# Patient Record
Sex: Male | Born: 1955
Health system: Southern US, Community
[De-identification: ages and names within clinical notes are randomized; demographics above are authoritative.]

## PROBLEM LIST (undated history)

## (undated) DIAGNOSIS — R42 Dizziness and giddiness: Secondary | ICD-10-CM

## (undated) DIAGNOSIS — C801 Malignant (primary) neoplasm, unspecified: Secondary | ICD-10-CM

## (undated) DIAGNOSIS — R32 Unspecified urinary incontinence: Secondary | ICD-10-CM

## (undated) DIAGNOSIS — I10 Essential (primary) hypertension: Secondary | ICD-10-CM

## (undated) DIAGNOSIS — M4800 Spinal stenosis, site unspecified: Secondary | ICD-10-CM

## (undated) DIAGNOSIS — M4802 Spinal stenosis, cervical region: Secondary | ICD-10-CM

## (undated) DIAGNOSIS — M5010 Cervical disc disorder with radiculopathy, unspecified cervical region: Secondary | ICD-10-CM

## (undated) DIAGNOSIS — E785 Hyperlipidemia, unspecified: Secondary | ICD-10-CM

## (undated) DIAGNOSIS — E119 Type 2 diabetes mellitus without complications: Secondary | ICD-10-CM

## (undated) DIAGNOSIS — G4730 Sleep apnea, unspecified: Secondary | ICD-10-CM

## (undated) HISTORY — PX: UMBILICAL HERNIA REPAIR: SHX196

## (undated) HISTORY — DX: Type 2 diabetes mellitus without complications: E11.9

## (undated) HISTORY — PX: PROSTATE SURGERY: SHX751

## (undated) HISTORY — PX: VASECTOMY: SHX75

## (undated) HISTORY — PX: TONSILLECTOMY: SUR1361

## (undated) HISTORY — PX: OTHER SURGICAL HISTORY: SHX169

---

## 2001-06-16 HISTORY — PX: UMBILICAL HERNIA REPAIR: SHX196

## 2002-03-04 ENCOUNTER — Encounter: Payer: Self-pay | Admitting: General Surgery

## 2002-03-04 ENCOUNTER — Encounter: Admission: RE | Admit: 2002-03-04 | Discharge: 2002-03-04 | Payer: Self-pay | Admitting: General Surgery

## 2002-03-08 ENCOUNTER — Ambulatory Visit (HOSPITAL_BASED_OUTPATIENT_CLINIC_OR_DEPARTMENT_OTHER): Admission: RE | Admit: 2002-03-08 | Discharge: 2002-03-08 | Payer: Self-pay | Admitting: General Surgery

## 2011-08-19 ENCOUNTER — Other Ambulatory Visit: Payer: Self-pay | Admitting: Urology

## 2011-10-14 ENCOUNTER — Encounter (HOSPITAL_COMMUNITY): Payer: Self-pay

## 2011-10-14 ENCOUNTER — Encounter (HOSPITAL_COMMUNITY)
Admission: RE | Admit: 2011-10-14 | Discharge: 2011-10-14 | Disposition: A | Discharge status: Home / Self Care | Payer: 59 | Source: Ambulatory Visit | Attending: Urology | Admitting: Urology

## 2011-10-14 ENCOUNTER — Encounter (HOSPITAL_COMMUNITY): Payer: Self-pay | Admitting: Pharmacy Technician

## 2011-10-14 ENCOUNTER — Ambulatory Visit (HOSPITAL_COMMUNITY)
Admission: RE | Admit: 2011-10-14 | Discharge: 2011-10-14 | Disposition: A | Discharge status: Home / Self Care | Payer: 59 | Source: Ambulatory Visit | Attending: Urology | Admitting: Urology

## 2011-10-14 DIAGNOSIS — C61 Malignant neoplasm of prostate: Secondary | ICD-10-CM | POA: Insufficient documentation

## 2011-10-14 DIAGNOSIS — G4730 Sleep apnea, unspecified: Secondary | ICD-10-CM | POA: Insufficient documentation

## 2011-10-14 DIAGNOSIS — I10 Essential (primary) hypertension: Secondary | ICD-10-CM | POA: Insufficient documentation

## 2011-10-14 DIAGNOSIS — Z01818 Encounter for other preprocedural examination: Secondary | ICD-10-CM | POA: Insufficient documentation

## 2011-10-14 DIAGNOSIS — Z01812 Encounter for preprocedural laboratory examination: Secondary | ICD-10-CM | POA: Insufficient documentation

## 2011-10-14 DIAGNOSIS — Z01810 Encounter for preprocedural cardiovascular examination: Secondary | ICD-10-CM | POA: Insufficient documentation

## 2011-10-14 HISTORY — DX: Hyperlipidemia, unspecified: E78.5

## 2011-10-14 HISTORY — DX: Sleep apnea, unspecified: G47.30

## 2011-10-14 HISTORY — DX: Malignant (primary) neoplasm, unspecified: C80.1

## 2011-10-14 HISTORY — DX: Essential (primary) hypertension: I10

## 2011-10-14 LAB — SURGICAL PCR SCREEN
MRSA, PCR: NEGATIVE
Staphylococcus aureus: POSITIVE — AB

## 2011-10-14 LAB — BASIC METABOLIC PANEL
Chloride: 96 mEq/L (ref 96–112)
Creatinine, Ser: 0.86 mg/dL (ref 0.50–1.35)
GFR calc Af Amer: >90 mL/min (ref >90)
GFR calc non Af Amer: >90 mL/min (ref >90)

## 2011-10-14 LAB — CBC
MCHC: 32.8 g/dL (ref 30.0–36.0)
MCV: 82.8 fL (ref 78.0–100.0)
Platelets: 273 10*3/uL (ref 150–400)
RDW: 14.2 % (ref 11.5–15.5)
WBC: 8.1 10*3/uL (ref 4.0–10.5)

## 2011-10-14 NOTE — Patient Instructions (Signed)
YOUR SURGERY IS SCHEDULED ON:  WED  5/8  AT 8:30 AM  REPORT TO [facility] AT:  6:30 AM      PHONE # FOR SHORT STAY IS [phone]  FOLLOW BOWEL PREP INSTRUCTIONS FROM DR. [id number]'N OFFICE FOR DAY BEFORE SURGERY.  DO NOT EAT OR DRINK ANYTHING AFTER MIDNIGHT THE NIGHT BEFORE YOUR SURGERY.  YOU MAY BRUSH YOUR TEETH, RINSE OUT YOUR MOUTH--BUT NO WATER, NO FOOD, NO CHEWING GUM, NO MINTS, NO CANDIES, NO CHEWING TOBACCO.  PLEASE TAKE THE FOLLOWING MEDICATIONS THE AM OF YOUR SURGERY WITH A FEW SIPS OF WATER:  METOPROLOL    IF YOU USE INHALERS--USE YOUR INHALERS THE AM OF YOUR SURGERY AND BRING INHALERS TO THE HOSPITAL -TAKE TO SURGERY.    IF YOU ARE DIABETIC:  DO NOT TAKE ANY DIABETIC MEDICATIONS THE AM OF YOUR SURGERY.  IF YOU TAKE INSULIN IN THE EVENINGS--PLEASE ONLY TAKE 1/2 NORMAL EVENING DOSE THE NIGHT BEFORE YOUR SURGERY.  NO INSULIN THE AM OF YOUR SURGERY.  IF YOU HAVE SLEEP APNEA AND USE CPAP OR BIPAP--PLEASE BRING THE MASK --NOT THE MACHINE-NOT THE TUBING   -JUST THE MASK. DO NOT BRING VALUABLES, MONEY, CREDIT CARDS.  CONTACT LENS, DENTURES / PARTIALS, GLASSES SHOULD NOT BE WORN TO SURGERY AND IN MOST CASES-HEARING AIDS WILL NEED TO BE REMOVED.  BRING YOUR GLASSES CASE, ANY EQUIPMENT NEEDED FOR YOUR CONTACT LENS. FOR PATIENTS ADMITTED TO THE HOSPITAL--CHECK OUT TIME THE DAY OF DISCHARGE IS 11:00 AM.  ALL INPATIENT ROOMS ARE PRIVATE - WITH BATHROOM, TELEPHONE, TELEVISION AND WIFI INTERNET. IF YOU ARE BEING DISCHARGED THE SAME DAY OF YOUR SURGERY--YOU CAN NOT DRIVE YOURSELF HOME--AND SHOULD NOT GO HOME ALONE BY TAXI OR BUS.  NO DRIVING OR OPERATING MACHINERY FOR 24 HOURS FOLLOWING ANESTHESIA / PAIN MEDICATIONS.                            SPECIAL INSTRUCTIONS:  CHLORHEXIDINE SOAP SHOWER (other brand names are Betasept and Hibiclens ) PLEASE SHOWER WITH CHLORHEXIDINE THE NIGHT BEFORE YOUR SURGERY AND THE AM OF YOUR SURGERY. DO NOT USE CHLORHEXIDINE ON YOUR FACE OR PRIVATE  AREAS--YOU MAY USE YOUR NORMAL SOAP THOSE AREAS AND YOUR NORMAL SHAMPOO.  WOMEN SHOULD AVOID SHAVING UNDER ARMS AND SHAVING LEGS 48 HOURS BEFORE USING CHLORHEXIDINE TO AVOID SKIN IRRITATION.  DO NOT USE IF ALLERGIC TO CHLORHEXIDINE.  PLEASE READ OVER ANY  FACT SHEETS THAT YOU WERE GIVEN: MRSA INFORMATION, BLOOD TRANSFUSION INFORMATION, INCENTIVE SPIROMETER INFORMATION.

## 2011-10-21 NOTE — Anesthesia Preprocedure Evaluation (Addendum)
Anesthesia Evaluation  Patient identified by MRN, date of birth, ID band Patient awake    Reviewed: Allergy & Precautions, H&P , NPO status , Patient's Chart, lab work & pertinent test results  History of Anesthesia Complications Negative for: history of anesthetic complications  Airway Mallampati: II TM Distance: >3 FB Neck ROM: Full    Dental  (+) Teeth Intact and Dental Advisory Given   Pulmonary sleep apnea ,  breath sounds clear to auscultation  Pulmonary exam normal       Cardiovascular hypertension, Pt. on medications and Pt. on home beta blockers Rhythm:Regular     Neuro/Psych negative neurological ROS  negative psych ROS   GI/Hepatic negative GI ROS, Neg liver ROS,   Endo/Other  Morbid obesity  Renal/GU negative Renal ROS  negative genitourinary   Musculoskeletal negative musculoskeletal ROS (+)   Abdominal   Peds negative pediatric ROS (+)  Hematology negative hematology ROS (+)   Anesthesia Other Findings   Reproductive/Obstetrics negative OB ROS                          Anesthesia Physical Anesthesia Plan  ASA: II  Anesthesia Plan: General   Post-op Pain Management:    Induction: Intravenous  Airway Management Planned: Oral ETT  Additional Equipment:   Intra-op Plan:   Post-operative Plan: Extubation in OR  Informed Consent: I have reviewed the patients History and Physical, chart, labs and discussed the procedure including the risks, benefits and alternatives for the proposed anesthesia with the patient or authorized representative who has indicated his/her understanding and acceptance.   Dental advisory given  Plan Discussed with: CRNA  Anesthesia Plan Comments:         Anesthesia Quick Evaluation

## 2011-10-22 ENCOUNTER — Encounter (HOSPITAL_COMMUNITY): Payer: Self-pay | Admitting: *Deleted

## 2011-10-22 ENCOUNTER — Encounter (HOSPITAL_COMMUNITY): Payer: Self-pay | Admitting: Anesthesiology

## 2011-10-22 ENCOUNTER — Inpatient Hospital Stay (HOSPITAL_COMMUNITY)
Admission: RE | Admit: 2011-10-22 | Discharge: 2011-10-26 | DRG: 708 | Disposition: A | Discharge status: Home / Self Care | Payer: 59 | Source: Ambulatory Visit | Attending: Urology | Admitting: Urology

## 2011-10-22 ENCOUNTER — Encounter (HOSPITAL_COMMUNITY)
Admission: RE | Disposition: A | Discharge status: Home / Self Care | Payer: Self-pay | Source: Ambulatory Visit | Attending: Urology

## 2011-10-22 ENCOUNTER — Ambulatory Visit (HOSPITAL_COMMUNITY): Payer: 59 | Admitting: Anesthesiology

## 2011-10-22 DIAGNOSIS — R110 Nausea: Secondary | ICD-10-CM | POA: Diagnosis not present

## 2011-10-22 DIAGNOSIS — R209 Unspecified disturbances of skin sensation: Secondary | ICD-10-CM | POA: Diagnosis not present

## 2011-10-22 DIAGNOSIS — E785 Hyperlipidemia, unspecified: Secondary | ICD-10-CM | POA: Diagnosis present

## 2011-10-22 DIAGNOSIS — I10 Essential (primary) hypertension: Secondary | ICD-10-CM | POA: Diagnosis present

## 2011-10-22 DIAGNOSIS — C61 Malignant neoplasm of prostate: Principal | ICD-10-CM | POA: Diagnosis present

## 2011-10-22 DIAGNOSIS — G4730 Sleep apnea, unspecified: Secondary | ICD-10-CM | POA: Diagnosis present

## 2011-10-22 DIAGNOSIS — R609 Edema, unspecified: Secondary | ICD-10-CM | POA: Diagnosis not present

## 2011-10-22 HISTORY — PX: ROBOT ASSISTED LAPAROSCOPIC RADICAL PROSTATECTOMY: SHX5141

## 2011-10-22 LAB — CBC
HCT: 40.9 % (ref 39.0–52.0)
MCH: 27.7 pg (ref 26.0–34.0)
MCV: 82.1 fL (ref 78.0–100.0)
Platelets: 266 10*3/uL (ref 150–400)
RBC: 4.98 MIL/uL (ref 4.22–5.81)
WBC: 17.2 10*3/uL — ABNORMAL HIGH (ref 4.0–10.5)

## 2011-10-22 LAB — BASIC METABOLIC PANEL
CO2: 27 mEq/L (ref 19–32)
Calcium: 8 mg/dL — ABNORMAL LOW (ref 8.4–10.5)
Chloride: 98 mEq/L (ref 96–112)
Glucose, Bld: 190 mg/dL — ABNORMAL HIGH (ref 70–99)
Sodium: 136 mEq/L (ref 135–145)

## 2011-10-22 SURGERY — ROBOTIC ASSISTED LAPAROSCOPIC RADICAL PROSTATECTOMY
Anesthesia: General | Site: Pelvis | Wound class: Clean Contaminated

## 2011-10-22 MED ORDER — LACTATED RINGERS IV SOLN
INTRAVENOUS | Status: DC | PRN
  Administered 2011-10-22 (×4): via INTRAVENOUS

## 2011-10-22 MED ORDER — SUFENTANIL CITRATE 50 MCG/ML IV SOLN
INTRAVENOUS | Status: DC | PRN
  Administered 2011-10-22: 10 ug via INTRAVENOUS
  Administered 2011-10-22 (×3): 5 ug via INTRAVENOUS
  Administered 2011-10-22: 10 ug via INTRAVENOUS
  Administered 2011-10-22 (×3): 5 ug via INTRAVENOUS

## 2011-10-22 MED ORDER — INDIGOTINDISULFONATE SODIUM 8 MG/ML IJ SOLN
INTRAMUSCULAR | Status: DC | PRN
  Administered 2011-10-22 (×2): 5 mL via INTRAVENOUS

## 2011-10-22 MED ORDER — ACETAMINOPHEN 10 MG/ML IV SOLN
INTRAVENOUS | Status: DC | PRN
  Administered 2011-10-22: 1000 mg via INTRAVENOUS

## 2011-10-22 MED ORDER — LACTATED RINGERS IV SOLN
INTRAVENOUS | Status: DC
  Administered 2011-10-22: 19:00:00 via INTRAVENOUS

## 2011-10-22 MED ORDER — HEPARIN SODIUM (PORCINE) 5000 UNIT/ML IJ SOLN
INTRAMUSCULAR | Status: AC
  Administered 2011-10-22: 5000 [IU] via SUBCUTANEOUS
  Filled 2011-10-22: qty 1

## 2011-10-22 MED ORDER — OXYBUTYNIN CHLORIDE 5 MG PO TABS
5.0000 mg | ORAL_TABLET | Freq: Three times a day (TID) | ORAL | Status: DC | PRN
  Administered 2011-10-23: 5 mg via ORAL
  Filled 2011-10-22: qty 1

## 2011-10-22 MED ORDER — ACETAMINOPHEN 10 MG/ML IV SOLN
1000.0000 mg | Freq: Four times a day (QID) | INTRAVENOUS | Status: AC
  Administered 2011-10-23 (×4): 1000 mg via INTRAVENOUS
  Filled 2011-10-22 (×4): qty 100

## 2011-10-22 MED ORDER — PROMETHAZINE HCL 25 MG/ML IJ SOLN: 6.2500 mg | INTRAMUSCULAR | Status: DC | PRN

## 2011-10-22 MED ORDER — HYOSCYAMINE SULFATE 0.125 MG SL SUBL
0.1250 mg | SUBLINGUAL_TABLET | SUBLINGUAL | Status: DC | PRN
  Administered 2011-10-22: 0.125 mg via SUBLINGUAL
  Filled 2011-10-22: qty 1

## 2011-10-22 MED ORDER — CEFAZOLIN SODIUM 1-5 GM-% IV SOLN
1.0000 g | Freq: Three times a day (TID) | INTRAVENOUS | Status: AC
  Administered 2011-10-23 (×2): 1 g via INTRAVENOUS
  Filled 2011-10-22 (×2): qty 50

## 2011-10-22 MED ORDER — MIDAZOLAM HCL 5 MG/5ML IJ SOLN
INTRAMUSCULAR | Status: DC | PRN
  Administered 2011-10-22: 2 mg via INTRAVENOUS

## 2011-10-22 MED ORDER — HEPARIN SODIUM (PORCINE) 5000 UNIT/ML IJ SOLN
5000.0000 [IU] | Freq: Three times a day (TID) | INTRAMUSCULAR | Status: DC
  Administered 2011-10-23 – 2011-10-26 (×10): 5000 [IU] via SUBCUTANEOUS
  Filled 2011-10-22 (×13): qty 1

## 2011-10-22 MED ORDER — CEFAZOLIN SODIUM-DEXTROSE 2-3 GM-% IV SOLR
INTRAVENOUS | Status: AC
  Filled 2011-10-22: qty 50

## 2011-10-22 MED ORDER — ONDANSETRON HCL 4 MG/2ML IJ SOLN
INTRAMUSCULAR | Status: DC | PRN
  Administered 2011-10-22: 4 mg via INTRAVENOUS

## 2011-10-22 MED ORDER — CISATRACURIUM BESYLATE (PF) 10 MG/5ML IV SOLN
INTRAVENOUS | Status: DC | PRN
  Administered 2011-10-22 (×3): 2 mg via INTRAVENOUS
  Administered 2011-10-22: 16 mg via INTRAVENOUS
  Administered 2011-10-22 (×2): 4 mg via INTRAVENOUS

## 2011-10-22 MED ORDER — HYDROMORPHONE HCL PF 1 MG/ML IJ SOLN
INTRAMUSCULAR | Status: DC | PRN
  Administered 2011-10-22: 0.5 mg via INTRAVENOUS
  Administered 2011-10-22: .5 mg via INTRAVENOUS
  Administered 2011-10-22 (×2): 0.5 mg via INTRAVENOUS

## 2011-10-22 MED ORDER — PROPOFOL 10 MG/ML IV EMUL
INTRAVENOUS | Status: DC | PRN
  Administered 2011-10-22: 150 mg via INTRAVENOUS

## 2011-10-22 MED ORDER — LISINOPRIL 20 MG PO TABS
20.0000 mg | ORAL_TABLET | Freq: Every day | ORAL | Status: DC
  Administered 2011-10-23 – 2011-10-25 (×3): 20 mg via ORAL
  Filled 2011-10-22 (×4): qty 1

## 2011-10-22 MED ORDER — GLYCOPYRROLATE 0.2 MG/ML IJ SOLN
INTRAMUSCULAR | Status: DC | PRN
  Administered 2011-10-22: 0.6 mg via INTRAVENOUS

## 2011-10-22 MED ORDER — HYDROCHLOROTHIAZIDE 25 MG PO TABS
25.0000 mg | ORAL_TABLET | Freq: Every day | ORAL | Status: DC
  Administered 2011-10-23 – 2011-10-25 (×3): 25 mg via ORAL
  Filled 2011-10-22 (×4): qty 1

## 2011-10-22 MED ORDER — SODIUM CHLORIDE 0.9 % IR SOLN
Status: DC | PRN
  Administered 2011-10-22: 1000 mL

## 2011-10-22 MED ORDER — MORPHINE SULFATE 2 MG/ML IJ SOLN
2.0000 mg | INTRAMUSCULAR | Status: DC | PRN
  Administered 2011-10-23 (×2): 2 mg via INTRAVENOUS
  Administered 2011-10-23 (×5): 4 mg via INTRAVENOUS
  Filled 2011-10-22: qty 1
  Filled 2011-10-22 (×7): qty 2
  Filled 2011-10-22: qty 1

## 2011-10-22 MED ORDER — SIMVASTATIN 20 MG PO TABS
20.0000 mg | ORAL_TABLET | Freq: Every day | ORAL | Status: DC
  Administered 2011-10-23 – 2011-10-25 (×3): 20 mg via ORAL
  Filled 2011-10-22 (×4): qty 1

## 2011-10-22 MED ORDER — FENTANYL CITRATE 0.05 MG/ML IJ SOLN
INTRAMUSCULAR | Status: AC
  Filled 2011-10-22: qty 2

## 2011-10-22 MED ORDER — STERILE WATER FOR IRRIGATION IR SOLN
Status: DC | PRN
  Administered 2011-10-22: 3000 mL

## 2011-10-22 MED ORDER — INDIGOTINDISULFONATE SODIUM 8 MG/ML IJ SOLN
INTRAMUSCULAR | Status: AC
  Filled 2011-10-22: qty 10

## 2011-10-22 MED ORDER — LISINOPRIL-HYDROCHLOROTHIAZIDE 20-25 MG PO TABS: 1.0000 | ORAL_TABLET | Freq: Every day | ORAL | Status: DC

## 2011-10-22 MED ORDER — METOPROLOL TARTRATE 50 MG PO TABS
50.0000 mg | ORAL_TABLET | ORAL | Status: AC
  Administered 2011-10-22: 50 mg via ORAL
  Filled 2011-10-22: qty 1

## 2011-10-22 MED ORDER — BUPIVACAINE LIPOSOME 1.3 % IJ SUSP
20.0000 mL | INTRAMUSCULAR | Status: AC
  Administered 2011-10-22: 20 mL
  Filled 2011-10-22: qty 20

## 2011-10-22 MED ORDER — OXYCODONE HCL 5 MG PO TABS
5.0000 mg | ORAL_TABLET | ORAL | Status: DC | PRN
  Administered 2011-10-23: 5 mg via ORAL
  Filled 2011-10-22: qty 1

## 2011-10-22 MED ORDER — ACETAMINOPHEN 10 MG/ML IV SOLN
INTRAVENOUS | Status: AC
  Filled 2011-10-22: qty 100

## 2011-10-22 MED ORDER — CEFAZOLIN SODIUM-DEXTROSE 2-3 GM-% IV SOLR
2.0000 g | Freq: Once | INTRAVENOUS | Status: AC
  Administered 2011-10-22: 2 g via INTRAVENOUS

## 2011-10-22 MED ORDER — FENTANYL CITRATE 0.05 MG/ML IJ SOLN
INTRAMUSCULAR | Status: DC | PRN
  Administered 2011-10-22 (×2): 50 ug via INTRAVENOUS

## 2011-10-22 MED ORDER — NEOSTIGMINE METHYLSULFATE 1 MG/ML IJ SOLN
INTRAMUSCULAR | Status: DC | PRN
  Administered 2011-10-22: 4 mg via INTRAVENOUS

## 2011-10-22 MED ORDER — SODIUM CHLORIDE 0.9 % IV BOLUS (SEPSIS): 1000.0000 mL | Freq: Once | INTRAVENOUS | Status: DC

## 2011-10-22 MED ORDER — SODIUM CHLORIDE 0.9 % IV SOLN
INTRAVENOUS | Status: DC
  Administered 2011-10-22 – 2011-10-23 (×3): via INTRAVENOUS

## 2011-10-22 MED ORDER — METOPROLOL TARTRATE 50 MG PO TABS
50.0000 mg | ORAL_TABLET | Freq: Two times a day (BID) | ORAL | Status: DC
  Administered 2011-10-23 – 2011-10-25 (×7): 50 mg via ORAL
  Filled 2011-10-22 (×9): qty 1

## 2011-10-22 MED ORDER — LACTATED RINGERS IR SOLN
Status: DC | PRN
  Administered 2011-10-22: 1000 mL

## 2011-10-22 MED ORDER — CISATRACURIUM BESYLATE (PF) 10 MG/5ML IV SOLN: INTRAVENOUS | Status: DC | PRN

## 2011-10-22 MED ORDER — BUPIVACAINE-EPINEPHRINE 0.25% -1:200000 IJ SOLN
INTRAMUSCULAR | Status: AC
  Filled 2011-10-22: qty 1

## 2011-10-22 MED ORDER — HEPARIN SODIUM (PORCINE) 5000 UNIT/ML IJ SOLN
5000.0000 [IU] | Freq: Once | INTRAMUSCULAR | Status: AC
  Administered 2011-10-22: 5000 [IU] via SUBCUTANEOUS

## 2011-10-22 MED ORDER — FENTANYL CITRATE 0.05 MG/ML IJ SOLN
25.0000 ug | INTRAMUSCULAR | Status: DC | PRN
  Administered 2011-10-22 (×2): 50 ug via INTRAVENOUS

## 2011-10-22 MED ORDER — LIDOCAINE HCL (CARDIAC) 20 MG/ML IV SOLN
INTRAVENOUS | Status: DC | PRN
  Administered 2011-10-22: 100 mg via INTRAVENOUS

## 2011-10-22 SURGICAL SUPPLY — 52 items
APPLIER CLIP 5 13 M/L LIGAMAX5 (MISCELLANEOUS) ×2
APR CLP MED LRG 5 ANG JAW (MISCELLANEOUS) ×1
BAG SPEC RTRVL LRG 6X4 10 (ENDOMECHANICALS) ×1
CANISTER SUCTION 2500CC (MISCELLANEOUS) ×2 IMPLANT
CATH ROBINSON RED A/P 8FR (CATHETERS) ×2 IMPLANT
CHLORAPREP W/TINT 26ML (MISCELLANEOUS) ×2 IMPLANT
CLIP APPLIE 5 13 M/L LIGAMAX5 (MISCELLANEOUS) ×1 IMPLANT
CLIP LIGATING HEM O LOK PURPLE (MISCELLANEOUS) ×4 IMPLANT
CLOTH BEACON ORANGE TIMEOUT ST (SAFETY) ×2 IMPLANT
CORD HIGH FREQUENCY UNIPOLAR (ELECTROSURGICAL) ×2 IMPLANT
COVER SURGICAL LIGHT HANDLE (MISCELLANEOUS) ×2 IMPLANT
COVER TIP SHEARS 8 DVNC (MISCELLANEOUS) ×1 IMPLANT
COVER TIP SHEARS 8MM DA VINCI (MISCELLANEOUS) ×1
CUTTER ECHEON FLEX ENDO 45 340 (ENDOMECHANICALS) ×2 IMPLANT
DECANTER SPIKE VIAL GLASS SM (MISCELLANEOUS) ×1 IMPLANT
DRAPE SURG IRRIG POUCH 19X23 (DRAPES) ×2 IMPLANT
DRSG TEGADERM 6X8 (GAUZE/BANDAGES/DRESSINGS) ×4 IMPLANT
ELECT REM PT RETURN 9FT ADLT (ELECTROSURGICAL) ×2
ELECTRODE REM PT RTRN 9FT ADLT (ELECTROSURGICAL) ×1 IMPLANT
EVACUATOR DRAINAGE 10X20 100CC (DRAIN) IMPLANT
EVACUATOR SILICONE 100CC (DRAIN) ×2
GLOVE BIO SURGEON STRL SZ 6.5 (GLOVE) ×2 IMPLANT
GLOVE BIOGEL M STRL SZ7.5 (GLOVE) ×1 IMPLANT
GLOVE BIOGEL PI IND STRL 7.0 (GLOVE) IMPLANT
GLOVE BIOGEL PI IND STRL 7.5 (GLOVE) ×2 IMPLANT
GLOVE BIOGEL PI INDICATOR 7.0 (GLOVE) ×1
GLOVE BIOGEL PI INDICATOR 7.5 (GLOVE) ×2
GLOVE ECLIPSE 7.0 STRL STRAW (GLOVE) ×4 IMPLANT
GLOVE SURG SS PI 6.5 STRL IVOR (GLOVE) ×4 IMPLANT
GOWN PREVENTION PLUS XLARGE (GOWN DISPOSABLE) ×5 IMPLANT
GOWN STRL NON-REIN LRG LVL3 (GOWN DISPOSABLE) ×7 IMPLANT
HEMOSTAT SURGICEL 4X8 (HEMOSTASIS) ×1 IMPLANT
HOLDER FOLEY CATH W/STRAP (MISCELLANEOUS) ×2 IMPLANT
NDL SAFETY ECLIPSE 18X1.5 (NEEDLE) ×1 IMPLANT
NEEDLE HYPO 18GX1.5 SHARP (NEEDLE) ×2
PACK ROBOT UROLOGY CUSTOM (CUSTOM PROCEDURE TRAY) ×2 IMPLANT
POSITIONER SURGICAL ARM (MISCELLANEOUS) ×2 IMPLANT
POUCH SPECIMEN RETRIEVAL 10MM (ENDOMECHANICALS) ×1 IMPLANT
RELOAD GREEN ECHELON 45 (STAPLE) ×2 IMPLANT
SCISSORS LAP 5X35 DISP (ENDOMECHANICALS) ×1 IMPLANT
SET TUBE IRRIG SUCTION NO TIP (IRRIGATION / IRRIGATOR) ×2 IMPLANT
SOLUTION ELECTROLUBE (MISCELLANEOUS) ×2 IMPLANT
SPONGE GAUZE 4X4 12PLY (GAUZE/BANDAGES/DRESSINGS) ×1 IMPLANT
SUT MNCRL 3 0 VIOLET RB1 (SUTURE) IMPLANT
SUT MONOCRYL 3 0 RB1 (SUTURE) ×1
SUT VIC AB 2-0 SH 27 (SUTURE) ×6
SUT VIC AB 2-0 SH 27X BRD (SUTURE) IMPLANT
SUT VICRYL 0 UR6 27IN ABS (SUTURE) ×6 IMPLANT
SYR 27GX1/2 1ML LL SAFETY (SYRINGE) ×2 IMPLANT
TOWEL OR 17X26 10 PK STRL BLUE (TOWEL DISPOSABLE) ×2 IMPLANT
TOWEL OR NON WOVEN STRL DISP B (DISPOSABLE) ×2 IMPLANT
WATER STERILE IRR 1500ML POUR (IV SOLUTION) ×4 IMPLANT

## 2011-10-22 NOTE — Op Note (Signed)
DATE OF PROCEDURE: 10/22/11   OPERATIVE REPORT  SURGEON: Natalia Leatherwood, MD  ASSISTANT: Crecencio Mc, MD; Pecola Leisure, [place]  PREOPERATIVE DIAGNOSIS: Prostate cancer.  POSTOPERATIVE DIAGNOSIS: Prostate cancer.  PROCEDURE PERFORMED: Robotic-assisted laparoscopic radical  Prostatectomy,bilateral NON-NERVE sparing approach.  ESTIMATED BLOOD LOSS: 500 cc.  DRAINS: Jackson-Pratt drain, left lower quadrant and Foley catheter.  SPECIMEN: Prostate sent with seminal vesicles, right lateral and left lateral portions of the prostate.  FINDINGS: Prostate adherent to rectum.  Marland Kitchen  HISTORY OF PRESENT ILLNESS: 56 year old male found to have prostate cancer following biopsy. He had 3+3=6 adenocarcinoma. After evaluation and discussion of management options, he elected for surgical extirpation of his prostate.   PROCEDURE IN DETAIL: Informed consent was obtained. He received subcutaneous heparin for DVT prophylaxis before being taken to the OR. The patient was taken to the operating room, where he was placed in the supine position. IV antibiotics were infused and general anesthesia was induced. A time- out was performed, in which the correct patient, surgical site, and procedure were identified and agreed upon by the team. Hair was removed  from his abdomen and genitals and then he was placed in a dorsal  lithotomy position. All pertinent neurovascular pressure points were padded appropriately. His arms were tucked to the side using gel padding. After this, his abdomen and genitals were prepped and draped in the usual sterile fashion. A Foley catheter was placed on the field, 10 cc of sterile water was placed into the balloon.   Access was gained for laparoscopic procedure by using the Hasson technique by cutting down to the fascia left lateral and inferior to the umbilicus due to a history of umbilical hernia repair with mesh. Once the peritoneum was accessed, a 10 mm port was placed and abdomen was insufflated  with carbon dioxide. Opening pressures were initially low so insufflation was continued. Next, markings were made for placement of the 1st & 3rd robotic arm ports in the usual areas with the ports being placed approximately 10 cm from the midline on either  side.  A 10 mm assistant port was placed on the far right lateral side of the abdomen. A 5 mm assistant port was placed between the right robotic (1st arm) and the camera port (2nd arm). The 4th robotic arm port was placed at the far left lateral side of the abdomen. All these were placed under direct visualization.   He was then placed in a Trendelenburg position, and the robot was docked to the robotic ports.  After this was done, there was lysis of adhesions of the sigmoid colon from the left sidewall of the pelvis. The urachal remnant was identified and divided and the bladder was  Dissected from the anterior wall of the abdomen. This was taken down to  the endopelvic fascia bilaterally and the perineum was split down to the  vas deferens bilaterally. The vas deferens was divided bipolar and monopolar electrocautery. After this was done, the prograsp was used to  retract the bladder cephalad. After this was done, the endopelvic fascia was incised  bilaterally and carried anterior and posteriorly bilaterally. The  puboprostatic ligaments were then divided and then the stapler device  was used to divide and ligate the dorsal venous complex.   After this was complete, attention was turned back to the junction between the prostate and bladder neck.  Dissection was carried down between the prostate and the bladder until  the Foley catheter was encountered. It was deflated and then  placed  through the dissection site and then anterior traction was placed by  grasping the catheter with the 4th robot arm. The remainder of the bladder neck  was then dissected out, and dissection was carried down posteriorly making sure to avoid reentry into the  bladder until the seminal vesicles were encountered.   The seminal vesicles and vas deferens were then dissected out completely bilaterally. After  these were done, they were placed on traction anteriorly and blunt  dissection was carried out between the rectum and the prostate. There was not a good clear surgical plain, so dissection was carried anteriorly in what was believed to be Denonvier's fascia.   Attention was turned to the right side. It was evaluated and found to have a good plane of dissection on the posterior lateral surface of the prostate, therefore a  nerve sparing technique was undertaken by incising medial to the neurovascular bundle and releasing it laterally. After this was carried  out, the right prostatic pedicle was controlled with sharp dissection  and Hem-o-lok clips. Dissection was attempted to be carried around posterior to  the prostate to the previous dissected area between the rectum and prostate, however it became evident that there was adherent tissue between the prostate and neurovascular bundle.  Attention was turned to the left side. It was evaluated and found to have a good plane of dissection on the posterior lateral surface of the prostate, therefore a  nerve sparing technique was undertaken by incising medial to the neurovascular bundle and releasing it laterally. After this was carried  out, the leftt prostatic pedicle was controlled with sharp dissection  and Hem-o-lok clips. Dissection was attempted to be carried around posterior to  the prostate to the previous dissected area between the rectum and prostate, however it became evident that there was adherent tissue between the prostate and neurovascular bundle.  Dissection around the posterior portion of the apex of the prostate due to the lack of surgical plane between the rectum and the prostate. A large majority of the prostate was already free and therefore this was removed by dividing the urethra and  the specimen was placed into a specimen bag.  Next I called in Dr. Laverle Patter who had previously assisted me with accessing the patient's abdomen in order to get his opinion regarding the lack of surgical plan. After gentle dissection on the posterior portion of the prostate we entered the perirectal fat and began sharp dissection of the remaining prostatic tissue towards the apex. After this was dissected to the apex attention was turned to the right lateral prosthetic pedicles and neurovascular bundles with remaining prostate tissue. The rectum was dissected away from this area and the prosthetic pedicles were ligated with Hem-o-lok clips and sharply divided. Sharp dissection was carried over this entire dissection as we knew we were very close to the rectum. The same was done on the left side. After this was done the pelvis was here dated with water and a rectal tube was pumped without any evidence of air in the surgical bed. There was good hemostasis in the surgical bed as well. This remaining prostatic tissue was sent separately as the right lateral prostate tissue and left lateral prostatic tissue.  Due to the large size of his prostate there was a large defect in the bladder neck. This appeared to be asymmetric with the right lateral bladder neck being larger. Interrupted Vicryl sutures were placed in a tennis racket fashion on the right lateral bladder neck making  sure that the mucosa was touching on both sides. This required approximately 4 interrupted sutures. After this, anastomosis of the bladder neck to the urethra was carried out. Double-armed Monocryl suture was used in a running fashion to perform the anastomosis. After this was done, a Foley  catheter was placed through the anastomosis and into the bladder. It was irrigated and found to have a small leak coming from the right side of the anastomosis. The running suture was tightened and found to have some redundancy. Dr. Laverle Patter assisted me in  placing a single interrupted Monocryl suture in the lateral anastomosis and this was tied to the redundant suture to create a water tight anastomosis. The suture needles were removed from the  body.   A Jackson-Pratt drain was placed through the port of the 4th robot arm. The specimen was placed in the EndoCatch bag. The robot was  undocked and the operating table was leveled. The EndoCatch bag was removed from the body by enlarging  the camera port. All ports were checked and found to be  free of bleeding. The 10 mm assistant port was closed with a suture  passer under direct visualization. The fascia of the umbilical port was close with interrupted 0-vicryl suture in a figure-of-eight fashion until there was no defect palpable. Care was taken to avoid incorporation of intra-abdominal contents into the closure. After this was done, the drain was sutured into place, and the local injection with Exparel was performed. All wounds were irrigated with  sterile normal saline and then closed with staples.  Tegaderm and drain gauze was placed over the Jackson-Pratt drain and  Foley catheter was placed to closed drainage. The patient was placed  back in supine position. Anesthesia was reversed. He was taken to PACU  in stable condition. He will be kept overnight for evaluation.

## 2011-10-22 NOTE — Anesthesia Postprocedure Evaluation (Signed)
Anesthesia Post Note  Patient: Adam Sutton  Procedure(s) Performed: Procedure(s) (LRB): ROBOTIC ASSISTED LAPAROSCOPIC RADICAL PROSTATECTOMY (N/A)  Anesthesia type: General  Patient location: PACU  Post pain: Pain level controlled  Post assessment: Post-op Vital signs reviewed  Last Vitals:  Filed Vitals:   10/22/11 1915  BP: 127/89  Pulse: 90  Temp:   Resp: 13    Post vital signs: Reviewed  Level of consciousness: sedated  Complications: No apparent anesthesia complications

## 2011-10-22 NOTE — Transfer of Care (Signed)
Immediate Anesthesia Transfer of Care Note  Patient: Adam Sutton  Procedure(s) Performed: Procedure(s) (LRB): ROBOTIC ASSISTED LAPAROSCOPIC RADICAL PROSTATECTOMY (N/A)  Patient Location: PACU  Anesthesia Type: General  Level of Consciousness: awake, alert  and oriented  Airway & Oxygen Therapy: Patient Spontanous Breathing and Patient connected to face mask oxygen  Post-op Assessment: Report given to PACU RN and Post -op Vital signs reviewed and stable  Post vital signs: Reviewed and stable  Complications: No apparent anesthesia complications

## 2011-10-22 NOTE — H&P (Signed)
Urology History and Physical Exam  CC: Prostate cancer  HPI: 56 year old male with PSA 5.32 which triggered a prostate biopsy. This revealed prostate adenocarcinoma, 3+3=6 in one core in August 2012. Confirmatory biopsy revealed 3+3=6 in 4 cores. Due to the increase in volume, he has elected to have treatment. After discussing options he presents today for robot assisted laparoscopic radical prostatectomy; nerve salvage will be determined at the time of surgery.  We have discussed the risks, benefits, alternatives, and likelihood of achieving his goals.  His TRUS volume was 81cc; SHIM score was 6.  He has a history of umbilical hernia repair with mesh by Dr. Johna Sheriff.   PMH: Past Medical History  Diagnosis Date  . Hypertension   . Lipidemia   . Cancer     prostate cancer  . Sleep apnea 10/14/11    STOP BANG SCORE 4    PSH: Past Surgical History  Procedure Date  . Umbilical hernia repair   . 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 Marital Status: Married    Spouse Name: N/A    Number of Children: N/A  . Years of Education: N/A   Occupational History  . Not on file.   Social History Main Topics  . Smoking status: Never Smoker   . Smokeless tobacco: Never Used  . Alcohol Use: Yes     OCCAS BEER  . Drug Use: No  . Sexually Active:    Other Topics Concern  . Not on file   Social History Narrative  . No narrative on file    Family History: No family history on file.  Review of Systems: Positive: Urgency. Negative: Chest pain, SOB, fever.  A further 10 point review of systems was negative except what is listed in the HPI.  Physical Exam:  General: No acute distress.  Awake. Head:  Normocephalic.  Atraumatic. ENT:  EOMI.  Mucous membranes moist Neck:  Supple.  No  lymphadenopathy. CV:  S1 present. S2 present. Regular rate. Pulmonary: Equal effort bilaterally.  Clear to auscultation bilaterally. Abdomen: Soft.  Non- tender to palpation. Skin:  Normal turgor.  No visible rash. Extremity: No gross deformity of bilateral upper extremities.  No gross deformity of    bilateral lower extremities. Neurologic: Alert. Appropriate mood.    Studies:  No results found for this basename: HGB:2,WBC:2,PLT:2 in the last 72 hours  No results found for this basename: NA:2,K:2,CL:2,CO2:2,BUN:2,CREATININE:2,CALCIUM:2,MAGNESIUM:2,GFRNONAA:2,GFRAA:2 in the last 72 hours   No results found for this basename: PT:2,INR:2,APTT:2 in the last 72 hours   No components found with this basename: ABG:2    Assessment:  Prostate cancer  Plan: -Robot assisted laparoscopic radical prostatectomy -Possible conversion to open if umbilical mesh presents a problem.

## 2011-10-23 ENCOUNTER — Encounter (HOSPITAL_COMMUNITY): Payer: Self-pay | Admitting: Urology

## 2011-10-23 LAB — CBC
HCT: 40.1 % (ref 39.0–52.0)
MCH: 27.3 pg (ref 26.0–34.0)
MCHC: 32.9 g/dL (ref 30.0–36.0)
MCV: 82.9 fL (ref 78.0–100.0)
RDW: 14.5 % (ref 11.5–15.5)

## 2011-10-23 LAB — BASIC METABOLIC PANEL
BUN: 14 mg/dL (ref 6–23)
CO2: 30 mEq/L (ref 19–32)
Chloride: 98 mEq/L (ref 96–112)
Creatinine, Ser: 0.93 mg/dL (ref 0.50–1.35)
Glucose, Bld: 135 mg/dL — ABNORMAL HIGH (ref 70–99)

## 2011-10-23 MED ORDER — BISACODYL 10 MG RE SUPP
10.0000 mg | Freq: Two times a day (BID) | RECTAL | Status: DC
  Administered 2011-10-23 – 2011-10-25 (×5): 10 mg via RECTAL
  Filled 2011-10-23 (×5): qty 1

## 2011-10-23 NOTE — Progress Notes (Signed)
   CARE MANAGEMENT NOTE 10/23/2011  Patient:  Adam Sutton,Adam Sutton   Account Number:  [id number]  Date Initiated:  10/23/2011  Documentation initiated by:  Jiles Crocker  Subjective/Objective Assessment:   ADMITTED FOR ROBOTIC ASSISTED LAP/ PROSTATECTOMY     Action/Plan:   LIVES AT HOME WITH SPOUSE/ INDEPENDNET PRIOR TO ADMISSION   Anticipated DC Date:  10/24/2011   Anticipated DC Plan:  HOME/SELF CARE           Status of service:  In process, will continue to follow Medicare Important Message given?   (If response is "NO", the following Medicare IM given date fields will be blank) :    Discharge Disposition:  HOME  Per UR Regulation:  Reviewed for med. necessity/level of care/duration of stay  Comments:  10/23/2011- B Demarion Pondexter RN, BSN, MHA

## 2011-10-23 NOTE — Progress Notes (Signed)
Urology Progress Note  Subjective:     No acute urologic events overnight. Complains of abdominal pain controlled with pain medication. No flatus or BM. Positive ambulation. Complains of bilateral upper extremity paresthesia from the the distal aspect of the deltoid distally as well as subjective weakness of both arms.  I explained the details of surgery and the findings with the patient.  ROS: Negative: Chest pain  Objective:  Patient Vitals for the past 24 hrs:  BP Temp Temp src Pulse Resp SpO2 Height Weight  10/23/11 0526 131/80 mmHg 99.2 F (37.3 C) Oral 84  18  98 % - -  10/23/11 0037 137/89 mmHg - - 92  - - - -  10/22/11 2034 147/87 mmHg 98 F (36.7 C) - 96  16  98 % - -  10/22/11 2031 - - - - - - 5\' 8"  (1.727 m) 113.2 kg (249 lb 9 oz)  10/22/11 2000 149/91 mmHg 99.3 F (37.4 C) - 93  15  97 % - -  10/22/11 1945 124/86 mmHg - - 92  14  97 % - -  10/22/11 1930 134/74 mmHg - - 90  11  96 % - -  10/22/11 1915 127/89 mmHg - - 90  13  98 % - -  10/22/11 1900 135/88 mmHg 99.3 F (37.4 C) - 91  14  98 % - -  10/22/11 1845 144/89 mmHg - - 89  13  97 % - -  10/22/11 1830 137/88 mmHg - - 91  15  100 % - -  10/22/11 1822 155/80 mmHg 100.2 F (37.9 C) - 94  13  100 % - -    Physical Exam: General:  No acute distress, awake Cardiovascular:    [x]   S1/S2 present, RRR  []   Irregularly irregular Chest:  CTA-B Abdomen:               []  Soft, appropriately TTP  []  Soft, NTTP  [x]  Soft, appropriately TTP, incision(s) dressed, JP serosanguinous Extremity:  BUE equal strength and 4-5/5 both proximally & distally with grip 4-5/5. Mild BUE edema. Genitourinary: Foley in place and draining appropriately. Foley:  Clear yellow urine.    I/O last 3 completed shifts: In: 3400 [I.V.:3400] Out: 500 [Blood:500]  Output (11 hours): Foley: 570 mL JP: 10 mL  Recent Labs  Basename 10/23/11 0500 10/22/11 1918   HGB 13.2 13.8   WBC 13.8* 17.2*   PLT 241 266    Recent Labs  Basename  10/23/11 0500 10/22/11 1918   NA 135 136   K 3.7 3.8   CL 98 98   CO2 30 27   BUN 14 17   CREATININE 0.93 1.03   CALCIUM 8.2* 8.0*   GFRNONAA >90 79*   GFRAA >90 >90     No results found for this basename: PT:2,INR:2,APTT:2 in the last 72 hours   No components found with this basename: ABG:2    Length of stay: 1 days.  Assessment: Prostate cancer. POD#1 Robot- assisted laparoscopic prostatectomy.  Bilateral UE paresthesias.   Plan: -Continue IV fluid. -Continue ambulation. -Paresthesias should resolve with time and decreased edema. -Bowel regiment; pulmonary toilet with IS. -Likely continue to observe today.   Natalia Leatherwood, MD [phone]

## 2011-10-24 MED ORDER — CIPROFLOXACIN HCL 500 MG PO TABS: 500.0000 mg | ORAL_TABLET | Freq: Two times a day (BID) | ORAL | Status: AC

## 2011-10-24 MED ORDER — OXYCODONE HCL 5 MG PO TABS: 5.0000 mg | ORAL_TABLET | ORAL | Status: AC | PRN

## 2011-10-24 MED ORDER — BISACODYL 10 MG RE SUPP: 10.0000 mg | RECTAL | Status: AC | PRN

## 2011-10-24 MED ORDER — SENNOSIDES-DOCUSATE SODIUM 8.6-50 MG PO TABS: 1.0000 | ORAL_TABLET | Freq: Two times a day (BID) | ORAL | Status: AC

## 2011-10-24 MED ORDER — BACITRACIN-NEOMYCIN-POLYMYXIN 400-5-5000 EX OINT: TOPICAL_OINTMENT | Freq: Three times a day (TID) | CUTANEOUS | Status: AC

## 2011-10-24 MED ORDER — OXYBUTYNIN CHLORIDE 5 MG PO TABS: 5.0000 mg | ORAL_TABLET | Freq: Three times a day (TID) | ORAL | Status: DC | PRN

## 2011-10-24 MED ORDER — SENNOSIDES-DOCUSATE SODIUM 8.6-50 MG PO TABS
1.0000 | ORAL_TABLET | Freq: Two times a day (BID) | ORAL | Status: DC
  Administered 2011-10-24 – 2011-10-25 (×3): 1 via ORAL
  Filled 2011-10-24 (×6): qty 1

## 2011-10-24 MED ORDER — HYOSCYAMINE SULFATE 0.125 MG SL SUBL: 0.1250 mg | SUBLINGUAL_TABLET | SUBLINGUAL | Status: DC | PRN

## 2011-10-24 NOTE — Progress Notes (Signed)
Urology Progress Note  Subjective:     No acute urologic events overnight. Complains of bowel gas and cramping relieved with passage of flatus. Positive nausea overnight without emesis. Positive flatus and small amount of mucous per rectum.  Positive ambulation.Bilateral upper extremity paresthesia improved compared to yesterday. Subjective weakness of both arms also improved; objectively he has good strength. Still on clear liquids.     ROS: Negative: Chest pain  Objective:  Patient Vitals for the past 24 hrs:  BP Temp Temp src Pulse Resp SpO2  10/24/11 0458 138/89 mmHg 98.5 F (36.9 C) Oral 73  19  98 %  10/23/11 2309 - - - 98  - -  10/23/11 2147 154/89 mmHg 98.4 F (36.9 C) Oral 77  20  96 %  10/23/11 1900 142/91 mmHg 98.4 F (36.9 C) Tympanic 69  19  95 %  10/23/11 1516 127/85 mmHg 98.6 F (37 C) Oral 70  20  98 %  10/23/11 1038 131/76 mmHg 98.6 F (37 C) Oral 77  19  96 %    Physical Exam: General:  No acute distress, awake Cardiovascular:    [x]   S1/S2 present, RRR  []   Irregularly irregular Chest:  CTA-B Abdomen:               []  Soft, appropriately TTP  []  Soft, NTTP  [x]  Soft, appropriately TTP, moderate distention, incision(s) clean/dry/intact, JP serosanguinous Extremity:  BUE equal strength and 4-5/5 both proximally & distally with grip 4-5/5. Mild BUE edema. Paresthesia improved and now from the elbow distally, bilaterally. Genitourinary: Foley in place and draining appropriately. Foley:  Clear yellow urine.    I/O last 3 completed shifts: In: 2028 [P.O.:440; I.V.:1125; Other:13; IV Piggyback:450] Out: 4445 [Urine:4395; Drains:50]  Urine Output: Foley: 3125 mL JP: 22 mL (28 mL)  Recent Labs  Basename 10/23/11 0500 10/22/11 1918   HGB 13.2 13.8   WBC 13.8* 17.2*   PLT 241 266    Recent Labs  Basename 10/23/11 0500 10/22/11 1918   NA 135 136   K 3.7 3.8   CL 98 98   CO2 30 27   BUN 14 17   CREATININE 0.93 1.03   CALCIUM 8.2* 8.0*   GFRNONAA  >90 79*   GFRAA >90 >90     No results found for this basename: PT:2,INR:2,APTT:2 in the last 72 hours   No components found with this basename: ABG:2    Length of stay: 2 days.  Assessment: Prostate cancer. POD#2 Robot- assisted laparoscopic prostatectomy.  Bilateral UE paresthesias.   Plan: -Saline lock IV. -Continue ambulation. -Paresthesias: improving. Should continue to resolve with time and decreased edema. -Bowel regiment; pulmonary toilet with IS. -Continue clear liquid diet until bowel activity becomes more regular. -Disposition: awaiting bowel function and ability to tolerate regular diet.   Natalia Leatherwood, MD [phone]

## 2011-10-25 MED ORDER — METOCLOPRAMIDE HCL 5 MG/ML IJ SOLN
5.0000 mg | Freq: Three times a day (TID) | INTRAMUSCULAR | Status: DC
  Administered 2011-10-25 – 2011-10-26 (×3): 5 mg via INTRAVENOUS
  Filled 2011-10-25: qty 1
  Filled 2011-10-25: qty 2
  Filled 2011-10-25 (×2): qty 1
  Filled 2011-10-25 (×2): qty 2

## 2011-10-25 MED ORDER — ONDANSETRON HCL 4 MG/2ML IJ SOLN
4.0000 mg | Freq: Three times a day (TID) | INTRAMUSCULAR | Status: DC | PRN
  Administered 2011-10-25: 4 mg via INTRAVENOUS
  Filled 2011-10-25: qty 2

## 2011-10-25 NOTE — Progress Notes (Signed)
3 Days Post-Op Subjective: Patient reports some flatus, but he still has nausea and distention.  Objective: Vital signs in last 24 hours: Temp:  [98.2 F (36.8 C)-98.6 F (37 C)] 98.6 F (37 C) (05/11 0545) Pulse Rate:  [71-84] 76  (05/11 0545) Resp:  [19-20] 20  (05/11 0545) BP: (129-152)/(81-91) 138/91 mmHg (05/11 0545) SpO2:  [96 %] 96 % (05/11 0545)  Intake/Output from previous day: 05/10 0701 - 05/11 0700 In: 1520 [P.O.:1520] Out: 3341 [Urine:3325; Drains:16] Intake/Output this shift:    Physical Exam:  Constitutional: Vital signs reviewed. WD WN in NAD   Abdomen is still distended. Slightly tympanitic. No significant tenderness. Incisions appear to be healing well. Bowel sounds are active.  Lab Results:  Basename 10/23/11 0500 10/22/11 1918  HGB 13.2 13.8  HCT 40.1 40.9   BMET  Basename 10/23/11 0500 10/22/11 1918  NA 135 136  K 3.7 3.8  CL 98 98  CO2 30 27  GLUCOSE 135* 190*  BUN 14 17  CREATININE 0.93 1.03  CALCIUM 8.2* 8.0*   No results found for this basename: LABPT:3,INR:3 in the last 72 hours No results found for this basename: LABURIN:1 in the last 72 hours Results for orders placed during the hospital encounter of 10/14/11  SURGICAL PCR SCREEN     Status: Abnormal   Collection Time   10/14/11  1:32 PM      Component Value Range Status Comment   MRSA, PCR NEGATIVE  NEGATIVE  Final    Staphylococcus aureus POSITIVE (*) NEGATIVE  Final     Studies/Results: No results found.  Assessment/Plan:   Postop day #3 robotic assisted laparoscopic radical prostatectomy. He is having slow return of bowel function. He seems to be progressing, however.    We will advance his diet as tolerated. He was given ondansetron for  recent nausea. I've also started him on Reglan. We will discontinue his JP drain.   LOS: 3 days   Marcine Matar M 10/25/2011, 8:52 AM

## 2011-10-26 NOTE — Discharge Instructions (Signed)
DISCHARGE INSTRUCTIONS FOR RADICAL PROSTATECTOMY  MEDICATIONS: patient is discharged with:  1. Hydrocodone*** Oxycodone/acetaminophen 5/325mg  - Take 1-2 by mouth every 4-6 hours as needed for pain - DO NOT  TAKE WITH ALCOHOL, DO NOT EXCEED >4GM OF TYLENOL (ACETAMINOPHEN) IN 24 HR PERIOD.  2. Senokot S - Take 1 by mouth two times per day, hold for loose stools  3. Oxybutynin 5mg  - take one every 8 hours by mouth- for bladder spasms.  4. Levsin 0.125 mg- take one under your tongue every 4 hours as needed for  bladder spasms.  5. ***- this is an antibiotic.  Take this beginning the day before your catheter removal.  6. Bacitracin ointment - place on the tip of your penis three times daily while  catheter is in place.  7. Miralax 17 grams - take daily with 8 oz of fluid for your bowels.  8. DO NOT RESUME ASPIRIN, or any other medicines like ibuprofen, motrin,  excedrin, advil, aleve, vitamin E, fish oil as these can all cause bleeding  for one week.  9. Resume all your other meds from home - except do not take any other pain meds  that you may have at home.  ACTIVITY 1. No heavy lifting >10 pounds for 6 weeks 2. No sexual activity for 6 weeks 3. No strenuous activity for 6 weeks 4. No driving while on narcotic pain medications 5. Drink plenty of water 6. Continue to walk at home - you can still get blood clots when you are at  home, so keep active, but don't over do it. 7. You may resume normal activity in 2 wks (working but no heavy lifting) 8. DO NOT STRAIN TO HAVE A BOWEL MOVEMENT.  DIET 1. Go slow with your diet. You had a big surgery and your appetite will not be  as it was before surgery for quite some time. You may also notice a metallic  taste in your mouth, this should go away in a few weeks. Make sure you stay well  hydrated and drink lots of water. You can buy Boost or Ensure supplements to  drink between meals and any other time as well to make sure you are getting   adequate nutrition. 2. Do not eat a lot of heavy meals (ex hamburgers, steak) right away - you may  get sick to your stomach. 3. Do not strain to have a bowel movement.  If you have not been able to have a  BM after several day, go to your local pharmacy and obtain Mild of Magnesia and  take it as instructed on the bottle.   BATHING/WOUND CARE 1. You can shower and we recommend daily showers.  If you have steri strips over your  incision will fall off on their own in a week or so.  If you have staples, they will be removed when your catheter is removed.  2. The JP site will close up on its own in a few days - replace the 4x4 gauze if  it becomes saturated. 3. You do not need to dress your surgical wounds.  Let the steri-strips fall off  on their own. 4. DO NOT SUBMERGE YOUR CATHETER OR WOUND UNDER WATER.  CATHETER 1. Keep your catheter secured to your leg at all times with tape. 2. You may experience leakage of urine around your catheter- as long as the  catheter continues to drain, this is normal.  If your catheter stops draining  return to the ER,  but do not let anyone other than a urologist replace your  catheter. 3. You may also have blood in your urine, even after it has been clear for  several days; you may even pass some small blood clots or other material.  This  is normal as well.  If this happens, sit down and drink plenty of water to help  make urine to flush out your bladder.  If the blood in your urine becomes worse  after doing this, contact our office or return to the ER. 4. You may use the leg bag (small bag) during the day, but use the large bag at  night.    SIGNS/SYMPTOMS TO CALL: 1. Please call us if you have a fever greater than 101.5, uncontrolled  nausea/vomiting, uncontrolled pain, dizziness, unable to urinate,   chest pain, shortness of breath, leg swelling, leg pain, or any other concerns  or questions.  You can reach [place] at [phone].

## 2011-10-26 NOTE — Discharge Summary (Signed)
Patient ID: Adam Sutton MRN: [id number] DOB/AGE: December 19, 1955 56 y.o.  Admit date: 10/22/2011 Discharge date: 10/26/2011  Primary Care Physician:  Louie Boston, MD, MD  Discharge Diagnoses:   Adenocarcinoma of the prostate  Consults: None  Discharge Medications: Medication List  As of 10/26/2011  8:02 AM   TAKE these medications         bisacodyl 10 MG suppository   Commonly known as: DULCOLAX   Place 1 suppository (10 mg total) rectally as needed for constipation.      ciprofloxacin 500 MG tablet   Commonly known as: CIPRO   Take 1 tablet (500 mg total) by mouth 2 (two) times daily. Begin this medication the day before your catheter is scheduled to be removed.      hyoscyamine 0.125 MG SL tablet   Commonly known as: LEVSIN SL   Place 1 tablet (0.125 mg total) under the tongue every 4 (four) hours as needed.      lisinopril-hydrochlorothiazide 20-25 MG per tablet   Commonly known as: PRINZIDE,ZESTORETIC   Take 1 tablet by mouth daily before breakfast.      metoprolol 50 MG tablet   Commonly known as: LOPRESSOR   Take 50 mg by mouth 2 (two) times daily.      neomycin-bacitracin-polymyxin ointment   Commonly known as: NEOSPORIN   Apply topically 3 (three) times daily. apply to tip of penis.      oxybutynin 5 MG tablet   Commonly known as: DITROPAN   Take 1 tablet (5 mg total) by mouth every 8 (eight) hours as needed (bladder spasms).      oxyCODONE 5 MG immediate release tablet   Commonly known as: Oxy IR/ROXICODONE   Take 1-2 tablets (5-10 mg total) by mouth every 4 (four) hours as needed for pain.      pravastatin 40 MG tablet   Commonly known as: PRAVACHOL   Take 40 mg by mouth at bedtime.      senna-docusate 8.6-50 MG per tablet   Commonly known as: Senokot-S   Take 1 tablet by mouth 2 (two) times daily.             Significant Diagnostic Studies:  Dg Chest 2 View  10/14/2011  *RADIOLOGY REPORT*  Clinical Data: Preop for robotic prostatectomy.   Hypertension. Nonsmoker.  CHEST - 2 VIEW  Comparison: None.  Findings: Midline trachea.  Normal heart size and mediastinal contours. No pleural effusion or pneumothorax.  Low lung volumes. Mild scarring or atelectasis radiates from the right hilum on the frontal view.  Left lung is clear.  IMPRESSION: Low lung volumes, without acute disease.  Original Report Authenticated By: Consuello Bossier, M.D.    Brief H and P: For complete details please refer to admission H and P, but in brief the patient was admitted for robotic assisted laparoscopic radical prostatectomy as primary therapy for recently diagnosed adenocarcinoma prostate.  Hospital Course:  Active Problems:  * No active hospital problems. *    Day of Discharge BP 128/84  Pulse 85  Temp(Src) 99.1 F (37.3 C) (Oral)  Resp 19  Ht 5\' 8"  (1.727 m)  Wt 104.6 kg (230 lb 9.6 oz)  BMI 35.06 kg/m2  SpO2 94%  No results found for this or any previous visit (from the past 24 hour(s)).  Physical Exam: General: Alert and awake oriented x3 not in any acute distress. HEENT: anicteric sclera, pupils reactive to light and accommodation CVS: S1-S2 clear no murmur rubs or gallops Chest: clear  to auscultation bilaterally, no wheezing rales or rhonchi Abdomen: Incisions were healing well. There was minimal distention and no significant tenderness.  Disposition: Home  Diet: No restrictions  Activity: Discussed with patient, gradually increase   Disposition and Follow-up:   Followup with Dr. Margarita Grizzle as scheduled  TESTS THAT NEED FOLLOW-UP None  DISCHARGE FOLLOW-UP Follow-up Information    Follow up with Milford Cage, MD on 11/04/2011. (Cystogram at 9:30, appointment at 10:00 am)    Contact information:   [id number] [facility] [place] [address] [phone]          Time spent on Discharge: 15 minutes  Signed: Chelsea Aus 10/26/2011, 8:02 AM

## 2011-10-26 NOTE — Progress Notes (Signed)
10-26-11 NSG:  Pt reports he has had at least 9 small to medium liquid brown stools in the past 24 hours.  Did not take po Sennakot tonight at HS.  Is walking halls frequently, denies pain and nausea, urine remains clear yellow to amber no clots noted.  Has had occassional leaking around his foley when ambulating, u/o is QS

## 2016-09-08 DIAGNOSIS — Z0000 Encounter for general adult medical examination without abnormal findings: Secondary | ICD-10-CM | POA: Diagnosis not present

## 2016-09-08 DIAGNOSIS — E119 Type 2 diabetes mellitus without complications: Secondary | ICD-10-CM | POA: Diagnosis not present

## 2016-09-08 DIAGNOSIS — I10 Essential (primary) hypertension: Secondary | ICD-10-CM | POA: Diagnosis not present

## 2017-01-05 DIAGNOSIS — L858 Other specified epidermal thickening: Secondary | ICD-10-CM | POA: Diagnosis not present

## 2017-01-05 DIAGNOSIS — L03012 Cellulitis of left finger: Secondary | ICD-10-CM | POA: Diagnosis not present

## 2017-01-20 DIAGNOSIS — I781 Nevus, non-neoplastic: Secondary | ICD-10-CM | POA: Diagnosis not present

## 2017-01-20 DIAGNOSIS — B078 Other viral warts: Secondary | ICD-10-CM | POA: Diagnosis not present

## 2017-01-20 DIAGNOSIS — D225 Melanocytic nevi of trunk: Secondary | ICD-10-CM | POA: Diagnosis not present

## 2017-06-29 ENCOUNTER — Ambulatory Visit: Payer: Self-pay | Admitting: Family Medicine

## 2017-08-06 DIAGNOSIS — H8309 Labyrinthitis, unspecified ear: Secondary | ICD-10-CM | POA: Diagnosis not present

## 2017-08-06 DIAGNOSIS — R110 Nausea: Secondary | ICD-10-CM | POA: Diagnosis not present

## 2017-08-17 ENCOUNTER — Encounter: Payer: Self-pay | Admitting: Family Medicine

## 2017-08-17 ENCOUNTER — Ambulatory Visit (INDEPENDENT_AMBULATORY_CARE_PROVIDER_SITE_OTHER): Payer: 59 | Admitting: Family Medicine

## 2017-08-17 VITALS — BP 138/87 | HR 78 | Temp 97.3°F | Ht 68.0 in | Wt 250.0 lb

## 2017-08-17 DIAGNOSIS — Z9079 Acquired absence of other genital organ(s): Secondary | ICD-10-CM

## 2017-08-17 DIAGNOSIS — E785 Hyperlipidemia, unspecified: Secondary | ICD-10-CM | POA: Insufficient documentation

## 2017-08-17 DIAGNOSIS — Z0000 Encounter for general adult medical examination without abnormal findings: Secondary | ICD-10-CM | POA: Diagnosis not present

## 2017-08-17 DIAGNOSIS — E119 Type 2 diabetes mellitus without complications: Secondary | ICD-10-CM | POA: Diagnosis not present

## 2017-08-17 DIAGNOSIS — E782 Mixed hyperlipidemia: Secondary | ICD-10-CM

## 2017-08-17 DIAGNOSIS — I10 Essential (primary) hypertension: Secondary | ICD-10-CM

## 2017-08-17 DIAGNOSIS — N39498 Other specified urinary incontinence: Secondary | ICD-10-CM

## 2017-08-17 LAB — URINALYSIS
Bilirubin, UA: NEGATIVE
GLUCOSE, UA: NEGATIVE
LEUKOCYTES UA: NEGATIVE
Nitrite, UA: NEGATIVE
SPEC GRAV UA: 1.025 (ref 1.005–1.030)
Urobilinogen, Ur: 0.2 mg/dL (ref 0.2–1.0)
pH, UA: 5.5 (ref 5.0–7.5)

## 2017-08-17 LAB — BAYER DCA HB A1C WAIVED: HB A1C: 6.9 % (ref <7.0)

## 2017-08-17 MED ORDER — BENZONATATE 200 MG PO CAPS: 200.0000 mg | ORAL_CAPSULE | Freq: Three times a day (TID) | ORAL | 0 refills | Status: DC | PRN

## 2017-08-17 MED ORDER — PRAVASTATIN SODIUM 40 MG PO TABS: 40.0000 mg | ORAL_TABLET | Freq: Every day | ORAL | 5 refills | Status: DC

## 2017-08-17 MED ORDER — GLIMEPIRIDE 2 MG PO TABS: 2.0000 mg | ORAL_TABLET | Freq: Every day | ORAL | 5 refills | Status: DC

## 2017-08-17 MED ORDER — LEVOFLOXACIN 500 MG PO TABS: 500.0000 mg | ORAL_TABLET | Freq: Every day | ORAL | 0 refills | Status: DC

## 2017-08-17 MED ORDER — METFORMIN HCL 500 MG PO TABS: 500.0000 mg | ORAL_TABLET | Freq: Two times a day (BID) | ORAL | 5 refills | Status: DC

## 2017-08-17 MED ORDER — METOPROLOL TARTRATE 50 MG PO TABS: 50.0000 mg | ORAL_TABLET | Freq: Two times a day (BID) | ORAL | 5 refills | Status: DC

## 2017-08-17 MED ORDER — LISINOPRIL-HYDROCHLOROTHIAZIDE 20-25 MG PO TABS: 1.0000 | ORAL_TABLET | Freq: Every day | ORAL | 5 refills | Status: DC

## 2017-08-17 NOTE — Progress Notes (Signed)
Subjective:  Patient ID: Adam Sutton,  male    DOB: 04-23-1956  Age: 62 y.o.    CC: New Patient (Initial Visit) (pt here today to establish care )   HPI Adam Sutton presents for  follow-up of hypertension. Patient has no history of headache chest pain or shortness of breath or recent cough. Patient also denies symptoms of TIA such as numbness weakness lateralizing. Patient denies side effects from medication. States taking it regularly.  Patient also  in for follow-up of elevated cholesterol. Doing well without complaints on current medication. Denies side effects of statin including myalgia and arthralgia and nausea. Also in today for liver function testing. Currently no chest pain, shortness of breath or other cardiovascular related symptoms noted.  Follow-up of diabetes.not checking regularly.  Generally runs between 95 and 125.  The patient denies symptoms such as polyuria, polydipsia, excessive hunger, nausea No significant hypoglycemic spells noted. Medications reviewed. Pt reports taking them regularly. Pt. denies complication/adverse reaction today.    History Adam Sutton has a past medical history of Cancer ([place]), Diabetes mellitus without complication ([place]), Hypertension, Lipidemia, and Sleep apnea (10/14/11).   He has a past surgical history that includes Umbilical hernia repair; bilateral jaw surgery; Robot assisted laparoscopic radical prostatectomy (10/22/2011); Prostate surgery; and Vasectomy.   His family history includes Cancer in his father, maternal grandmother, and mother; Diabetes in his father; Heart disease in his father; Hyperlipidemia in his father; Hypertension in his father.He reports that  has never smoked. he has never used smokeless tobacco. He reports that he drinks alcohol. He reports that he does not use drugs.  Current Outpatient Medications on File Prior to Visit  Medication Sig Dispense Refill  . ONETOUCH VERIO test strip      No current  facility-administered medications on file prior to visit.     ROS Review of Systems  Constitutional: Negative for chills, diaphoresis, fever and unexpected weight change.  HENT: Positive for congestion and rhinorrhea. Negative for hearing loss and sore throat.   Eyes: Negative for visual disturbance.  Respiratory: Positive for cough. Negative for shortness of breath.   Cardiovascular: Negative for chest pain.  Gastrointestinal: Negative for abdominal pain, constipation and diarrhea.  Genitourinary: Negative for dysuria and flank pain.  Musculoskeletal: Negative for arthralgias and joint swelling.  Skin: Negative for rash.  Neurological: Positive for dizziness (Vertigo recently 1 week ago had onset of cough and congestion). Negative for headaches.  Psychiatric/Behavioral: Negative for dysphoric mood and sleep disturbance.    Objective:  BP 138/87   Pulse 78   Temp (!) 97.3 F (36.3 C) (Oral)   Ht 5' 8"  (1.727 m)   Wt 250 lb (113.4 kg)   BMI 38.01 kg/m   BP Readings from Last 3 Encounters:  08/17/17 138/87  10/26/11 128/84  10/14/11 129/84    Wt Readings from Last 3 Encounters:  08/17/17 250 lb (113.4 kg)  10/26/11 230 lb 9.6 oz (104.6 kg)  10/14/11 248 lb (112.5 kg)     Physical Exam  Constitutional: He is oriented to person, place, and time. He appears well-developed and well-nourished. No distress.  HENT:  Head: Normocephalic and atraumatic.  Right Ear: External ear normal.  Left Ear: External ear normal.  Nose: Nose normal.  Mouth/Throat: Oropharynx is clear and moist.  Eyes: Conjunctivae and EOM are normal. Pupils are equal, round, and reactive to light.  Neck: Normal range of motion. Neck supple. No thyromegaly present.  Cardiovascular: Normal rate, regular rhythm and normal heart  sounds.  No murmur heard. Pulmonary/Chest: Effort normal and breath sounds normal. No respiratory distress. He has no wheezes. He has no rales.  Abdominal: Soft. Bowel sounds are  normal. He exhibits no distension. There is no tenderness.  Lymphadenopathy:    He has no cervical adenopathy.  Neurological: He is alert and oriented to person, place, and time. He has normal reflexes.  Skin: Skin is warm and dry.  Psychiatric: He has a normal mood and affect. His behavior is normal. Judgment and thought content normal.    Diabetic Foot Exam - Simple   Simple Foot Form Diabetic Foot exam was performed with the following findings:  Yes 08/17/2017  1:30 PM  Visual Inspection No deformities, no ulcerations, no other skin breakdown bilaterally:  Yes Sensation Testing Intact to touch and monofilament testing bilaterally:  Yes Pulse Check Posterior Tibialis and Dorsalis pulse intact bilaterally:  Yes Comments       Assessment & Plan:   Adam Sutton was seen today for new patient (initial visit).  Diagnoses and all orders for this visit:  Diabetes mellitus without complication ([place]) -     Bayer DCA Hb A1c Waived -     Urinalysis  Essential hypertension -     CBC with Differential/Platelet -     CMP14+EGFR  Mixed hyperlipidemia -     Lipid panel  Other urinary incontinence -     Ambulatory referral to Urology  History of robot-assisted laparoscopic radical prostatectomy -     Ambulatory referral to Urology  Other orders -     levofloxacin (LEVAQUIN) 500 MG tablet; Take 1 tablet (500 mg total) by mouth daily. -     benzonatate (TESSALON) 200 MG capsule; Take 1 capsule (200 mg total) by mouth 3 (three) times daily as needed for cough. -     glimepiride (AMARYL) 2 MG tablet; Take 1 tablet (2 mg total) by mouth daily with breakfast. -     lisinopril-hydrochlorothiazide (PRINZIDE,ZESTORETIC) 20-25 MG tablet; Take 1 tablet by mouth daily before breakfast. -     metFORMIN (GLUCOPHAGE) 500 MG tablet; Take 1 tablet (500 mg total) by mouth 2 (two) times daily with a meal. -     metoprolol tartrate (LOPRESSOR) 50 MG tablet; Take 1 tablet (50 mg total) by mouth 2 (two)  times daily. -     pravastatin (PRAVACHOL) 40 MG tablet; Take 1 tablet (40 mg total) by mouth at bedtime.   I have discontinued Adam Sutton's hyoscyamine and oxybutynin. I have also changed his glimepiride, lisinopril-hydrochlorothiazide, metFORMIN, metoprolol tartrate, and pravastatin. Additionally, I am having him start on levofloxacin and benzonatate. Lastly, I am having him maintain his ONETOUCH VERIO.  Meds ordered this encounter  Medications  . levofloxacin (LEVAQUIN) 500 MG tablet    Sig: Take 1 tablet (500 mg total) by mouth daily.    Dispense:  7 tablet    Refill:  0  . benzonatate (TESSALON) 200 MG capsule    Sig: Take 1 capsule (200 mg total) by mouth 3 (three) times daily as needed for cough.    Dispense:  20 capsule    Refill:  0  . glimepiride (AMARYL) 2 MG tablet    Sig: Take 1 tablet (2 mg total) by mouth daily with breakfast.    Dispense:  30 tablet    Refill:  5  . lisinopril-hydrochlorothiazide (PRINZIDE,ZESTORETIC) 20-25 MG tablet    Sig: Take 1 tablet by mouth daily before breakfast.    Dispense:  30 tablet  Refill:  5  . metFORMIN (GLUCOPHAGE) 500 MG tablet    Sig: Take 1 tablet (500 mg total) by mouth 2 (two) times daily with a meal.    Dispense:  60 tablet    Refill:  5  . metoprolol tartrate (LOPRESSOR) 50 MG tablet    Sig: Take 1 tablet (50 mg total) by mouth 2 (two) times daily.    Dispense:  60 tablet    Refill:  5  . pravastatin (PRAVACHOL) 40 MG tablet    Sig: Take 1 tablet (40 mg total) by mouth at bedtime.    Dispense:  30 tablet    Refill:  5     Follow-up: Return in about 3 months (around 11/17/2017).  Claretta Fraise, M.D.

## 2017-08-18 LAB — CBC WITH DIFFERENTIAL/PLATELET
BASOS ABS: 0 10*3/uL (ref 0.0–0.2)
Basos: 0 %
EOS (ABSOLUTE): 0.1 10*3/uL (ref 0.0–0.4)
EOS: 1 %
Hematocrit: 44.4 % (ref 37.5–51.0)
Hemoglobin: 14.7 g/dL (ref 13.0–17.7)
IMMATURE GRANULOCYTES: 0 %
Immature Grans (Abs): 0 10*3/uL (ref 0.0–0.1)
Lymphocytes Absolute: 2.7 10*3/uL (ref 0.7–3.1)
Lymphs: 27 %
MCH: 27 pg (ref 26.6–33.0)
MCHC: 33.1 g/dL (ref 31.5–35.7)
MCV: 82 fL (ref 79–97)
MONOS ABS: 0.7 10*3/uL (ref 0.1–0.9)
Monocytes: 7 %
NEUTROS PCT: 65 %
Neutrophils Absolute: 6.4 10*3/uL (ref 1.4–7.0)
PLATELETS: 309 10*3/uL (ref 150–379)
RBC: 5.45 x10E6/uL (ref 4.14–5.80)
RDW: 15.7 % — AB (ref 12.3–15.4)
WBC: 10 10*3/uL (ref 3.4–10.8)

## 2017-08-18 LAB — CMP14+EGFR
ALK PHOS: 106 IU/L (ref 39–117)
ALT: 14 IU/L (ref 0–44)
AST: 16 IU/L (ref 0–40)
Albumin/Globulin Ratio: 1.6 (ref 1.2–2.2)
Albumin: 4.4 g/dL (ref 3.6–4.8)
BUN/Creatinine Ratio: 17 (ref 10–24)
BUN: 17 mg/dL (ref 8–27)
Bilirubin Total: 0.7 mg/dL (ref 0.0–1.2)
CALCIUM: 9.4 mg/dL (ref 8.6–10.2)
CO2: 26 mmol/L (ref 20–29)
CREATININE: 1.01 mg/dL (ref 0.76–1.27)
Chloride: 96 mmol/L (ref 96–106)
GFR calc Af Amer: 91 mL/min/{1.73_m2} (ref >59)
GFR, EST NON AFRICAN AMERICAN: 79 mL/min/{1.73_m2} (ref >59)
GLUCOSE: 74 mg/dL (ref 65–99)
Globulin, Total: 2.7 g/dL (ref 1.5–4.5)
POTASSIUM: 4.2 mmol/L (ref 3.5–5.2)
Sodium: 141 mmol/L (ref 134–144)
Total Protein: 7.1 g/dL (ref 6.0–8.5)

## 2017-08-18 LAB — LIPID PANEL
CHOL/HDL RATIO: 4.3 ratio (ref 0.0–5.0)
CHOLESTEROL TOTAL: 173 mg/dL (ref 100–199)
HDL: 40 mg/dL (ref >39)
LDL CALC: 84 mg/dL (ref 0–99)
TRIGLYCERIDES: 243 mg/dL — AB (ref 0–149)
VLDL Cholesterol Cal: 49 mg/dL — ABNORMAL HIGH (ref 5–40)

## 2017-10-22 ENCOUNTER — Emergency Department (HOSPITAL_COMMUNITY): Payer: 59

## 2017-10-22 ENCOUNTER — Other Ambulatory Visit: Payer: Self-pay

## 2017-10-22 ENCOUNTER — Encounter (HOSPITAL_COMMUNITY): Payer: Self-pay | Admitting: *Deleted

## 2017-10-22 ENCOUNTER — Emergency Department (HOSPITAL_COMMUNITY)
Admission: EM | Admit: 2017-10-22 | Discharge: 2017-10-22 | Disposition: A | Discharge status: Home / Self Care | Payer: 59 | Attending: Emergency Medicine | Admitting: Emergency Medicine

## 2017-10-22 DIAGNOSIS — Z79899 Other long term (current) drug therapy: Secondary | ICD-10-CM | POA: Insufficient documentation

## 2017-10-22 DIAGNOSIS — W298XXA Contact with other powered powered hand tools and household machinery, initial encounter: Secondary | ICD-10-CM | POA: Insufficient documentation

## 2017-10-22 DIAGNOSIS — S61216A Laceration without foreign body of right little finger without damage to nail, initial encounter: Secondary | ICD-10-CM | POA: Insufficient documentation

## 2017-10-22 DIAGNOSIS — Z7984 Long term (current) use of oral hypoglycemic drugs: Secondary | ICD-10-CM | POA: Insufficient documentation

## 2017-10-22 DIAGNOSIS — Z23 Encounter for immunization: Secondary | ICD-10-CM | POA: Insufficient documentation

## 2017-10-22 DIAGNOSIS — Z8546 Personal history of malignant neoplasm of prostate: Secondary | ICD-10-CM | POA: Insufficient documentation

## 2017-10-22 DIAGNOSIS — Y939 Activity, unspecified: Secondary | ICD-10-CM | POA: Insufficient documentation

## 2017-10-22 DIAGNOSIS — E119 Type 2 diabetes mellitus without complications: Secondary | ICD-10-CM | POA: Insufficient documentation

## 2017-10-22 DIAGNOSIS — Y929 Unspecified place or not applicable: Secondary | ICD-10-CM | POA: Insufficient documentation

## 2017-10-22 DIAGNOSIS — I10 Essential (primary) hypertension: Secondary | ICD-10-CM | POA: Insufficient documentation

## 2017-10-22 DIAGNOSIS — Y999 Unspecified external cause status: Secondary | ICD-10-CM | POA: Insufficient documentation

## 2017-10-22 MED ORDER — LIDOCAINE HCL (PF) 2 % IJ SOLN
INTRAMUSCULAR | Status: AC
  Filled 2017-10-22: qty 20

## 2017-10-22 MED ORDER — TETANUS-DIPHTH-ACELL PERTUSSIS 5-2.5-18.5 LF-MCG/0.5 IM SUSP
0.5000 mL | Freq: Once | INTRAMUSCULAR | Status: AC
  Administered 2017-10-22: 0.5 mL via INTRAMUSCULAR
  Filled 2017-10-22: qty 0.5

## 2017-10-22 MED ORDER — SULFAMETHOXAZOLE-TRIMETHOPRIM 800-160 MG PO TABS: 1.0000 | ORAL_TABLET | Freq: Two times a day (BID) | ORAL | 0 refills | Status: AC

## 2017-10-22 MED ORDER — LIDOCAINE HCL (PF) 2 % IJ SOLN
10.0000 mL | Freq: Once | INTRAMUSCULAR | Status: AC
  Administered 2017-10-22: 10 mL

## 2017-10-22 MED ORDER — SULFAMETHOXAZOLE-TRIMETHOPRIM 800-160 MG PO TABS
1.0000 | ORAL_TABLET | Freq: Once | ORAL | Status: AC
  Administered 2017-10-22: 1 via ORAL
  Filled 2017-10-22: qty 1

## 2017-10-22 NOTE — ED Provider Notes (Signed)
[facility] EMERGENCY DEPARTMENT Provider Note   CSN: [id number] Arrival date & time: 10/22/17  1927     History   Chief Complaint Chief Complaint  Patient presents with  . Laceration    HPI Pj Zehner is a 62 y.o. male.  The history is provided by the patient. No language interpreter was used.  Laceration   The incident occurred 1 to 2 hours ago. The laceration is located on the right hand. The laceration is 1 cm in size. Injury mechanism: a pressure walker. The pain is moderate. He reports no foreign bodies present. His tetanus status is out of date.  Pt reports water shot across finger.  No chemicals, just water.   Past Medical History:  Diagnosis Date  . Cancer [facility])    prostate cancer  . Diabetes mellitus without complication ([place])   . Hypertension   . Lipidemia   . Sleep apnea 10/14/11   STOP BANG SCORE 4    Patient Active Problem List   Diagnosis Date Noted  . Hypertension 08/17/2017  . Diabetes mellitus without complication ([place]) 71/24/5809  . Lipidemia 08/17/2017    Past Surgical History:  Procedure Laterality Date  . bilateral jaw surgery     40+ yrs ago- for TMJ PROBLEMS AND WISDOM TEETH IMPACTIONS  . PROSTATE SURGERY    . ROBOT ASSISTED LAPAROSCOPIC RADICAL PROSTATECTOMY  10/22/2011   Procedure: ROBOTIC ASSISTED LAPAROSCOPIC RADICAL PROSTATECTOMY;  Surgeon: Molli Hazard, MD;  Location: [facility] ORS;  Service: Urology;  Laterality: N/A;     . UMBILICAL HERNIA REPAIR    . VASECTOMY          Home Medications    Prior to Admission medications   Medication Sig Start Date End Date Taking? Authorizing Provider  benzonatate (TESSALON) 200 MG capsule Take 1 capsule (200 mg total) by mouth 3 (three) times daily as needed for cough. 08/17/17   Claretta Fraise, MD  glimepiride (AMARYL) 2 MG tablet Take 1 tablet (2 mg total) by mouth daily with breakfast. 08/17/17   Claretta Fraise, MD  levofloxacin (LEVAQUIN) 500 MG tablet Take 1 tablet (500 mg total) by  mouth daily. 08/17/17   Claretta Fraise, MD  lisinopril-hydrochlorothiazide (PRINZIDE,ZESTORETIC) 20-25 MG tablet Take 1 tablet by mouth daily before breakfast. 08/17/17   Claretta Fraise, MD  metFORMIN (GLUCOPHAGE) 500 MG tablet Take 1 tablet (500 mg total) by mouth 2 (two) times daily with a meal. 08/17/17   Claretta Fraise, MD  metoprolol tartrate (LOPRESSOR) 50 MG tablet Take 1 tablet (50 mg total) by mouth 2 (two) times daily. 08/17/17   Claretta Fraise, MD  ONETOUCH VERIO test strip  07/17/17   [provider]  pravastatin (PRAVACHOL) 40 MG tablet Take 1 tablet (40 mg total) by mouth at bedtime. 08/17/17   Claretta Fraise, MD    Family History Family History  Problem Relation Age of Onset  . Cancer Mother        lung  . Cancer Father        prostate  . Diabetes Father   . Heart disease Father   . Hyperlipidemia Father   . Hypertension Father   . Cancer Maternal Grandmother        breast    Social History Social History   Tobacco Use  . Smoking status: Never Smoker  . Smokeless tobacco: Never Used  Substance Use Topics  . Alcohol use: Yes    Comment: OCCAS BEER  . Drug use: No     Allergies  Patient has no known allergies.   Review of Systems Review of Systems  Skin: Positive for wound. Negative for color change.  All other systems reviewed and are negative.    Physical Exam Updated Vital Signs BP (!) 157/106 (BP Location: Left Arm)   Pulse 75   Temp 99.6 F (37.6 C)   Resp 18   SpO2 97%   Physical Exam  Constitutional: He is oriented to person, place, and time. He appears well-developed and well-nourished.  Cardiovascular: Normal rate.  Pulmonary/Chest: Effort normal.  Neurological: He is alert and oriented to person, place, and time.  Skin: Skin is warm.  1 cm laceration over distal right 5th finger,  From nv and ns intact  Psychiatric: He has a normal mood and affect.  Nursing note and vitals reviewed.    ED Treatments / Results  Labs (all labs  ordered are listed, but only abnormal results are displayed) Labs Reviewed - No data to display  EKG None  Radiology Dg Finger Little Right  Result Date: 10/22/2017 CLINICAL DATA:  Finger lacerated by power washer at the distal end of his right little finger. EXAM: RIGHT LITTLE FINGER 2+V COMPARISON:  None. FINDINGS: No osseous fracture or dislocation seen. Soft tissue laceration overlying the distal phalanx, with associated soft tissue edema. No radiodense foreign body appreciated within the soft tissues. IMPRESSION: No osseous fracture or dislocation. Electronically Signed   By: Franki Cabot M.D.   On: 10/22/2017 20:18    Procedures .Marland KitchenLaceration Repair Date/Time: 10/22/2017 10:13 PM Performed by: Fransico Meadow, PA-C Authorized by: Fransico Meadow, PA-C   Consent:    Consent obtained:  Verbal   Consent given by:  Patient   Risks discussed:  Infection Laceration details:    Length (cm):  1   Depth (mm):  4 Repair type:    Repair type:  Simple Pre-procedure details:    Preparation:  Patient was prepped and draped in usual sterile fashion Treatment:    Area cleansed with:  Betadine   Amount of cleaning:  Standard   Irrigation solution:  Sterile saline Skin repair:    Repair method:  Sutures   Suture size:  5-0   Suture material:  Prolene   Suture technique:  Simple interrupted   Number of sutures:  3 Approximation:    Approximation:  Close Post-procedure details:    Dressing:  Open (no dressing)   Patient tolerance of procedure:  Tolerated well, no immediate complications   (including critical care time)  Medications Ordered in ED Medications - No data to display   Initial Impression / Assessment and Plan / ED Course  I have reviewed the triage vital signs and the nursing notes.  Pertinent labs & imaging results that were available during my care of the patient were reviewed by me and considered in my medical decision making (see chart for details).   I discussed  pt with Dr. Amedeo Plenty,  He advised have pt recheck in office next week ,  Avoid lake water,  Bactrim.    Pt counseled and agrees to plan.        Final Clinical Impressions(s) / ED Diagnoses   Final diagnoses:  Laceration of right little finger without foreign body, nail damage status unspecified, initial encounter    ED Discharge Orders    None     Meds ordered this encounter  Medications  . Tdap (BOOSTRIX) injection 0.5 mL  . lidocaine (XYLOCAINE) 2 % injection 10 mL  . lidocaine (  XYLOCAINE) 2 % injection    Wall, Yvette   : cabinet override  . sulfamethoxazole-trimethoprim (BACTRIM DS,SEPTRA DS) 800-160 MG tablet    Sig: Take 1 tablet by mouth 2 (two) times daily for 7 days.    Dispense:  14 tablet    Refill:  0    Order Specific Question:   Supervising Provider    Answer:   MILLER, BRIAN [[id number]]  . sulfamethoxazole-trimethoprim (BACTRIM DS,SEPTRA DS) 800-160 MG per tablet 1 tablet  An After Visit Summary was printed and given to the patient.     Sidney Ace 10/22/17 2215    Davonna Belling, MD 10/23/17 [phone]

## 2017-10-22 NOTE — ED Triage Notes (Signed)
Pt has laceration to his pinky finger on his right hand due to the water from his pressure washer hitting it at 3200 psi.

## 2017-10-27 ENCOUNTER — Encounter: Payer: Self-pay | Admitting: *Deleted

## 2017-10-27 ENCOUNTER — Ambulatory Visit: Payer: 59 | Admitting: Family Medicine

## 2017-10-27 ENCOUNTER — Encounter: Payer: Self-pay | Admitting: Family Medicine

## 2017-10-27 VITALS — BP 147/92 | HR 60 | Temp 97.3°F | Ht 68.0 in | Wt 249.1 lb

## 2017-10-27 DIAGNOSIS — S060X0A Concussion without loss of consciousness, initial encounter: Secondary | ICD-10-CM | POA: Insufficient documentation

## 2017-10-27 DIAGNOSIS — Z6837 Body mass index (BMI) 37.0-37.9, adult: Secondary | ICD-10-CM | POA: Insufficient documentation

## 2017-10-27 DIAGNOSIS — E7800 Pure hypercholesterolemia, unspecified: Secondary | ICD-10-CM | POA: Insufficient documentation

## 2017-10-27 DIAGNOSIS — Z8546 Personal history of malignant neoplasm of prostate: Secondary | ICD-10-CM | POA: Insufficient documentation

## 2017-10-27 DIAGNOSIS — K649 Unspecified hemorrhoids: Secondary | ICD-10-CM | POA: Insufficient documentation

## 2017-10-27 DIAGNOSIS — N3945 Continuous leakage: Secondary | ICD-10-CM | POA: Insufficient documentation

## 2017-10-27 DIAGNOSIS — L858 Other specified epidermal thickening: Secondary | ICD-10-CM | POA: Insufficient documentation

## 2017-10-27 DIAGNOSIS — L03012 Cellulitis of left finger: Secondary | ICD-10-CM | POA: Insufficient documentation

## 2017-10-27 DIAGNOSIS — S61216A Laceration without foreign body of right little finger without damage to nail, initial encounter: Secondary | ICD-10-CM | POA: Diagnosis not present

## 2017-10-27 DIAGNOSIS — R32 Unspecified urinary incontinence: Secondary | ICD-10-CM | POA: Insufficient documentation

## 2017-10-27 HISTORY — DX: Unspecified hemorrhoids: K64.9

## 2017-10-27 NOTE — Progress Notes (Signed)
Chief Complaint  Patient presents with  . Hospitalization Follow-up    pt here today to have little finger of right hand looked at because he was told to see hand surgeon but it unsure he needs to see them    HPI  Patient presents today for recheck of a laceration to the right fifth finger.  He was washing his RV with a pressure washer.  He was standing on a ladder when he lost his balance due to the recoil from the pressure.  In the moment he is somehow managed to turn the pressure washer onto the finger lacerating it.  He presented to the emergency department at [facility] on Thursday, May 9 and had laceration repair performed in the department after phone consultation with the hand surgeon.  The concern about infection was such that they recommended follow-up with the hand surgeon but the patient chose to come here instead.  PMH: Smoking status noted ROS: Per HPI  Objective: BP (!) 147/92   Pulse 60   Temp (!) 97.3 F (36.3 C) (Oral)   Ht 5\' 8"  (1.727 m)   Wt 249 lb 2 oz (113 kg)   BMI 37.88 kg/m  Gen: NAD, alert, cooperative with exam HEENT: NCAT, EOMI, PERRL CV: RRR, good S1/S2, no murmur Resp: CTABL, no wheezes, non-labored Ext: No edema, warm.  The wound is healing nicely 3 stitches are in place.  No signs of infection included redness and swelling.  It is minimally tender without purulence or drainage.  The extremity is neurovascularly intact.   Assessment and plan:  1. Laceration of right little finger without damage to nail, foreign body presence unspecified, initial encounter     No orders of the defined types were placed in this encounter.   No orders of the defined types were placed in this encounter.   Follow up as needed.  Claretta Fraise, MD

## 2017-11-02 ENCOUNTER — Ambulatory Visit: Payer: 59 | Admitting: Family Medicine

## 2017-11-02 VITALS — BP 146/89 | HR 60 | Temp 97.4°F | Ht 68.0 in | Wt 250.0 lb

## 2017-11-02 DIAGNOSIS — Z4802 Encounter for removal of sutures: Secondary | ICD-10-CM

## 2017-11-02 DIAGNOSIS — S61216D Laceration without foreign body of right little finger without damage to nail, subsequent encounter: Secondary | ICD-10-CM | POA: Diagnosis not present

## 2017-11-02 NOTE — Progress Notes (Signed)
Subjective: CC: Suture removal PCP: Claretta Fraise, MD NWG:[id number] Thum is a 62 y.o. male presenting to clinic today for:  1. Suture removal Patient was treated for a finger laceration of his pinky finger on 10/22/2017.  Presents today for suture removal.  He is up-to-date on his tetanus shot.  Patient reports that things have been going well.  Denies erythema, purulence or fevers.  No substantial tenderness.   ROS: Per HPI  Allergies  Allergen Reactions  . Tape Other (See Comments)    blisters  . Sulfamethoxazole-Trimethoprim Rash   Past Medical History:  Diagnosis Date  . Cancer [facility])    prostate cancer  . Diabetes mellitus without complication ([place])   . Hypertension   . Lipidemia   . Sleep apnea 10/14/11   STOP BANG SCORE 4    Current Outpatient Medications:  .  glimepiride (AMARYL) 2 MG tablet, Take 1 tablet (2 mg total) by mouth daily with breakfast., Disp: 30 tablet, Rfl: 5 .  lisinopril-hydrochlorothiazide (PRINZIDE,ZESTORETIC) 20-25 MG tablet, Take 1 tablet by mouth daily before breakfast., Disp: 30 tablet, Rfl: 5 .  metFORMIN (GLUCOPHAGE) 500 MG tablet, Take 1 tablet (500 mg total) by mouth 2 (two) times daily with a meal., Disp: 60 tablet, Rfl: 5 .  metoprolol tartrate (LOPRESSOR) 50 MG tablet, Take 1 tablet (50 mg total) by mouth 2 (two) times daily., Disp: 60 tablet, Rfl: 5 .  ONETOUCH VERIO test strip, , Disp: , Rfl:  .  pravastatin (PRAVACHOL) 40 MG tablet, Take 1 tablet (40 mg total) by mouth at bedtime., Disp: 30 tablet, Rfl: 5 Social History   Socioeconomic History  . Marital status: Married    Spouse name: Not on file  . Number of children: Not on file  . Years of education: Not on file  . Highest education level: Not on file  Occupational History  . Not on file  Social Needs  . Financial resource strain: Not on file  . Food insecurity:    Worry: Not on file    Inability: Not on file  . Transportation needs:    Medical: Not on file   Non-medical: Not on file  Tobacco Use  . Smoking status: Never Smoker  . Smokeless tobacco: Never Used  Substance and Sexual Activity  . Alcohol use: Yes    Comment: OCCAS BEER  . Drug use: No  . Sexual activity: Yes    Birth control/protection: Surgical  Lifestyle  . Physical activity:    Days per week: Not on file    Minutes per session: Not on file  . Stress: Not on file  Relationships  . Social connections:    Talks on phone: Not on file    Gets together: Not on file    Attends religious service: Not on file    Active member of club or organization: Not on file    Attends meetings of clubs or organizations: Not on file    Relationship status: Not on file  . Intimate partner violence:    Fear of current or ex partner: Not on file    Emotionally abused: Not on file    Physically abused: Not on file    Forced sexual activity: Not on file  Other Topics Concern  . Not on file  Social History Narrative  . Not on file   Family History  Problem Relation Age of Onset  . Cancer Mother        lung  . Cancer Father  prostate  . Diabetes Father   . Heart disease Father   . Hyperlipidemia Father   . Hypertension Father   . Cancer Maternal Grandmother        breast    Objective: Office vital signs reviewed. BP (!) 146/89   Pulse 60   Temp (!) 97.4 F (36.3 C) (Oral)   Ht 5\' 8"  (1.727 m)   Wt 250 lb (113.4 kg)   BMI 38.01 kg/m   Physical Examination:  General: Awake, alert, well nourished, well appearing, No acute distress Skin: Right pinky finger with a well-healed laceration.  There are 3 Prolene sutures in place.  The lateral aspect of the laceration with small amount of dehiscence secondary to suture failure.  No palpable induration, fluctuance or appreciable exudate.  No increased warmth or erythema.  Assessment/ Plan: 62 y.o. male   1. Visit for suture removal Verbal consent obtained and 3 Prolene sutures were removed.  Patient tolerated procedure  well.  No bleeding.  No immediate complications.  Home care instructions were reviewed with the patient and handout was provided.   2. Laceration of right little finger without foreign body without damage to nail, subsequent encounter   Janora Norlander, DO [place] [phone]

## 2017-11-02 NOTE — Patient Instructions (Signed)
Suture Removal, Care After Refer to this sheet in the next few weeks. These instructions provide you with information on caring for yourself after your procedure. Your health care provider may also give you more specific instructions. Your treatment has been planned according to current medical practices, but problems sometimes occur. Call your health care provider if you have any problems or questions after your procedure. What can I expect after the procedure? After your stitches (sutures) are removed, it is typical to have the following:  Some discomfort and swelling in the wound area.  Slight redness in the area.  Follow these instructions at home:  If you have skin adhesive strips over the wound area, do not take the strips off. They will fall off on their own in a few days. If the strips remain in place after 14 days, you may remove them.  Change any bandages (dressings) at least once a day or as directed by your health care provider. If the bandage sticks, soak it off with warm, soapy water.  Apply cream or ointment only as directed by your health care provider. If using cream or ointment, wash the area with soap and water 2 times a day to remove all the cream or ointment. Rinse off the soap and pat the area dry with a clean towel.  Keep the wound area dry and clean. If the bandage becomes wet or dirty, or if it develops a bad smell, change it as soon as possible.  Continue to protect the wound from injury.  Use sunscreen when out in the sun. New scars become sunburned easily. Contact a health care provider if:  You have increasing redness, swelling, or pain in the wound.  You see pus coming from the wound.  You have a fever.  You notice a bad smell coming from the wound or dressing.  Your wound breaks open (edges not staying together). This information is not intended to replace advice given to you by your health care provider. Make sure you discuss any questions you have  with your health care provider. Document Released: 02/25/2001 Document Revised: 11/08/2015 Document Reviewed: 01/12/2013 Elsevier Interactive Patient Education  2017 Elsevier Inc.  

## 2017-11-17 ENCOUNTER — Ambulatory Visit: Payer: 59 | Admitting: Family Medicine

## 2017-12-15 DIAGNOSIS — H52223 Regular astigmatism, bilateral: Secondary | ICD-10-CM | POA: Diagnosis not present

## 2017-12-15 DIAGNOSIS — H524 Presbyopia: Secondary | ICD-10-CM | POA: Diagnosis not present

## 2017-12-15 DIAGNOSIS — H5213 Myopia, bilateral: Secondary | ICD-10-CM | POA: Diagnosis not present

## 2017-12-24 ENCOUNTER — Telehealth: Payer: Self-pay | Admitting: Family Medicine

## 2017-12-24 NOTE — Telephone Encounter (Signed)
Pt given appt with Dr Livia Snellen 7/12 at 8:10.

## 2017-12-25 ENCOUNTER — Encounter: Payer: Self-pay | Admitting: Family Medicine

## 2017-12-25 ENCOUNTER — Ambulatory Visit: Payer: 59 | Admitting: Family Medicine

## 2017-12-25 VITALS — BP 147/97 | HR 56 | Temp 97.2°F | Ht 68.0 in | Wt 251.4 lb

## 2017-12-25 DIAGNOSIS — R42 Dizziness and giddiness: Secondary | ICD-10-CM

## 2017-12-25 DIAGNOSIS — E119 Type 2 diabetes mellitus without complications: Secondary | ICD-10-CM

## 2017-12-25 LAB — BAYER DCA HB A1C WAIVED: HB A1C (BAYER DCA - WAIVED): 6.3 % (ref <7.0)

## 2017-12-25 MED ORDER — ONETOUCH VERIO VI STRP: ORAL_STRIP | 12 refills | Status: DC

## 2017-12-25 NOTE — Progress Notes (Signed)
Subjective:  Patient ID: Adam Sutton, male    DOB: 25-Jan-1956  Age: 62 y.o. MRN: [id number]  CC: Dizziness   HPI Adam Sutton presents for episode of dizziness occurring 4 days ago.  He says it is the worst he is ever had the room was spinning dramatically and he could not get his bearings at all.  This lasted for about 10 minutes.  It was worse when he was laying down.  It has not recurred since that time.  It was not accompanied by fever chills or sweats.  He does feel some discomfort in his ears particularly on the right side.  He has had no numbness weakness or tingling on either side of the body.  His activities have been normal since that time.  Depression screen [facility] 2/9 12/25/2017 08/17/2017  Decreased Interest 0 0  Down, Depressed, Hopeless 0 0  PHQ - 2 Score 0 0    History Adam Sutton has a past medical history of Cancer ([place]), Diabetes mellitus without complication ([place]), Hypertension, Lipidemia, and Sleep apnea (10/14/11).   He has a past surgical history that includes Umbilical hernia repair; bilateral jaw surgery; Robot assisted laparoscopic radical prostatectomy (10/22/2011); Prostate surgery; and Vasectomy.   His family history includes Cancer in his father, maternal grandmother, and mother; Diabetes in his father; Heart disease in his father; Hyperlipidemia in his father; Hypertension in his father.He reports that he has never smoked. He has never used smokeless tobacco. He reports that he drinks alcohol. He reports that he does not use drugs.    ROS Review of Systems  Constitutional: Negative for fever.  HENT: Positive for congestion and ear pain. Negative for sinus pressure, sinus pain, sore throat and tinnitus.   Respiratory: Negative for shortness of breath.   Cardiovascular: Negative for chest pain.  Musculoskeletal: Negative for arthralgias.  Skin: Negative for rash.  Neurological: Positive for dizziness. Negative for tremors, seizures, syncope, weakness,  numbness and headaches.  Psychiatric/Behavioral: Negative for confusion. The patient is not nervous/anxious.     Objective:  BP (!) 147/97   Pulse (!) 56   Temp (!) 97.2 F (36.2 C) (Oral)   Ht 5\' 8"  (1.727 m)   Wt 251 lb 6.4 oz (114 kg)   BMI 38.23 kg/m   BP Readings from Last 3 Encounters:  12/25/17 (!) 147/97  11/02/17 (!) 146/89  10/27/17 (!) 147/92    Wt Readings from Last 3 Encounters:  12/25/17 251 lb 6.4 oz (114 kg)  11/02/17 250 lb (113.4 kg)  10/27/17 249 lb 2 oz (113 kg)     Physical Exam  Constitutional: He is oriented to person, place, and time. He appears well-developed and well-nourished.  HENT:  Head: Normocephalic and atraumatic.  Right Ear: Tympanic membrane normal. No mastoid tenderness. No decreased hearing is noted.  Left Ear: Tympanic membrane, external ear and ear canal normal. No mastoid tenderness.  No middle ear effusion. No decreased hearing is noted.  Mouth/Throat: No oropharyngeal exudate or posterior oropharyngeal erythema.  Right external ear canal occluded by cerumen.  After lavage his tympanogram is visualized and is normal.  Eyes: Pupils are equal, round, and reactive to light.  Neck: Normal range of motion. Neck supple.  Cardiovascular: Normal rate and regular rhythm.  No murmur heard. Pulmonary/Chest: Breath sounds normal. No respiratory distress.  Abdominal: Soft. He exhibits no mass. There is no tenderness.  Musculoskeletal: Normal range of motion. He exhibits no deformity.  Neurological: He is alert and oriented to person, place,  and time. He displays normal reflexes. No cranial nerve deficit. He exhibits normal muscle tone. Coordination normal.  Skin: Skin is warm and dry. No rash noted.  Psychiatric: He has a normal mood and affect.  Vitals reviewed.     Assessment & Plan:   Adam Sutton was seen today for dizziness.  Diagnoses and all orders for this visit:  Diabetes mellitus without complication ([place]) -     Bayer DCA Hb  A1c Waived  Vertigo  Other orders -     ONETOUCH VERIO test strip; Check glucose twice daily       I have changed Adam Sutton's ONETOUCH VERIO. I am also having him maintain his glimepiride, lisinopril-hydrochlorothiazide, metFORMIN, metoprolol tartrate, and pravastatin.  Allergies as of 12/25/2017      Reactions   Tape Other (See Comments)   blisters   Sulfamethoxazole-trimethoprim Rash      Medication List        Accurate as of 12/25/17 11:59 PM. Always use your most recent med list.          glimepiride 2 MG tablet Commonly known as:  AMARYL Take 1 tablet (2 mg total) by mouth daily with breakfast.   lisinopril-hydrochlorothiazide 20-25 MG tablet Commonly known as:  PRINZIDE,ZESTORETIC Take 1 tablet by mouth daily before breakfast.   metFORMIN 500 MG tablet Commonly known as:  GLUCOPHAGE Take 1 tablet (500 mg total) by mouth 2 (two) times daily with a meal.   metoprolol tartrate 50 MG tablet Commonly known as:  LOPRESSOR Take 1 tablet (50 mg total) by mouth 2 (two) times daily.   ONETOUCH VERIO test strip Generic drug:  glucose blood Check glucose twice daily   pravastatin 40 MG tablet Commonly known as:  PRAVACHOL Take 1 tablet (40 mg total) by mouth at bedtime.        Follow-up: Return if symptoms worsen or fail to improve.  Claretta Fraise, M.D.

## 2017-12-29 ENCOUNTER — Encounter: Payer: Self-pay | Admitting: Family Medicine

## 2018-02-05 ENCOUNTER — Telehealth: Payer: Self-pay | Admitting: *Deleted

## 2018-02-05 MED ORDER — METFORMIN HCL 500 MG PO TABS: 500.0000 mg | ORAL_TABLET | Freq: Two times a day (BID) | ORAL | 0 refills | Status: DC

## 2018-02-05 MED ORDER — GLIMEPIRIDE 2 MG PO TABS: 2.0000 mg | ORAL_TABLET | Freq: Every day | ORAL | 0 refills | Status: DC

## 2018-02-05 MED ORDER — PRAVASTATIN SODIUM 40 MG PO TABS: 40.0000 mg | ORAL_TABLET | Freq: Every day | ORAL | 0 refills | Status: DC

## 2018-02-05 MED ORDER — METOPROLOL TARTRATE 50 MG PO TABS: 50.0000 mg | ORAL_TABLET | Freq: Two times a day (BID) | ORAL | 0 refills | Status: DC

## 2018-02-05 MED ORDER — ONETOUCH VERIO VI STRP: ORAL_STRIP | 3 refills | Status: DC

## 2018-02-05 MED ORDER — LISINOPRIL-HYDROCHLOROTHIAZIDE 20-25 MG PO TABS: 1.0000 | ORAL_TABLET | Freq: Every day | ORAL | 0 refills | Status: DC

## 2018-02-05 NOTE — Telephone Encounter (Signed)
Pt aware 90 day Rxs sent to [facility]

## 2018-03-19 DIAGNOSIS — D229 Melanocytic nevi, unspecified: Secondary | ICD-10-CM | POA: Diagnosis not present

## 2018-03-19 DIAGNOSIS — L918 Other hypertrophic disorders of the skin: Secondary | ICD-10-CM | POA: Diagnosis not present

## 2018-03-19 DIAGNOSIS — L821 Other seborrheic keratosis: Secondary | ICD-10-CM | POA: Diagnosis not present

## 2018-03-29 ENCOUNTER — Encounter: Payer: Self-pay | Admitting: Family Medicine

## 2018-03-29 ENCOUNTER — Ambulatory Visit: Payer: 59 | Admitting: Family Medicine

## 2018-03-29 VITALS — BP 146/90 | HR 59 | Temp 97.9°F | Ht 68.0 in | Wt 255.0 lb

## 2018-03-29 DIAGNOSIS — E119 Type 2 diabetes mellitus without complications: Secondary | ICD-10-CM

## 2018-03-29 DIAGNOSIS — E782 Mixed hyperlipidemia: Secondary | ICD-10-CM | POA: Diagnosis not present

## 2018-03-29 DIAGNOSIS — I10 Essential (primary) hypertension: Secondary | ICD-10-CM

## 2018-03-29 DIAGNOSIS — Z23 Encounter for immunization: Secondary | ICD-10-CM | POA: Diagnosis not present

## 2018-03-29 LAB — BAYER DCA HB A1C WAIVED: HB A1C: 7 % — AB (ref <7.0)

## 2018-03-29 MED ORDER — METOPROLOL TARTRATE 50 MG PO TABS: 50.0000 mg | ORAL_TABLET | Freq: Two times a day (BID) | ORAL | 1 refills | Status: DC

## 2018-03-29 MED ORDER — ONETOUCH VERIO VI STRP: ORAL_STRIP | 11 refills | Status: DC

## 2018-03-29 MED ORDER — GLIMEPIRIDE 2 MG PO TABS: 2.0000 mg | ORAL_TABLET | Freq: Every day | ORAL | 1 refills | Status: DC

## 2018-03-29 MED ORDER — METOPROLOL TARTRATE 100 MG PO TABS: 50.0000 mg | ORAL_TABLET | Freq: Every day | ORAL | 1 refills | Status: DC

## 2018-03-29 MED ORDER — LISINOPRIL-HYDROCHLOROTHIAZIDE 20-25 MG PO TABS: 1.0000 | ORAL_TABLET | Freq: Every day | ORAL | 1 refills | Status: DC

## 2018-03-29 MED ORDER — METOPROLOL TARTRATE 100 MG PO TABS: 100.0000 mg | ORAL_TABLET | Freq: Every day | ORAL | 1 refills | Status: DC

## 2018-03-29 MED ORDER — METFORMIN HCL 500 MG PO TABS: 500.0000 mg | ORAL_TABLET | Freq: Two times a day (BID) | ORAL | 1 refills | Status: DC

## 2018-03-29 MED ORDER — PRAVASTATIN SODIUM 40 MG PO TABS: 40.0000 mg | ORAL_TABLET | Freq: Every day | ORAL | 1 refills | Status: DC

## 2018-03-29 NOTE — Progress Notes (Signed)
Subjective:  Patient ID: Adam Sutton,  male    DOB: 1955-10-10  Age: 62 y.o.    CC: Diabetes (3 mo); Hyperlipidemia; and Hypertension   HPI Kenrick Pore presents for  follow-up of hypertension. Patient has no history of headache chest pain or shortness of breath or recent cough. Patient also denies symptoms of TIA such as numbness weakness lateralizing. Patient denies side effects from medication. States taking it regularly.  Patient also  in for follow-up of elevated cholesterol. Doing well without complaints on current medication. Denies side effects  including myalgia and arthralgia and nausea. Also in today for liver function testing. Currently no chest pain, shortness of breath or other cardiovascular related symptoms noted.  Follow-up of diabetes. Patient does not check blood sugar.  Tries to stay on a reasonable diet.  Weight is noted to be up 4 pounds. Patient denies symptoms such as excessive hunger or urinary frequency, excessive hunger, nausea No significant hypoglycemic spells noted. Medications reviewed. Pt reports taking them regularly. Pt. denies complication/adverse reaction today.    History Greyson has a past medical history of Cancer ([place]), Diabetes mellitus without complication ([place]), Hypertension, Lipidemia, and Sleep apnea (10/14/11).   He has a past surgical history that includes Umbilical hernia repair; bilateral jaw surgery; Robot assisted laparoscopic radical prostatectomy (10/22/2011); Prostate surgery; and Vasectomy.   His family history includes Cancer in his father, maternal grandmother, and mother; Diabetes in his father; Heart disease in his father; Hyperlipidemia in his father; Hypertension in his father.He reports that he has never smoked. He has never used smokeless tobacco. He reports that he drinks alcohol. He reports that he does not use drugs.  No current outpatient medications on file prior to visit.   No current facility-administered  medications on file prior to visit.     ROS Review of Systems  Constitutional: Negative.   HENT: Negative.   Eyes: Negative for visual disturbance.  Respiratory: Negative for cough and shortness of breath.   Cardiovascular: Negative for chest pain and leg swelling.  Gastrointestinal: Negative for abdominal pain, diarrhea, nausea and vomiting.  Genitourinary: Negative for difficulty urinating.  Musculoskeletal: Negative for arthralgias and myalgias.  Skin: Negative for rash.  Neurological: Negative for headaches.  Psychiatric/Behavioral: Negative for sleep disturbance.    Objective:  BP (!) 146/90   Pulse (!) 59   Temp 97.9 F (36.6 C) (Oral)   Ht _0  (1.727 m)   Wt 255 lb (115.7 kg)   BMI 38.77 kg/m   BP Readings from Last 3 Encounters:  03/29/18 (!) 146/90  12/25/17 (!) 147/97  11/02/17 (!) 146/89    Wt Readings from Last 3 Encounters:  03/29/18 255 lb (115.7 kg)  12/25/17 251 lb 6.4 oz (114 kg)  11/02/17 250 lb (113.4 kg)     Physical Exam  Constitutional: He is oriented to person, place, and time. He appears well-developed and well-nourished. No distress.  HENT:  Head: Normocephalic and atraumatic.  Right Ear: External ear normal.  Left Ear: External ear normal.  Nose: Nose normal.  Mouth/Throat: Oropharynx is clear and moist.  Eyes: Pupils are equal, round, and reactive to light. Conjunctivae and EOM are normal.  Neck: Normal range of motion. Neck supple.  Cardiovascular: Normal rate, regular rhythm and normal heart sounds.  No murmur heard. Pulmonary/Chest: Effort normal and breath sounds normal. No respiratory distress. He has no wheezes. He has no rales.  Abdominal: Soft. There is no tenderness.  Musculoskeletal: Normal range of motion.  Neurological:  He is alert and oriented to person, place, and time. He has normal reflexes.  Skin: Skin is warm and dry.  Psychiatric: He has a normal mood and affect. His behavior is normal. Judgment and thought  content normal.    Results for orders placed or performed in visit on 03/29/18  Bayer DCA Hb A1c Waived  Result Value Ref Range   HB A1C (BAYER DCA - WAIVED) 7.0 (H) <7.0 %       Assessment & Plan:   Donnavan was seen today for diabetes, hyperlipidemia and hypertension.  Diagnoses and all orders for this visit:  Diabetes mellitus without complication ([place]) -     Bayer [place] Hb A1c Waived -     Ambulatory referral to Ophthalmology  Essential hypertension -     CMP14+EGFR  Mixed hyperlipidemia -     Lipid panel  Need for immunization against influenza -     Flu Vaccine QUAD 36+ mos IM  Other orders -     pravastatin (PRAVACHOL) 40 MG tablet; Take 1 tablet (40 mg total) by mouth at bedtime. -     Discontinue: metoprolol tartrate (LOPRESSOR) 50 MG tablet; Take 1 tablet (50 mg total) by mouth 2 (two) times daily. -     metFORMIN (GLUCOPHAGE) 500 MG tablet; Take 1 tablet (500 mg total) by mouth 2 (two) times daily with a meal. -     lisinopril-hydrochlorothiazide (PRINZIDE,ZESTORETIC) 20-25 MG tablet; Take 1 tablet by mouth daily before breakfast. -     glimepiride (AMARYL) 2 MG tablet; Take 1 tablet (2 mg total) by mouth daily with breakfast. -     Discontinue: metoprolol tartrate (LOPRESSOR) 100 MG tablet; Take 0.5 tablets (50 mg total) by mouth daily. -     ONETOUCH VERIO test strip; Check glucose twice daily -     metoprolol tartrate (LOPRESSOR) 100 MG tablet; Take 1 tablet (100 mg total) by mouth daily.   I have discontinued Kin Barrows's metoprolol tartrate and metoprolol tartrate. I have also changed his metoprolol tartrate. Additionally, I am having him maintain his pravastatin, metFORMIN, lisinopril-hydrochlorothiazide, glimepiride, and ONETOUCH VERIO.  Meds ordered this encounter  Medications  . pravastatin (PRAVACHOL) 40 MG tablet    Sig: Take 1 tablet (40 mg total) by mouth at bedtime.    Dispense:  90 tablet    Refill:  1  . DISCONTD: metoprolol tartrate  (LOPRESSOR) 50 MG tablet    Sig: Take 1 tablet (50 mg total) by mouth 2 (two) times daily.    Dispense:  180 tablet    Refill:  1  . metFORMIN (GLUCOPHAGE) 500 MG tablet    Sig: Take 1 tablet (500 mg total) by mouth 2 (two) times daily with a meal.    Dispense:  180 tablet    Refill:  1  . lisinopril-hydrochlorothiazide (PRINZIDE,ZESTORETIC) 20-25 MG tablet    Sig: Take 1 tablet by mouth daily before breakfast.    Dispense:  90 tablet    Refill:  1  . glimepiride (AMARYL) 2 MG tablet    Sig: Take 1 tablet (2 mg total) by mouth daily with breakfast.    Dispense:  90 tablet    Refill:  1  . DISCONTD: metoprolol tartrate (LOPRESSOR) 100 MG tablet    Sig: Take 0.5 tablets (50 mg total) by mouth daily.    Dispense:  90 tablet    Refill:  1  . ONETOUCH VERIO test strip    Sig: Check glucose twice daily  Dispense:  200 each    Refill:  11    E11.9  . metoprolol tartrate (LOPRESSOR) 100 MG tablet    Sig: Take 1 tablet (100 mg total) by mouth daily.    Dispense:  90 tablet    Refill:  1   I encouraged patient to check his blood pressure and blood sugar at home.  He is going to avoid salt for now. Dash Meal plan recommended  Follow-up: Return in about 3 months (around 06/29/2018).  Claretta Fraise, M.D.

## 2018-03-30 LAB — CMP14+EGFR
A/G RATIO: 1.5 (ref 1.2–2.2)
ALBUMIN: 4 g/dL (ref 3.6–4.8)
ALK PHOS: 111 IU/L (ref 39–117)
ALT: 13 IU/L (ref 0–44)
AST: 15 IU/L (ref 0–40)
BILIRUBIN TOTAL: 0.6 mg/dL (ref 0.0–1.2)
BUN / CREAT RATIO: 19 (ref 10–24)
BUN: 15 mg/dL (ref 8–27)
CHLORIDE: 99 mmol/L (ref 96–106)
CO2: 26 mmol/L (ref 20–29)
Calcium: 8.8 mg/dL (ref 8.6–10.2)
Creatinine, Ser: 0.81 mg/dL (ref 0.76–1.27)
GFR calc non Af Amer: 94 mL/min/{1.73_m2} (ref >59)
GFR, EST AFRICAN AMERICAN: 109 mL/min/{1.73_m2} (ref >59)
GLOBULIN, TOTAL: 2.7 g/dL (ref 1.5–4.5)
Glucose: 133 mg/dL — ABNORMAL HIGH (ref 65–99)
Potassium: 4 mmol/L (ref 3.5–5.2)
SODIUM: 140 mmol/L (ref 134–144)
TOTAL PROTEIN: 6.7 g/dL (ref 6.0–8.5)

## 2018-03-30 LAB — LIPID PANEL
CHOLESTEROL TOTAL: 197 mg/dL (ref 100–199)
Chol/HDL Ratio: 5.5 ratio — ABNORMAL HIGH (ref 0.0–5.0)
HDL: 36 mg/dL — ABNORMAL LOW (ref >39)
LDL Calculated: 93 mg/dL (ref 0–99)
Triglycerides: 339 mg/dL — ABNORMAL HIGH (ref 0–149)
VLDL Cholesterol Cal: 68 mg/dL — ABNORMAL HIGH (ref 5–40)

## 2018-06-14 ENCOUNTER — Telehealth: Payer: Self-pay | Admitting: Family Medicine

## 2018-06-14 NOTE — Telephone Encounter (Signed)
Patient aware and verbalizes understanding. 

## 2018-06-14 NOTE — Telephone Encounter (Signed)
Pt. Needs to be seen for this. Thanks, WS 

## 2018-06-14 NOTE — Telephone Encounter (Signed)
What symptoms do you have? Pain shooting down his arm to his fingers and neck is hurting as well  How long have you been sick? For about a week   Have you been seen for this problem? no  If your provider decides to give you a prescription, which pharmacy would you like for it to be sent to? Wants an anti inflammatory was prescribed  This by Dr. Scotty Court in the past   Patient informed that this information will be sent to the clinical staff for review and that they should receive a follow up call.

## 2018-06-17 ENCOUNTER — Ambulatory Visit (INDEPENDENT_AMBULATORY_CARE_PROVIDER_SITE_OTHER): Payer: 59 | Admitting: Family Medicine

## 2018-06-17 ENCOUNTER — Encounter: Payer: Self-pay | Admitting: Family Medicine

## 2018-06-17 ENCOUNTER — Ambulatory Visit (INDEPENDENT_AMBULATORY_CARE_PROVIDER_SITE_OTHER): Payer: 59

## 2018-06-17 VITALS — BP 161/91 | HR 68 | Temp 97.6°F | Ht 68.0 in | Wt 251.4 lb

## 2018-06-17 DIAGNOSIS — M5412 Radiculopathy, cervical region: Secondary | ICD-10-CM

## 2018-06-17 DIAGNOSIS — M47812 Spondylosis without myelopathy or radiculopathy, cervical region: Secondary | ICD-10-CM | POA: Diagnosis not present

## 2018-06-17 MED ORDER — PREDNISONE 20 MG PO TABS: ORAL_TABLET | ORAL | 0 refills | Status: DC

## 2018-06-17 MED ORDER — GABAPENTIN 300 MG PO CAPS
300.0000 mg | ORAL_CAPSULE | Freq: Two times a day (BID) | ORAL | 0 refills | Status: DC
Start: 2018-06-17 — End: 2018-07-02

## 2018-06-17 NOTE — Progress Notes (Signed)
BP (!) 161/91   Pulse 68   Temp 97.6 F (36.4 C) (Oral)   Ht 5\' 8"  (1.727 m)   Wt 251 lb 6.4 oz (114 kg)   BMI 38.23 kg/m    Subjective:    Patient ID: Adam Sutton, male    DOB: [birth year], 63 y.o.   MRN: [id number]  HPI: Adam Sutton is a 63 y.o. male presenting on 06/17/2018 for Neck Pain (Right side and goes down arm x 1 week)   HPI Right sided nerve pain going all the way down his arm from neck Patient has right sided pain shooting all the way down from the top of his shoulder and neck down to his arm, mainly on the anterior lateral side but he says sometimes on both sides.  He says it will be worse with arm range of motion or neck range of motion but is better when he puts his arm on top of his head.  He has been using Tylenol and a little bit ibuprofen without much success.  He denies any numbness or weakness to this point but just more has that shooting pain  Relevant past medical, surgical, family and social history reviewed and updated as indicated. Interim medical history since our last visit reviewed. Allergies and medications reviewed and updated.  Review of Systems  Constitutional: Negative for chills and fever.  Eyes: Negative for visual disturbance.  Respiratory: Negative for shortness of breath and wheezing.   Cardiovascular: Negative for chest pain and leg swelling.  Musculoskeletal: Positive for neck pain. Negative for back pain and gait problem.  Skin: Negative for rash.  Neurological: Negative for weakness and numbness.  All other systems reviewed and are negative.   Per HPI unless specifically indicated above   Allergies as of 06/17/2018      Reactions   Tape Other (See Comments)   blisters   Sulfamethoxazole-trimethoprim Rash      Medication List       Accurate as of June 17, 2018  3:14 PM. Always use your most recent med list.        gabapentin 300 MG capsule Commonly known as:  NEURONTIN Take 1 capsule (300 mg total) by mouth 2  (two) times daily.   glimepiride 2 MG tablet Commonly known as:  AMARYL Take 1 tablet (2 mg total) by mouth daily with breakfast.   lisinopril-hydrochlorothiazide 20-25 MG tablet Commonly known as:  PRINZIDE,ZESTORETIC Take 1 tablet by mouth daily before breakfast.   metFORMIN 500 MG tablet Commonly known as:  GLUCOPHAGE Take 1 tablet (500 mg total) by mouth 2 (two) times daily with a meal.   metoprolol tartrate 100 MG tablet Commonly known as:  LOPRESSOR Take 1 tablet (100 mg total) by mouth daily.   ONETOUCH VERIO test strip Generic drug:  glucose blood Check glucose twice daily   pravastatin 40 MG tablet Commonly known as:  PRAVACHOL Take 1 tablet (40 mg total) by mouth at bedtime.   predniSONE 20 MG tablet Commonly known as:  DELTASONE 2 po at same time daily for 5 days          Objective:    BP (!) 161/91   Pulse 68   Temp 97.6 F (36.4 C) (Oral)   Ht 5\' 8"  (1.727 m)   Wt 251 lb 6.4 oz (114 kg)   BMI 38.23 kg/m   Wt Readings from Last 3 Encounters:  06/17/18 251 lb 6.4 oz (114 kg)  03/29/18 255 lb (115.7 kg)  12/25/17 251 lb 6.4 oz (114 kg)    Physical Exam Vitals signs and nursing note reviewed.  Constitutional:      General: He is not in acute distress.    Appearance: He is well-developed. He is not diaphoretic.  Eyes:     General: No scleral icterus.    Conjunctiva/sclera: Conjunctivae normal.  Neck:     Musculoskeletal: Neck supple.     Thyroid: No thyromegaly.  Cardiovascular:     Rate and Rhythm: Normal rate and regular rhythm.     Heart sounds: Normal heart sounds. No murmur.  Pulmonary:     Effort: Pulmonary effort is normal. No respiratory distress.     Breath sounds: Normal breath sounds. No wheezing.  Musculoskeletal: Normal range of motion.     Cervical back: He exhibits no tenderness (No tenderness on exam but has shooting pain increased with anterior flexion of the neck and movement of the arm, improved with overhead range of  motion of arm).  Lymphadenopathy:     Cervical: No cervical adenopathy.  Skin:    General: Skin is warm and dry.     Findings: No rash.  Neurological:     Mental Status: He is alert and oriented to person, place, and time.     Coordination: Coordination normal.  Psychiatric:        Behavior: Behavior normal.     Cervical spine x-ray: Await final read from radiology    Assessment & Plan:   Problem List Items Addressed This Visit    None    Visit Diagnoses    Cervical radiculopathy    -  Primary   Relevant Medications   gabapentin (NEURONTIN) 300 MG capsule   predniSONE (DELTASONE) 20 MG tablet   Other Relevant Orders   DG Cervical Spine Complete      Instructed patient to double metformin while on prednisone and give gabapentin for radicular nerve pain from neck. Follow up plan: Return in about 1 week (around 06/24/2018), or if symptoms worsen or fail to improve, for Blood pressure recheck and arm recheck.  Counseling provided for all of the vaccine components Orders Placed This Encounter  Procedures  . DG Cervical Spine Complete    Caryl Pina, MD [facility] 06/17/2018, 3:14 PM

## 2018-06-23 ENCOUNTER — Encounter: Payer: Self-pay | Admitting: Family Medicine

## 2018-06-23 ENCOUNTER — Ambulatory Visit: Payer: 59 | Admitting: Family Medicine

## 2018-06-23 VITALS — BP 162/96 | HR 64 | Temp 97.3°F | Ht 68.0 in | Wt 249.4 lb

## 2018-06-23 DIAGNOSIS — M4802 Spinal stenosis, cervical region: Secondary | ICD-10-CM

## 2018-06-23 DIAGNOSIS — M5030 Other cervical disc degeneration, unspecified cervical region: Secondary | ICD-10-CM

## 2018-06-23 DIAGNOSIS — M4722 Other spondylosis with radiculopathy, cervical region: Secondary | ICD-10-CM | POA: Diagnosis not present

## 2018-06-23 NOTE — Progress Notes (Signed)
Subjective  Adam Sutton is a 63 year old male coming in on 06/23/17 for neck pain and BP check.  HPI Patient came in on 06/17/17 complaining of neck pain that radiated down his right arm to his fingertips. He was given Neurontin and prednisone and scheduled for an X-ray of his cervical spine. Today he states the pain has reduced from a severity of 9 to 2 and he is able to return to normal function. He has regained full arm range of motion, but still has shooting pain down his shoulder while rotating his head to the right and extending neck.  He denies any numbness or weakness but just has the sharp shooting pain.  Patient says that he would be able to tolerate this for long-term but is just concerned about what type of long-term damage is being caused by still having the nerve impingement.  BP is still elevated and patient states he has been taking his antihypertensive medicine as directed.  Patient will follow-up with PCP on this issue  ROS Constitutional: No fever or chills HEENT: cough, congestion, and rhinorrhea for past week that patient has mostly alleviated with Benadryl Respiratory: no shortness or breath Cardiac: no chest pain MSK: neck pain that radiates through shoulder, anterior arm, and fingers, no numbness or weakness.  Objective  Vitals: BP-162/96; P-64; T-97.3; Wt 249.4 General: no acute distress MSK:  Cardiac: regular rate and rhythm, no m/r/g Pulmonary: clear to auscultation bilaterally MSK: normal arm range of motion, limited ROM in neck, pain with neck extension and rotation, no tenderness to palpation, 5/5 strength.  With neck range of motion pain does shoot all the way down his arm to his fingertips.  Patient denies any shooting pain anywhere else with range of motion.  Patient denies any pain with shoulder range of motion today which is an improvement from previous.  Imaging: Cervical Spine Xray Results: Moderate to advanced degenerative changes at C5-6 and C6-7  with disc space narrowing and spurring. Bilateral bony foraminal narrowing due to uncinate spurring bilaterally mainly at C4-5, C5-6 and C6-7. IMPRESSION: Degenerative cervical spondylosis with disc disease mainly at C4-5, C5-6 and C6-7. There are also uncinate spurring changes with bilateral foraminal narrowing at these levels.  Assessment  Nerve impingement due to cervical foraminal stenosis Problem List Items Addressed This Visit    None    Visit Diagnoses    Osteoarthritis of spine with radiculopathy, cervical region    -  Primary   Foraminal stenosis of cervical region       Other cervical disc degeneration, unspecified cervical region       Relevant Orders   MR Cervical Spine Wo Contrast      Plan  - Continue taking Neurontin to control nerve pain. - MRI of cervical spine - Discussed speaking to a spinal surgeon after MRI - Patient will follow up with Dr. Livia Snellen on 07/03/17 regarding BP  Patient was seen and examined with Roselyn Meier [place], [place] student, agree with assessment and plan above.  I do think that the patient needs an MRI and will attempt to order one but if we cannot get an improved right now he will continue to do exercises and stretching and come back in 5 weeks if it is still present. Caryl Pina, MD [facility] 06/23/2018, 1:31 PM

## 2018-07-02 ENCOUNTER — Encounter: Payer: Self-pay | Admitting: Family Medicine

## 2018-07-02 ENCOUNTER — Ambulatory Visit: Payer: 59 | Admitting: Family Medicine

## 2018-07-02 VITALS — BP 145/85 | HR 68 | Temp 97.7°F | Ht 68.0 in | Wt 249.1 lb

## 2018-07-02 DIAGNOSIS — E782 Mixed hyperlipidemia: Secondary | ICD-10-CM | POA: Diagnosis not present

## 2018-07-02 DIAGNOSIS — M5412 Radiculopathy, cervical region: Secondary | ICD-10-CM | POA: Diagnosis not present

## 2018-07-02 DIAGNOSIS — E119 Type 2 diabetes mellitus without complications: Secondary | ICD-10-CM

## 2018-07-02 DIAGNOSIS — N3945 Continuous leakage: Secondary | ICD-10-CM | POA: Diagnosis not present

## 2018-07-02 LAB — URINALYSIS
Bilirubin, UA: NEGATIVE
Glucose, UA: NEGATIVE
Ketones, UA: NEGATIVE
Leukocytes, UA: NEGATIVE
NITRITE UA: NEGATIVE
Protein, UA: NEGATIVE
Specific Gravity, UA: 1.025 (ref 1.005–1.030)
Urobilinogen, Ur: 1 mg/dL (ref 0.2–1.0)
pH, UA: 5.5 (ref 5.0–7.5)

## 2018-07-02 LAB — CMP14+EGFR
ALT: 16 IU/L (ref 0–44)
AST: 16 IU/L (ref 0–40)
Albumin/Globulin Ratio: 1.8 (ref 1.2–2.2)
Albumin: 4.2 g/dL (ref 3.6–4.8)
Alkaline Phosphatase: 89 IU/L (ref 39–117)
BUN/Creatinine Ratio: 22 (ref 10–24)
BUN: 22 mg/dL (ref 8–27)
Bilirubin Total: 0.6 mg/dL (ref 0.0–1.2)
CO2: 24 mmol/L (ref 20–29)
Calcium: 9.1 mg/dL (ref 8.6–10.2)
Chloride: 98 mmol/L (ref 96–106)
Creatinine, Ser: 0.98 mg/dL (ref 0.76–1.27)
GFR calc Af Amer: 94 mL/min/{1.73_m2} (ref >59)
GFR calc non Af Amer: 81 mL/min/{1.73_m2} (ref >59)
Globulin, Total: 2.4 g/dL (ref 1.5–4.5)
Glucose: 96 mg/dL (ref 65–99)
Potassium: 3.9 mmol/L (ref 3.5–5.2)
SODIUM: 143 mmol/L (ref 134–144)
Total Protein: 6.6 g/dL (ref 6.0–8.5)

## 2018-07-02 LAB — CBC WITH DIFFERENTIAL/PLATELET
BASOS ABS: 0.1 10*3/uL (ref 0.0–0.2)
Basos: 1 %
EOS (ABSOLUTE): 0.2 10*3/uL (ref 0.0–0.4)
Eos: 2 %
Hematocrit: 43 % (ref 37.5–51.0)
Hemoglobin: 14.2 g/dL (ref 13.0–17.7)
Immature Grans (Abs): 0 10*3/uL (ref 0.0–0.1)
Immature Granulocytes: 0 %
Lymphocytes Absolute: 2 10*3/uL (ref 0.7–3.1)
Lymphs: 21 %
MCH: 26.5 pg — ABNORMAL LOW (ref 26.6–33.0)
MCHC: 33 g/dL (ref 31.5–35.7)
MCV: 80 fL (ref 79–97)
Monocytes Absolute: 0.7 10*3/uL (ref 0.1–0.9)
Monocytes: 7 %
Neutrophils Absolute: 6.7 10*3/uL (ref 1.4–7.0)
Neutrophils: 69 %
PLATELETS: 286 10*3/uL (ref 150–450)
RBC: 5.35 x10E6/uL (ref 4.14–5.80)
RDW: 15.1 % (ref 11.6–15.4)
WBC: 9.7 10*3/uL (ref 3.4–10.8)

## 2018-07-02 LAB — LIPID PANEL
Chol/HDL Ratio: 5.1 ratio — ABNORMAL HIGH (ref 0.0–5.0)
Cholesterol, Total: 188 mg/dL (ref 100–199)
HDL: 37 mg/dL — ABNORMAL LOW (ref >39)
LDL Calculated: 86 mg/dL (ref 0–99)
Triglycerides: 323 mg/dL — ABNORMAL HIGH (ref 0–149)
VLDL Cholesterol Cal: 65 mg/dL — ABNORMAL HIGH (ref 5–40)

## 2018-07-02 LAB — BAYER DCA HB A1C WAIVED: HB A1C: 7.1 % — AB (ref <7.0)

## 2018-07-02 MED ORDER — METOPROLOL TARTRATE 100 MG PO TABS: 100.0000 mg | ORAL_TABLET | Freq: Two times a day (BID) | ORAL | 1 refills | Status: DC

## 2018-07-02 MED ORDER — METFORMIN HCL 500 MG PO TABS: 500.0000 mg | ORAL_TABLET | Freq: Two times a day (BID) | ORAL | 1 refills | Status: DC

## 2018-07-02 MED ORDER — GLIMEPIRIDE 2 MG PO TABS: 2.0000 mg | ORAL_TABLET | Freq: Every day | ORAL | 1 refills | Status: DC

## 2018-07-02 MED ORDER — LISINOPRIL-HYDROCHLOROTHIAZIDE 20-25 MG PO TABS: 1.0000 | ORAL_TABLET | Freq: Every day | ORAL | 1 refills | Status: DC

## 2018-07-02 MED ORDER — GABAPENTIN 300 MG PO CAPS: 300.0000 mg | ORAL_CAPSULE | Freq: Two times a day (BID) | ORAL | 1 refills | Status: DC

## 2018-07-02 MED ORDER — PRAVASTATIN SODIUM 40 MG PO TABS: 40.0000 mg | ORAL_TABLET | Freq: Every day | ORAL | 1 refills | Status: DC

## 2018-07-02 NOTE — Progress Notes (Signed)
Subjective:  Patient ID: Adam Sutton,  male    DOB: 10-23-1955  Age: 63 y.o.    CC: Medical Management of Chronic Issues   HPI [name] presents for  follow-up of hypertension. Patient has no history of headache chest pain or shortness of breath or recent cough. Patient also denies symptoms of TIA such as numbness weakness lateralizing. Patient denies side effects from medication. States taking it regularly.  However, he has been checking his blood pressure at [facility] on occasion and it has been rather high on several occasions.  Patient also  in for follow-up of elevated cholesterol. Doing well without complaints on current medication. Denies side effects  including myalgia and arthralgia and nausea. Also in today for liver function testing. Currently no chest pain, shortness of breath or other cardiovascular related symptoms noted.  Follow-up of diabetes. Patient does check blood sugar at home occasionally.  His fastings have been a little high he tells me.  No log returned.  Patient denies symptoms such as excessive hunger or urinary frequency, excessive hunger, nausea No significant hypoglycemic spells noted. Medications reviewed. Pt reports taking them regularly. Pt. denies complication/adverse reaction today.  Pt experiencing some numbness and tingling in the right shoulder and upper extremity.  The gabapentin has helped a lot.  He wants to hold off on the MRI as well as an NCV which was offered as a intermediate intervention.  Patient also mentions problems with incontinence of urine.  This has been present ever since he had his prostatectomy.  There was significant nerve damage apparently due to the extensiveness of the cancer.  Since then he has had to wear a pad/diaper and he cannot wear shorts or go swimming.  He would like to have a surgical implant to help him with this at sometime in the future but not interested at this time. History Aldrich has a past medical  history of Cancer ([place]), Diabetes mellitus without complication ([place]), Hypertension, Lipidemia, and Sleep apnea (10/14/11).   He has a past surgical history that includes Umbilical hernia repair; bilateral jaw surgery; Robot assisted laparoscopic radical prostatectomy (10/22/2011); Prostate surgery; and Vasectomy.   His family history includes Cancer in his father, maternal grandmother, and mother; Diabetes in his father; Heart disease in his father; Hyperlipidemia in his father; Hypertension in his father.He reports that he has never smoked. He has never used smokeless tobacco. He reports current alcohol use. He reports that he does not use drugs.  Current Outpatient Medications on File Prior to Visit  Medication Sig Dispense Refill  . ONETOUCH VERIO test strip Check glucose twice daily 200 each 11   No current facility-administered medications on file prior to visit.     ROS Review of Systems  Constitutional: Negative.   HENT: Negative.   Eyes: Negative for visual disturbance.  Respiratory: Negative for cough and shortness of breath.   Cardiovascular: Negative for chest pain and leg swelling.  Gastrointestinal: Negative for abdominal pain, diarrhea, nausea and vomiting.  Genitourinary: Negative for difficulty urinating.  Musculoskeletal: Positive for arthralgias and myalgias.  Skin: Negative for rash.  Neurological: Negative for headaches.  Psychiatric/Behavioral: Negative for sleep disturbance.    Objective:  BP (!) 145/85   Pulse 68   Temp 97.7 F (36.5 C) (Oral)   Ht 5' 8"  (1.727 m)   Wt 249 lb 2 oz (113 kg)   BMI 37.88 kg/m   BP Readings from Last 3 Encounters:  07/02/18 (!) 145/85  06/23/18 (!) 162/96  06/17/18 (!) 161/91    Wt Readings from Last 3 Encounters:  07/02/18 249 lb 2 oz (113 kg)  06/23/18 249 lb 6.4 oz (113.1 kg)  06/17/18 251 lb 6.4 oz (114 kg)     Physical Exam Constitutional:      General: He is not in acute distress.    Appearance: He is  well-developed.  HENT:     Head: Normocephalic and atraumatic.     Right Ear: External ear normal.     Left Ear: External ear normal.     Nose: Nose normal.  Eyes:     Conjunctiva/sclera: Conjunctivae normal.     Pupils: Pupils are equal, round, and reactive to light.  Neck:     Musculoskeletal: Normal range of motion and neck supple.  Cardiovascular:     Rate and Rhythm: Normal rate and regular rhythm.     Heart sounds: Normal heart sounds. No murmur.  Pulmonary:     Effort: Pulmonary effort is normal. No respiratory distress.     Breath sounds: Normal breath sounds. No wheezing or rales.  Abdominal:     Palpations: Abdomen is soft.     Tenderness: There is no abdominal tenderness.  Musculoskeletal: Normal range of motion.  Skin:    General: Skin is warm and dry.  Neurological:     Mental Status: He is alert and oriented to person, place, and time.     Deep Tendon Reflexes: Reflexes are normal and symmetric.  Psychiatric:        Behavior: Behavior normal.        Thought Content: Thought content normal.        Judgment: Judgment normal.     Diabetic Foot Exam - Simple   No data filed        Assessment & Plan:   Ladanian was seen today for medical management of chronic issues.  Diagnoses and all orders for this visit:  Diabetes mellitus without complication ([place]) -     Microalbumin / creatinine urine ratio -     Urinalysis -     Bayer DCA Hb A1c Waived  Cervical radiculopathy -     gabapentin (NEURONTIN) 300 MG capsule; Take 1 capsule (300 mg total) by mouth 2 (two) times daily.  Mixed hyperlipidemia -     CBC with Differential/Platelet -     CMP14+EGFR -     Lipid panel  Continuous leakage of urine  Other orders -     glimepiride (AMARYL) 2 MG tablet; Take 1 tablet (2 mg total) by mouth daily with breakfast. -     lisinopril-hydrochlorothiazide (PRINZIDE,ZESTORETIC) 20-25 MG tablet; Take 1 tablet by mouth daily before breakfast. -     metFORMIN  (GLUCOPHAGE) 500 MG tablet; Take 1 tablet (500 mg total) by mouth 2 (two) times daily with a meal. -     metoprolol tartrate (LOPRESSOR) 100 MG tablet; Take 1 tablet (100 mg total) by mouth 2 (two) times daily. -     pravastatin (PRAVACHOL) 40 MG tablet; Take 1 tablet (40 mg total) by mouth at bedtime.   I have changed Amarian Hoston's metoprolol tartrate. I am also having him maintain his ONETOUCH VERIO, gabapentin, glimepiride, lisinopril-hydrochlorothiazide, metFORMIN, and pravastatin.  Meds ordered this encounter  Medications  . gabapentin (NEURONTIN) 300 MG capsule    Sig: Take 1 capsule (300 mg total) by mouth 2 (two) times daily.    Dispense:  180 capsule    Refill:  1  . glimepiride (AMARYL) 2  MG tablet    Sig: Take 1 tablet (2 mg total) by mouth daily with breakfast.    Dispense:  90 tablet    Refill:  1  . lisinopril-hydrochlorothiazide (PRINZIDE,ZESTORETIC) 20-25 MG tablet    Sig: Take 1 tablet by mouth daily before breakfast.    Dispense:  90 tablet    Refill:  1  . metFORMIN (GLUCOPHAGE) 500 MG tablet    Sig: Take 1 tablet (500 mg total) by mouth 2 (two) times daily with a meal.    Dispense:  180 tablet    Refill:  1  . metoprolol tartrate (LOPRESSOR) 100 MG tablet    Sig: Take 1 tablet (100 mg total) by mouth 2 (two) times daily.    Dispense:  180 tablet    Refill:  1  . pravastatin (PRAVACHOL) 40 MG tablet    Sig: Take 1 tablet (40 mg total) by mouth at bedtime.    Dispense:  90 tablet    Refill:  1     Follow-up: Return in about 3 months (around 10/01/2018).  Claretta Fraise, M.D.

## 2018-07-03 LAB — MICROALBUMIN / CREATININE URINE RATIO
Creatinine, Urine: 179.3 mg/dL
Microalb/Creat Ratio: 26.9 mg/g creat (ref 0.0–30.0)
Microalbumin, Urine: 48.2 ug/mL

## 2018-07-08 ENCOUNTER — Ambulatory Visit (HOSPITAL_COMMUNITY): Payer: 59

## 2018-07-19 DIAGNOSIS — D239 Other benign neoplasm of skin, unspecified: Secondary | ICD-10-CM | POA: Insufficient documentation

## 2018-07-21 ENCOUNTER — Ambulatory Visit: Payer: 59 | Admitting: Family Medicine

## 2018-07-23 ENCOUNTER — Other Ambulatory Visit: Payer: Self-pay

## 2018-07-23 ENCOUNTER — Encounter: Payer: Self-pay | Admitting: Family Medicine

## 2018-07-23 ENCOUNTER — Ambulatory Visit: Payer: 59 | Admitting: Family Medicine

## 2018-07-23 VITALS — BP 168/108 | HR 65 | Temp 97.8°F | Ht 68.0 in | Wt 252.8 lb

## 2018-07-23 DIAGNOSIS — M5030 Other cervical disc degeneration, unspecified cervical region: Secondary | ICD-10-CM

## 2018-07-23 DIAGNOSIS — R42 Dizziness and giddiness: Secondary | ICD-10-CM

## 2018-07-23 MED ORDER — MECLIZINE HCL 25 MG PO TABS: 25.0000 mg | ORAL_TABLET | Freq: Three times a day (TID) | ORAL | 2 refills | Status: DC | PRN

## 2018-07-23 NOTE — Progress Notes (Signed)
BP (!) 168/108   Pulse 65   Temp 97.8 F (36.6 C) (Oral)   Ht 5\' 8"  (1.727 m)   Wt 114.7 kg   BMI 38.44 kg/m    Subjective:    Patient ID: Adam Sutton, male    DOB: January 20, 1956, 63 y.o.   MRN: [id number]  HPI: Adam Sutton is a 63 y.o. male presenting on 07/23/2018 for Dizziness (Patient states that it happened one day last week and he would like to have his ears checked.)  Patient is coming in for an episode of dizziness last week. He was coming down from a tall ladder at work with ear plugs in and once he was on the ground, he says the world started spinning. He had to lay down, it lasted about a minute, and he had associated nausea and laying down made it go away.  He denies recent illness, fever, ringing in the ears, hearing loss, or headache. He says his sugars have been running in the 130s and he has not had any lows. He states a similar episode happened one year ago, and they cleaned out a large amount of ear wax from his right ear and gave him meclizine.   Relevant past medical, surgical, family and social history reviewed and updated as indicated. Interim medical history since our last visit reviewed. Allergies and medications reviewed and updated.  Review of Systems  Constitutional: Negative for chills and fever.  HENT: Negative for congestion, ear discharge, ear pain, hearing loss, sinus pressure, sinus pain and tinnitus.   Eyes: Negative for pain and visual disturbance.  Cardiovascular: Negative for chest pain.  Gastrointestinal: Positive for nausea. Negative for vomiting.  Neurological: Positive for dizziness. Negative for syncope and headaches.    Per HPI unless specifically indicated above        Objective:    BP (!) 168/108   Pulse 65   Temp 97.8 F (36.6 C) (Oral)   Ht 5\' 8"  (1.727 m)   Wt 114.7 kg   BMI 38.44 kg/m     Physical Exam Constitutional:      General: He is not in acute distress.    Appearance: Normal appearance.  HENT:   Head: Normocephalic.     Right Ear: Tympanic membrane, ear canal and external ear normal.     Left Ear: Tympanic membrane, ear canal and external ear normal.     Mouth/Throat:     Pharynx: No oropharyngeal exudate or posterior oropharyngeal erythema.  Eyes:     Extraocular Movements: Extraocular movements intact.     Conjunctiva/sclera: Conjunctivae normal.     Pupils: Pupils are equal, round, and reactive to light.  Cardiovascular:     Rate and Rhythm: Normal rate and regular rhythm.     Heart sounds: Normal heart sounds.  Pulmonary:     Breath sounds: Normal breath sounds.       Assessment & Plan:   Patient was educated about the cause of vertigo. He was given a handouts of positional moves he could use if it happens again. Unfortunately there is no way to predict episodes, so he will consider using a safety harness when he climbs tall ladders in the future, and take meclizine as needed.  Problem List Items Addressed This Visit    None    Visit Diagnoses    Vertigo    -  Primary   Relevant Medications   meclizine (ANTIVERT) 25 MG tablet       Follow up plan: Return  if symptoms worsen or fail to improve.  Counseling provided for all of the vaccine components No orders of the defined types were placed in this encounter.   [place], PA-S [facility] 07/23/2018, 9:21 AM Patient seen and examined with PA student, agree with assessment and plan above.  Gave patient handout on maneuvers on how to alleviate vertigo and meclizine and he will be very cautious about when it happens and maybe use some safety harnesses when he is up on high places. Caryl Pina, MD [facility] 07/23/2018, 12:03 PM

## 2018-08-03 ENCOUNTER — Ambulatory Visit (HOSPITAL_COMMUNITY): Payer: 59

## 2018-08-06 ENCOUNTER — Ambulatory Visit (HOSPITAL_COMMUNITY)
Admission: RE | Admit: 2018-08-06 | Discharge: 2018-08-06 | Disposition: A | Discharge status: Home / Self Care | Payer: 59 | Source: Ambulatory Visit | Attending: Family Medicine | Admitting: Family Medicine

## 2018-08-06 ENCOUNTER — Other Ambulatory Visit: Payer: Self-pay | Admitting: *Deleted

## 2018-08-06 DIAGNOSIS — M5030 Other cervical disc degeneration, unspecified cervical region: Secondary | ICD-10-CM | POA: Insufficient documentation

## 2018-08-06 DIAGNOSIS — T148XXA Other injury of unspecified body region, initial encounter: Secondary | ICD-10-CM

## 2018-08-06 DIAGNOSIS — M47812 Spondylosis without myelopathy or radiculopathy, cervical region: Secondary | ICD-10-CM | POA: Diagnosis not present

## 2018-08-22 ENCOUNTER — Observation Stay (HOSPITAL_COMMUNITY)
Admission: EM | Admit: 2018-08-22 | Discharge: 2018-08-23 | Disposition: A | Discharge status: Home / Self Care | Payer: 59 | Attending: Internal Medicine | Admitting: Internal Medicine

## 2018-08-22 ENCOUNTER — Encounter (HOSPITAL_COMMUNITY): Payer: Self-pay | Admitting: Emergency Medicine

## 2018-08-22 ENCOUNTER — Emergency Department (HOSPITAL_COMMUNITY): Payer: 59

## 2018-08-22 ENCOUNTER — Other Ambulatory Visit: Payer: Self-pay

## 2018-08-22 DIAGNOSIS — R42 Dizziness and giddiness: Secondary | ICD-10-CM

## 2018-08-22 DIAGNOSIS — E6609 Other obesity due to excess calories: Secondary | ICD-10-CM | POA: Diagnosis not present

## 2018-08-22 DIAGNOSIS — H8110 Benign paroxysmal vertigo, unspecified ear: Secondary | ICD-10-CM | POA: Diagnosis not present

## 2018-08-22 DIAGNOSIS — M5412 Radiculopathy, cervical region: Secondary | ICD-10-CM

## 2018-08-22 DIAGNOSIS — Z7984 Long term (current) use of oral hypoglycemic drugs: Secondary | ICD-10-CM | POA: Insufficient documentation

## 2018-08-22 DIAGNOSIS — E7800 Pure hypercholesterolemia, unspecified: Secondary | ICD-10-CM | POA: Diagnosis present

## 2018-08-22 DIAGNOSIS — E119 Type 2 diabetes mellitus without complications: Secondary | ICD-10-CM | POA: Diagnosis not present

## 2018-08-22 DIAGNOSIS — I10 Essential (primary) hypertension: Secondary | ICD-10-CM | POA: Diagnosis not present

## 2018-08-22 DIAGNOSIS — Z6837 Body mass index (BMI) 37.0-37.9, adult: Secondary | ICD-10-CM | POA: Diagnosis not present

## 2018-08-22 DIAGNOSIS — R112 Nausea with vomiting, unspecified: Secondary | ICD-10-CM

## 2018-08-22 DIAGNOSIS — Z8546 Personal history of malignant neoplasm of prostate: Secondary | ICD-10-CM | POA: Diagnosis not present

## 2018-08-22 DIAGNOSIS — Z79899 Other long term (current) drug therapy: Secondary | ICD-10-CM | POA: Diagnosis not present

## 2018-08-22 HISTORY — DX: Cervical disc disorder with radiculopathy, unspecified cervical region: M50.10

## 2018-08-22 HISTORY — DX: Dizziness and giddiness: R42

## 2018-08-22 LAB — CBC
HCT: 48.3 % (ref 39.0–52.0)
Hemoglobin: 14.9 g/dL (ref 13.0–17.0)
MCH: 25.7 pg — AB (ref 26.0–34.0)
MCHC: 30.8 g/dL (ref 30.0–36.0)
MCV: 83.3 fL (ref 80.0–100.0)
Platelets: 265 10*3/uL (ref 150–400)
RBC: 5.8 MIL/uL (ref 4.22–5.81)
RDW: 15.5 % (ref 11.5–15.5)
WBC: 7.5 10*3/uL (ref 4.0–10.5)
nRBC: 0 % (ref 0.0–0.2)

## 2018-08-22 LAB — URINALYSIS, ROUTINE W REFLEX MICROSCOPIC
BACTERIA UA: NONE SEEN
Bilirubin Urine: NEGATIVE
Glucose, UA: 50 mg/dL — AB
Hgb urine dipstick: NEGATIVE
Ketones, ur: 20 mg/dL — AB
Leukocytes,Ua: NEGATIVE
Nitrite: NEGATIVE
Protein, ur: 30 mg/dL — AB
Specific Gravity, Urine: 1.019 (ref 1.005–1.030)
pH: 7 (ref 5.0–8.0)

## 2018-08-22 LAB — BASIC METABOLIC PANEL
Anion gap: 9 (ref 5–15)
BUN: 17 mg/dL (ref 8–23)
CO2: 28 mmol/L (ref 22–32)
Calcium: 8.8 mg/dL — ABNORMAL LOW (ref 8.9–10.3)
Chloride: 101 mmol/L (ref 98–111)
Creatinine, Ser: 0.78 mg/dL (ref 0.61–1.24)
GFR calc Af Amer: >60 mL/min (ref >60)
GFR calc non Af Amer: >60 mL/min (ref >60)
Glucose, Bld: 151 mg/dL — ABNORMAL HIGH (ref 70–99)
Potassium: 4.1 mmol/L (ref 3.5–5.1)
Sodium: 138 mmol/L (ref 135–145)

## 2018-08-22 LAB — HEPATIC FUNCTION PANEL
ALT: 20 U/L (ref 0–44)
AST: 25 U/L (ref 15–41)
Albumin: 4.4 g/dL (ref 3.5–5.0)
Alkaline Phosphatase: 84 U/L (ref 38–126)
Bilirubin, Direct: 0.1 mg/dL (ref 0.0–0.2)
Indirect Bilirubin: 1.1 mg/dL — ABNORMAL HIGH (ref 0.3–0.9)
Total Bilirubin: 1.2 mg/dL (ref 0.3–1.2)
Total Protein: 7.9 g/dL (ref 6.5–8.1)

## 2018-08-22 LAB — LIPASE, BLOOD: Lipase: 26 U/L (ref 11–51)

## 2018-08-22 LAB — MAGNESIUM: Magnesium: 2.1 mg/dL (ref 1.7–2.4)

## 2018-08-22 LAB — TROPONIN I: Troponin I: <0.03 ng/mL (ref <0.03)

## 2018-08-22 MED ORDER — DIPHENHYDRAMINE HCL 50 MG/ML IJ SOLN: 12.5000 mg | Freq: Once | INTRAMUSCULAR | Status: DC

## 2018-08-22 MED ORDER — HYDROCHLOROTHIAZIDE 25 MG PO TABS
25.0000 mg | ORAL_TABLET | Freq: Every day | ORAL | Status: DC
  Administered 2018-08-23: 25 mg via ORAL
  Filled 2018-08-22: qty 1

## 2018-08-22 MED ORDER — ONDANSETRON HCL 4 MG/2ML IJ SOLN: 4.0000 mg | Freq: Four times a day (QID) | INTRAMUSCULAR | Status: DC | PRN

## 2018-08-22 MED ORDER — METOPROLOL TARTRATE 50 MG PO TABS
100.0000 mg | ORAL_TABLET | Freq: Two times a day (BID) | ORAL | Status: DC
  Administered 2018-08-23: 100 mg via ORAL
  Filled 2018-08-22: qty 2

## 2018-08-22 MED ORDER — SODIUM CHLORIDE 0.9 % IV SOLN: INTRAVENOUS | Status: DC

## 2018-08-22 MED ORDER — LISINOPRIL-HYDROCHLOROTHIAZIDE 20-25 MG PO TABS: 1.0000 | ORAL_TABLET | Freq: Every day | ORAL | Status: DC

## 2018-08-22 MED ORDER — SODIUM CHLORIDE 0.9% FLUSH
3.0000 mL | Freq: Once | INTRAVENOUS | Status: AC
  Administered 2018-08-22: 3 mL via INTRAVENOUS

## 2018-08-22 MED ORDER — ACETAMINOPHEN 650 MG RE SUPP: 650.0000 mg | Freq: Four times a day (QID) | RECTAL | Status: DC | PRN

## 2018-08-22 MED ORDER — PROMETHAZINE HCL 25 MG/ML IJ SOLN
12.5000 mg | Freq: Once | INTRAMUSCULAR | Status: AC
  Administered 2018-08-22: 12.5 mg via INTRAVENOUS
  Filled 2018-08-22: qty 1

## 2018-08-22 MED ORDER — PROMETHAZINE HCL 25 MG/ML IJ SOLN: 12.5000 mg | Freq: Four times a day (QID) | INTRAMUSCULAR | Status: DC | PRN

## 2018-08-22 MED ORDER — SODIUM CHLORIDE 0.9 % IV BOLUS
1000.0000 mL | Freq: Once | INTRAVENOUS | Status: AC
  Administered 2018-08-22: 1000 mL via INTRAVENOUS

## 2018-08-22 MED ORDER — GABAPENTIN 300 MG PO CAPS
300.0000 mg | ORAL_CAPSULE | Freq: Two times a day (BID) | ORAL | Status: DC
  Administered 2018-08-23: 300 mg via ORAL
  Filled 2018-08-22: qty 1

## 2018-08-22 MED ORDER — ENOXAPARIN SODIUM 40 MG/0.4ML ~~LOC~~ SOLN: 40.0000 mg | SUBCUTANEOUS | Status: DC

## 2018-08-22 MED ORDER — ONDANSETRON HCL 4 MG/2ML IJ SOLN
4.0000 mg | INTRAMUSCULAR | Status: DC | PRN
  Administered 2018-08-22: 4 mg via INTRAVENOUS
  Filled 2018-08-22: qty 2

## 2018-08-22 MED ORDER — LORAZEPAM 2 MG/ML IJ SOLN
0.5000 mg | INTRAMUSCULAR | Status: DC | PRN
  Administered 2018-08-22: 1 mg via INTRAVENOUS
  Filled 2018-08-22: qty 1

## 2018-08-22 MED ORDER — GABAPENTIN 300 MG PO CAPS
300.0000 mg | ORAL_CAPSULE | Freq: Once | ORAL | Status: AC
  Administered 2018-08-22: 300 mg via ORAL
  Filled 2018-08-22: qty 1

## 2018-08-22 MED ORDER — INSULIN ASPART 100 UNIT/ML ~~LOC~~ SOLN
0.0000 [IU] | Freq: Three times a day (TID) | SUBCUTANEOUS | Status: DC
  Administered 2018-08-23: 1 [IU] via SUBCUTANEOUS

## 2018-08-22 MED ORDER — ACETAMINOPHEN 325 MG PO TABS: 650.0000 mg | ORAL_TABLET | Freq: Four times a day (QID) | ORAL | Status: DC | PRN

## 2018-08-22 MED ORDER — MECLIZINE HCL 12.5 MG PO TABS
25.0000 mg | ORAL_TABLET | Freq: Three times a day (TID) | ORAL | Status: DC | PRN
  Administered 2018-08-22 – 2018-08-23 (×2): 25 mg via ORAL
  Filled 2018-08-22 (×2): qty 2

## 2018-08-22 MED ORDER — LISINOPRIL 10 MG PO TABS
20.0000 mg | ORAL_TABLET | Freq: Every day | ORAL | Status: DC
  Administered 2018-08-23: 20 mg via ORAL
  Filled 2018-08-22: qty 2

## 2018-08-22 MED ORDER — MECLIZINE HCL 12.5 MG PO TABS
25.0000 mg | ORAL_TABLET | Freq: Once | ORAL | Status: AC
  Administered 2018-08-22: 25 mg via ORAL
  Filled 2018-08-22: qty 2

## 2018-08-22 MED ORDER — PRAVASTATIN SODIUM 40 MG PO TABS: 40.0000 mg | ORAL_TABLET | Freq: Every day | ORAL | Status: DC

## 2018-08-22 MED ORDER — DIAZEPAM 5 MG PO TABS
5.0000 mg | ORAL_TABLET | Freq: Once | ORAL | Status: AC
  Administered 2018-08-22: 5 mg via ORAL
  Filled 2018-08-22: qty 1

## 2018-08-22 MED ORDER — SODIUM CHLORIDE 0.9 % IV SOLN
INTRAVENOUS | Status: DC
  Administered 2018-08-22 – 2018-08-23 (×2): via INTRAVENOUS

## 2018-08-22 NOTE — H&P (Signed)
History and Physical    PLEASE NOTE THAT DRAGON DICTATION SOFTWARE WAS USED IN THE CONSTRUCTION OF THIS NOTE.   Adam Sutton VOZ:[id number] DOB: 1955/07/17 DOA: 08/22/2018  PCP: Claretta Fraise, MD Patient coming from: home  I have personally briefly reviewed patient's old medical records in [place]  Chief Complaint: "room spinning"  HPI: Adam Sutton is a 63 y.o. male with medical history significant for benign paroxysmal positional vertigo, hypertension, type 2 diabetes mellitus, prostate cancer status post radical prostatectomy in 2013, who is admitted to [facility] on 08/22/2018 with vertigo after presenting from home to [facility] ED complaining of the sensation of "room spinning".   The patient reports that he was originally diagnosed with benign paroxysmal positional vertigo approximately 1 year ago following episode of vertigo associated with standing as well as head rotation and associated with nausea.  At that time he was prescribed as needed meclizine.  Subsequent to initial diagnosis, the patient reports that he experienced 5 similar episodes of vertigo leading up to today, each lasting only a few minutes and improving with PRN use of aforementioned meclizine.   Around 4 AM this morning, the patient reports that he awoke needing to urinate, which she reports was not out of the ordinary for him at that time of the morning.  However, upon standing up at the side of bed, he reports that he developed sudden onset of the sensation that the "room was spinning", and noted associated nausea preventing him from taking his typical prn Meclizine in the setting of these symptoms.  He believes that the room-spinning sensation has been constant since onset, and reports exacerbation of vertigo and nausea with subsequent attempts to rise from a standing position as well as with rotation of the head.  The patient denies any associated acute focal weakness, numbness, paresthesias,  change in vision, dysphagia, dysarthria, facial droop.  He also denies any associated chest pain, shortness of breath, palpitations, presyncope, or syncope.  No recent change in hearing, and also denies any recent trauma.  Denies any preceding rhinitis, rhinorrhea, sore throat, or cough.  Denies any otalgia. In terms of medications that can be associated with vertigo, the patient reports that he does not take aspirin as an outpatient, nor has he been on any antibiotics over the last year.  As additional work-up for his year-long history of recurrent episodes of vertigo, the patient reports that he has not previously undergone MRI of the brain.   ED Course: Vital signs in the ED were notable for the following: Temperature max 98.7; heart rate 64-69; blood pressure ranged from 154/96-150 8/91; respiratory rate 15-18, and oxygen saturation 98 to 100% on room air.  Labs performed in the ED were notable for the following: CMP notable for sodium 138, bicarbonate 28, creatinine 0.78, and glucose 151.  Troponin IX x1-.  CBC notable for white blood cell count of 7500.  Urinalysis showed no white blood cells, and was also noted to be nitrite negative as well as negative for leukocyte esterase.  Noncontrast CT of the head, per final radiology report showed no evidence of acute intracranial process, including no evidence of ischemia or intracranial hemorrhage.  While still in the ED, the following were administered: Zofran 4 mg IV x1, Phenergan 12.5 mg IV x1, Valium 5 mg p.o. x1, and meclizine 25 mg p.o. x1.  Additionally, the patient received a 1 L IV normal saline bolus.  Subsequently, in the setting of ongoing vertigo, associated nausea, and  inability to take p.o., the patient was admitted for overnight observation for symptomatic management of presenting vertigo as well as for additional evaluation via MRI brain.    Review of Systems: As per HPI otherwise 10 point review of systems negative.   Past Medical  History:  Diagnosis Date  . Cancer [facility])    prostate cancer  . Cervical disc disorder with radiculopathy    right arm numbness  . Diabetes mellitus without complication ([place])   . Hypertension   . Lipidemia   . Sleep apnea 10/14/11   STOP BANG SCORE 4  . Vertigo     Past Surgical History:  Procedure Laterality Date  . bilateral jaw surgery     40+ yrs ago- for TMJ PROBLEMS AND WISDOM TEETH IMPACTIONS  . PROSTATE SURGERY    . ROBOT ASSISTED LAPAROSCOPIC RADICAL PROSTATECTOMY  10/22/2011   Procedure: ROBOTIC ASSISTED LAPAROSCOPIC RADICAL PROSTATECTOMY;  Surgeon: Molli Hazard, MD;  Location: [facility] ORS;  Service: Urology;  Laterality: N/A;     . UMBILICAL HERNIA REPAIR    . VASECTOMY      Social History:  reports that he has never smoked. He has never used smokeless tobacco. He reports current alcohol use. He reports that he does not use drugs.   Allergies  Allergen Reactions  . Tape Other (See Comments)    blisters  . Sulfamethoxazole-Trimethoprim Rash    Family History  Problem Relation Age of Onset  . Cancer Mother        lung  . Cancer Father        prostate  . Diabetes Father   . Heart disease Father   . Hyperlipidemia Father   . Hypertension Father   . Cancer Maternal Grandmother        breast     Prior to Admission medications   Medication Sig Start Date End Date Taking? Authorizing Provider  gabapentin (NEURONTIN) 300 MG capsule Take 1 capsule (300 mg total) by mouth 2 (two) times daily. 07/02/18  Yes Claretta Fraise, MD  glimepiride (AMARYL) 2 MG tablet Take 1 tablet (2 mg total) by mouth daily with breakfast. 07/02/18  Yes Stacks, Cletus Gash, MD  lisinopril-hydrochlorothiazide (PRINZIDE,ZESTORETIC) 20-25 MG tablet Take 1 tablet by mouth daily before breakfast. 07/02/18  Yes Claretta Fraise, MD  meclizine (ANTIVERT) 25 MG tablet Take 1 tablet (25 mg total) by mouth 3 (three) times daily as needed for dizziness. 07/23/18  Yes Dettinger, Fransisca Kaufmann, MD  metFORMIN  (GLUCOPHAGE) 500 MG tablet Take 1 tablet (500 mg total) by mouth 2 (two) times daily with a meal. 07/02/18  Yes Claretta Fraise, MD  metoprolol tartrate (LOPRESSOR) 100 MG tablet Take 1 tablet (100 mg total) by mouth 2 (two) times daily. 07/02/18  Yes Claretta Fraise, MD  [facility] VERIO test strip Check glucose twice daily 03/29/18  Yes Claretta Fraise, MD  pravastatin (PRAVACHOL) 40 MG tablet Take 1 tablet (40 mg total) by mouth at bedtime. 07/02/18  Yes Claretta Fraise, MD     Objective     Physical Exam: Vitals:   08/22/18 1348 08/22/18 1530 08/22/18 1616 08/22/18 1725  BP: (!) 154/96 (!) 158/91  (!) 162/95  Pulse: 64 65 69 65  Resp: 18 15 13 14   Temp: 98.7 F (37.1 C)     TempSrc: Oral     SpO2: 99% 98% 100% 100%  Weight:      Height:        General: appears to be stated age; alert, oriented Skin:  warm, dry, no rash Head:  AT/[place] Eyes:  PEARL b/l; horizontal nystagmus noted with rotation of head, slightly more prominent with right rotation. Mouth:  Oral mucosa membranes appear dry, normal dentition Neck: supple; trachea midline Heart:  RRR; did not appreciate any M/R/G Lungs: CTAB, did not appreciate any wheezes, rales, or rhonchi Abdomen: + BS; soft, ND, NT Extremities: no peripheral edema, no muscle wasting Neuro: 5/5 strength of the proximal and distal flexors and extensors of the upper and lower extremities bilaterally; sensation intact in upper and lower extremities b/l; cranial nerves II through XII grossly intact; no pronator drift; no evidence suggestive of slurred speech, dysarthria, or facial droop; Normal muscle tone. No tremors.    Labs on Admission: I have personally reviewed following labs and imaging studies  CBC: Recent Labs  Lab 08/22/18 1351  WBC 7.5  HGB 14.9  HCT 48.3  MCV 83.3  PLT 751   Basic Metabolic Panel: Recent Labs  Lab 08/22/18 1351  NA 138  K 4.1  CL 101  CO2 28  GLUCOSE 151*  BUN 17  CREATININE 0.78  CALCIUM 8.8*    GFR: Estimated Creatinine Clearance: 112.8 mL/min (by C-G formula based on SCr of 0.78 mg/dL). Liver Function Tests: Recent Labs  Lab 08/22/18 1351  AST 25  ALT 20  ALKPHOS 84  BILITOT 1.2  PROT 7.9  ALBUMIN 4.4   Recent Labs  Lab 08/22/18 1351  LIPASE 26   No results for input(s): AMMONIA in the last 168 hours. Coagulation Profile: No results for input(s): INR, PROTIME in the last 168 hours. Cardiac Enzymes: Recent Labs  Lab 08/22/18 1351  TROPONINI <0.03   BNP (last 3 results) No results for input(s): PROBNP in the last 8760 hours. HbA1C: No results for input(s): HGBA1C in the last 72 hours. CBG: No results for input(s): GLUCAP in the last 168 hours. Lipid Profile: No results for input(s): CHOL, HDL, LDLCALC, TRIG, CHOLHDL, LDLDIRECT in the last 72 hours. Thyroid Function Tests: No results for input(s): TSH, T4TOTAL, FREET4, T3FREE, THYROIDAB in the last 72 hours. Anemia Panel: No results for input(s): VITAMINB12, FOLATE, FERRITIN, TIBC, IRON, RETICCTPCT in the last 72 hours. Urine analysis:    Component Value Date/Time   COLORURINE YELLOW 08/22/2018 1530   APPEARANCEUR CLEAR 08/22/2018 1530   APPEARANCEUR Clear 07/02/2018 0926   LABSPEC 1.019 08/22/2018 1530   PHURINE 7.0 08/22/2018 1530   GLUCOSEU 50 (A) 08/22/2018 1530   HGBUR NEGATIVE 08/22/2018 1530   BILIRUBINUR NEGATIVE 08/22/2018 1530   BILIRUBINUR Negative 07/02/2018 0926   KETONESUR 20 (A) 08/22/2018 1530   PROTEINUR 30 (A) 08/22/2018 1530   NITRITE NEGATIVE 08/22/2018 1530   LEUKOCYTESUR NEGATIVE 08/22/2018 1530    Radiological Exams on Admission: Dg Chest 2 View  Result Date: 08/22/2018 CLINICAL DATA:  Vertigo, nausea, vomiting EXAM: CHEST - 2 VIEW COMPARISON:  10/14/2011 chest radiograph. FINDINGS: Stable cardiomediastinal silhouette with mild cardiomegaly. No pneumothorax. No pleural effusion. Lungs appear clear, with no acute consolidative airspace disease and no pulmonary edema.  IMPRESSION: Stable mild cardiomegaly without pulmonary edema. No active pulmonary disease. Electronically Signed   By: Ilona Sorrel M.D.   On: 08/22/2018 15:33   Ct Head Wo Contrast  Result Date: 08/22/2018 CLINICAL DATA:  Vertigo EXAM: CT HEAD WITHOUT CONTRAST TECHNIQUE: Contiguous axial images were obtained from the base of the skull through the vertex without intravenous contrast. COMPARISON:  None. FINDINGS: Brain: No evidence of acute infarction, hemorrhage, hydrocephalus, extra-axial collection or mass lesion/mass effect. Vascular:  No hyperdense vessel or unexpected calcification. Skull: Normal. Negative for fracture or focal lesion. Sinuses/Orbits: No acute finding. Other: None. IMPRESSION: No acute intracranial pathology. Electronically Signed   By: Eddie Candle M.D.   On: 08/22/2018 15:49     Assessment/Plan   Adam Sutton is a 63 y.o. male with medical history significant for benign paroxysmal positional vertigo, hypertension, type 2 diabetes mellitus, prostate cancer status post radical prostatectomy in 2013, who is admitted to [facility] on 08/22/2018 with vertigo after presenting from home to [facility] ED complaining of the sensation of "room spinning".    Principal Problem:   Vertigo Active Problems:   Hypertension   Diabetes mellitus without complication (HCC)   Pure hypercholesterolemia    #) Benign paroxysmal positional vertigo: Diagnosis on the basis of patient's report of less than 12 hours of sensation of "room spinning" associated with nausea/vomiting, worsening with rotation of the head, standing, and finding of horizontal nystagmus upon Dix-Hallpike maneuver.  This is in the context of a history of benign paroxysmal positional vertigo, with first such episode occurring approximately 1 year ago and followed by 5 subsequent similar episodes leading up to today.  Prior episodes, will qualitatively similar to today's presentation, appear to have been less severe in  nature, shorter lasting, and were managed as an outpatient via PRN meclizine.  However, in the setting of the patient's persistent symptoms and associated nausea/vomiting preventing oral intake at this time, the decision was made to admit the patient for overnight observation for symptomatic management and additional evaluation of underlying etiology.  Of note, the patient has not previously underwent MRI of the brain as work-up for his history of vertigo.  In the context of the recurrent nature of patient's vertigo episodes, additional radiographic evaluation via MRI brain will be pursued. Of note, aside from vertigo, presentation not associated with any acute focal neurologic deficits, including no acute focal weakness, numbness, paresthesias, dysphagia, dysarthria, or facial droop. Symptoms not associated with any recent loss of hearing, nor any preceding trauma.  In terms of pharmacologic management of the vertiginous symptoms proper, it appears that the patient has been unable to keep down prior doses of PO antivert. Consequently, will administer a one-time dose of diphenhydramine intravenously to make use of its anticholinergic properties.   Plan: diphenhydramine 12.5 mg IV x 1, as above. Prn Meclizine. Prn Zofran. Prn Phenergan. MRI brain in the morning, as above.  Could also consider scopolamine patch for anticholinergic properties should nausea/vomiting prohibit subsequent PRN meclizine administration.    #) Essential Hypertension: On the following antihypertensive agents as an outpatient: Lisinopril, HCTZ, and Lopressor.  Presenting systolic blood pressure noted to be in the 150s.  As the patient is currently unable to tolerate p.o. in the setting of associated nausea/vomiting stemming from presenting vertigo, I have elected to hold his home antihypertensive medications overnight, with plan to resume in the morning.   Plan: I have ordered home antihypertensive medications to be resumed tomorrow, as  above.  Monitor ensuing blood pressure via routine vital signs.    #) Hyperlipidemia: On pravastatin at home.  Plan: Resume home pravastatin tomorrow.    #) Type 2 diabetes mellitus: On metformin as well as glimepiride as an outpatient.  Not on exogenous insulin at this time.  Presenting blood sugar per presenting CMP noted to be 151.  Plan: We will hold home oral hypoglycemic agents.  Accu-Cheks q. before meals and at bedtime with associated sliding scale insulin.  DVT prophylaxis: scd's Code Status: full Family Communication: case discussed with patient's wife, who is present at bedside. Disposition Plan:  Per Rounding Team Consults called: (none) Admission status: Observation; med telemetry   PLEASE NOTE THAT DRAGON DICTATION SOFTWARE WAS USED IN THE CONSTRUCTION OF THIS NOTE.   [place] [facility] Pager [phone] From 3PM- 11PM.   Otherwise, please contact night-coverage  [email or web address] Password [facility]  08/22/2018, 5:36 PM

## 2018-08-22 NOTE — ED Triage Notes (Signed)
Patient c/o dizziness that started this morning at 4am with nausea and vomiting. Per patient "feels like the room is spin." Per patient dizziness worse with movement. Patient reports hx of dizziness in which he takes meclizine but states that his dizziness has never been this severe. Per patient he has tried to take his medication but has been unable to keep it down due to the nausea and vomiting. Denies any headache, slurred speech, facial drooping, or weakness. Patient does report numbness in right arm that he is being seen for already.

## 2018-08-22 NOTE — ED Provider Notes (Signed)
[facility] Provider Note   CSN: [id number] Arrival date & time: 08/22/18  1312    History   Chief Complaint Chief Complaint  Patient presents with  . Dizziness    HPI Adam Sutton is a 63 y.o. male.      Dizziness    Pt was seen at 1405. Per pt, c/o sudden onset and persistence of constant "everything is spinning" that he noticed at 4am when he got up from sleep to go to the bathroom to urinate (his usual routine, per pt). LKW 2200 when he went to bed. Pt states "everything is moving around," worsens with turning/moving his head or eyes. States if he keeps still and keeps his eyes closed his symptoms improve. Has been associated with multiple intermittent episodes of N/V. Pt states he has had similar symptoms, dx vertigo, rx antivert. Pt was unable to take this medication due to N/V.  Denies CP/palpitations, no SOB/cough, no abd pain, no diarrhea, no black or blood in emesis, no visual changes, no focal motor weakness, no tingling/numbness in extremities, no ataxia, no slurred speech, no facial droop.    Past Medical History:  Diagnosis Date  . Cancer [facility])    prostate cancer  . Cervical disc disorder with radiculopathy    right arm numbness  . Diabetes mellitus without complication ([place])   . Hypertension   . Lipidemia   . Sleep apnea 10/14/11   STOP BANG SCORE 4  . Vertigo     Patient Active Problem List   Diagnosis Date Noted  . Dysplastic nevi 07/19/2018  . Bleeding hemorrhoid 10/27/2017  . BMI 37.0-37.9, adult 10/27/2017  . Concussion without loss of consciousness 10/27/2017  . Keratoacanthoma 10/27/2017  . Paronychia of finger of left hand 10/27/2017  . Personal history of prostate cancer 10/27/2017  . Pure hypercholesterolemia 10/27/2017  . Continuous leakage of urine 10/27/2017  . Hypertension 08/17/2017  . Diabetes mellitus without complication ([place]) 86/76/1952  . Lipidemia 08/17/2017    Past Surgical History:  Procedure  Laterality Date  . bilateral jaw surgery     40+ yrs ago- for TMJ PROBLEMS AND WISDOM TEETH IMPACTIONS  . PROSTATE SURGERY    . ROBOT ASSISTED LAPAROSCOPIC RADICAL PROSTATECTOMY  10/22/2011   Procedure: ROBOTIC ASSISTED LAPAROSCOPIC RADICAL PROSTATECTOMY;  Surgeon: Molli Hazard, MD;  Location: [facility];  Service: Urology;  Laterality: N/A;     . UMBILICAL HERNIA REPAIR    . VASECTOMY          Home Medications    Prior to Admission medications   Medication Sig Start Date End Date Taking? Authorizing Provider  gabapentin (NEURONTIN) 300 MG capsule Take 1 capsule (300 mg total) by mouth 2 (two) times daily. 07/02/18  Yes Claretta Fraise, MD  glimepiride (AMARYL) 2 MG tablet Take 1 tablet (2 mg total) by mouth daily with breakfast. 07/02/18  Yes Stacks, Cletus Gash, MD  lisinopril-hydrochlorothiazide (PRINZIDE,ZESTORETIC) 20-25 MG tablet Take 1 tablet by mouth daily before breakfast. 07/02/18  Yes Claretta Fraise, MD  meclizine (ANTIVERT) 25 MG tablet Take 1 tablet (25 mg total) by mouth 3 (three) times daily as needed for dizziness. 07/23/18  Yes Dettinger, Fransisca Kaufmann, MD  metFORMIN (GLUCOPHAGE) 500 MG tablet Take 1 tablet (500 mg total) by mouth 2 (two) times daily with a meal. 07/02/18  Yes Claretta Fraise, MD  metoprolol tartrate (LOPRESSOR) 100 MG tablet Take 1 tablet (100 mg total) by mouth 2 (two) times daily. 07/02/18  Yes Claretta Fraise, MD  [facility]  VERIO test strip Check glucose twice daily 03/29/18  Yes Stacks, Cletus Gash, MD  pravastatin (PRAVACHOL) 40 MG tablet Take 1 tablet (40 mg total) by mouth at bedtime. 07/02/18  Yes Claretta Fraise, MD    Family History Family History  Problem Relation Age of Onset  . Cancer Mother        lung  . Cancer Father        prostate  . Diabetes Father   . Heart disease Father   . Hyperlipidemia Father   . Hypertension Father   . Cancer Maternal Grandmother        breast    Social History Social History   Tobacco Use  . Smoking status: Never  Smoker  . Smokeless tobacco: Never Used  Substance Use Topics  . Alcohol use: Yes    Comment: OCCAS BEER  . Drug use: No     Allergies   Tape and Sulfamethoxazole-trimethoprim   Review of Systems Review of Systems  Neurological: Positive for dizziness.  ROS: Statement: All systems negative except as marked or noted in the HPI; Constitutional: Negative for fever and chills. ; ; Eyes: Negative for eye pain, redness and discharge. ; ; ENMT: Negative for ear pain, hoarseness, nasal congestion, sinus pressure and sore throat. ; ; Cardiovascular: Negative for chest pain, palpitations, diaphoresis, dyspnea and peripheral edema. ; ; Respiratory: Negative for cough, wheezing and stridor. ; ; Gastrointestinal: +N/V. Negative for diarrhea, abdominal pain, blood in stool, hematemesis, jaundice and rectal bleeding. . ; ; Genitourinary: Negative for dysuria, flank pain and hematuria. ; ; Musculoskeletal: Negative for back pain and neck pain. Negative for swelling and trauma.; ; Skin: Negative for pruritus, rash, abrasions, blisters, bruising and skin lesion.; ; Neuro: +"dizziness." Negative for headache, lightheadedness and neck stiffness. Negative for weakness, altered level of consciousness, altered mental status, extremity weakness, paresthesias, involuntary movement, seizure and syncope.      Physical Exam Updated Vital Signs BP (!) 154/96 (BP Location: Left Arm)   Pulse 64   Temp 98.7 F (37.1 C) (Oral)   Resp 18   Ht 5\' 8"  (1.727 m)   Wt 111.1 kg   SpO2 99%   BMI 37.25 kg/m   Physical Exam 1410: Physical examination:  Nursing notes reviewed; Vital signs and O2 SAT reviewed;  Constitutional: Well developed, Well nourished, Well hydrated, In no acute distress; Head:  Normocephalic, atraumatic; Eyes: EOMI, PERRL, No scleral icterus; ENMT: TM's clear bilat. +edemetous nasal turbinates bilat with clear rhinorrhea. Mouth and pharynx without lesions. No tonsillar exudates. No intra-oral edema. No  submandibular or sublingual edema. No hoarse voice, no drooling, no stridor. No pain with manipulation of larynx. No trismus. Mouth and pharynx normal, Mucous membranes moist; Neck: Supple, Full range of motion, No lymphadenopathy; Cardiovascular: Regular rate and rhythm, No gallop; Respiratory: Breath sounds clear & equal bilaterally, No wheezes.  Speaking full sentences with ease, Normal respiratory effort/excursion; Chest: Nontender, Movement normal; Abdomen: Soft, Nontender, Nondistended, Normal bowel sounds; Genitourinary: No CVA tenderness; Extremities: Peripheral pulses normal, No tenderness, No edema, No calf edema or asymmetry.; Neuro: AA&Ox3, Major CN grossly intact. Speech clear.  No facial droop.  +left horizontal gaze nystagmus. Grips equal. Strength 5/5 equal bilat UE's and LE's.  DTR 2/4 equal bilat UE's and LE's.  No gross sensory deficits.  Normal cerebellar testing bilat UE's (finger-nose) and LE's (heel-shin)..; Skin: Color normal, Warm, Dry.     ED Treatments / Results  Labs (all labs ordered are listed, but only abnormal results  are displayed)   EKG EKG Interpretation  Date/Time:  Sunday August 22 2018 13:32:38 EDT Ventricular Rate:  67 PR Interval:    QRS Duration: 127 QT Interval:  460 QTC Calculation: 486 R Axis:   -10 Text Interpretation:  Sinus rhythm Nonspecific intraventricular conduction delay When compared with ECG of 10/14/2011 No significant change was found Confirmed by Francine Graven [id number]) on 08/22/2018 2:33:40 PM   Radiology   Procedures Procedures (including critical care time)  Medications Ordered in ED Medications  ondansetron (ZOFRAN) injection 4 mg (has no administration in time range)  meclizine (ANTIVERT) tablet 25 mg (has no administration in time range)  sodium chloride 0.9 % bolus 1,000 mL (has no administration in time range)  sodium chloride flush (NS) 0.9 % injection 3 mL (3 mLs Intravenous Given 08/22/18 1352)     Initial  Impression / Assessment and Plan / ED Course  I have reviewed the triage vital signs and the nursing notes.  Pertinent labs & imaging results that were available during my care of the patient were reviewed by me and considered in my medical decision making (see chart for details).     MDM Reviewed: previous chart, nursing note and vitals Reviewed previous: labs, ECG and MRI Interpretation: labs, ECG, x-ray and CT scan   Results for orders placed or performed during the hospital encounter of 60/45/40  Basic metabolic panel  Result Value Ref Range   Sodium 138 135 - 145 mmol/L   Potassium 4.1 3.5 - 5.1 mmol/L   Chloride 101 98 - 111 mmol/L   CO2 28 22 - 32 mmol/L   Glucose, Bld 151 (H) 70 - 99 mg/dL   BUN 17 8 - 23 mg/dL   Creatinine, Ser 0.78 0.61 - 1.24 mg/dL   Calcium 8.8 (L) 8.9 - 10.3 mg/dL   GFR calc non Af Amer >60 >60 mL/min   GFR calc Af Amer >60 >60 mL/min   Anion gap 9 5 - 15  CBC  Result Value Ref Range   WBC 7.5 4.0 - 10.5 K/uL   RBC 5.80 4.22 - 5.81 MIL/uL   Hemoglobin 14.9 13.0 - 17.0 g/dL   HCT 48.3 39.0 - 52.0 %   MCV 83.3 80.0 - 100.0 fL   MCH 25.7 (L) 26.0 - 34.0 pg   MCHC 30.8 30.0 - 36.0 g/dL   RDW 15.5 11.5 - 15.5 %   Platelets 265 150 - 400 K/uL   nRBC 0.0 0.0 - 0.2 %  Urinalysis, Routine w reflex microscopic  Result Value Ref Range   Color, Urine YELLOW YELLOW   APPearance CLEAR CLEAR   Specific Gravity, Urine 1.019 1.005 - 1.030   pH 7.0 5.0 - 8.0   Glucose, UA 50 (A) NEGATIVE mg/dL   Hgb urine dipstick NEGATIVE NEGATIVE   Bilirubin Urine NEGATIVE NEGATIVE   Ketones, ur 20 (A) NEGATIVE mg/dL   Protein, ur 30 (A) NEGATIVE mg/dL   Nitrite NEGATIVE NEGATIVE   Leukocytes,Ua NEGATIVE NEGATIVE   RBC / HPF 0-5 0 - 5 RBC/hpf   WBC, UA 0-5 0 - 5 WBC/hpf   Bacteria, UA NONE SEEN NONE SEEN   Mucus PRESENT   Hepatic function panel  Result Value Ref Range   Total Protein 7.9 6.5 - 8.1 g/dL   Albumin 4.4 3.5 - 5.0 g/dL   AST 25 15 - 41 U/L   ALT  20 0 - 44 U/L   Alkaline Phosphatase 84 38 - 126 U/L   Total Bilirubin  1.2 0.3 - 1.2 mg/dL   Bilirubin, Direct 0.1 0.0 - 0.2 mg/dL   Indirect Bilirubin 1.1 (H) 0.3 - 0.9 mg/dL  Lipase, blood  Result Value Ref Range   Lipase 26 11 - 51 U/L  Troponin I - Once  Result Value Ref Range   Troponin I <0.03 <0.03 ng/mL   Dg Chest 2 View Result Date: 08/22/2018 CLINICAL DATA:  Vertigo, nausea, vomiting EXAM: CHEST - 2 VIEW COMPARISON:  10/14/2011 chest radiograph. FINDINGS: Stable cardiomediastinal silhouette with mild cardiomegaly. No pneumothorax. No pleural effusion. Lungs appear clear, with no acute consolidative airspace disease and no pulmonary edema. IMPRESSION: Stable mild cardiomegaly without pulmonary edema. No active pulmonary disease. Electronically Signed   By: Ilona Sorrel M.D.   On: 08/22/2018 15:33   Ct Head Wo Contrast Result Date: 08/22/2018 CLINICAL DATA:  Vertigo EXAM: CT HEAD WITHOUT CONTRAST TECHNIQUE: Contiguous axial images were obtained from the base of the skull through the vertex without intravenous contrast. COMPARISON:  None. FINDINGS: Brain: No evidence of acute infarction, hemorrhage, hydrocephalus, extra-axial collection or mass lesion/mass effect. Vascular: No hyperdense vessel or unexpected calcification. Skull: Normal. Negative for fracture or focal lesion. Sinuses/Orbits: No acute finding. Other: None. IMPRESSION: No acute intracranial pathology. Electronically Signed   By: Eddie Candle M.D.   On: 08/22/2018 15:49     1720:  Multiple doses of medications given for nausea and vertigo without improvement. Pt continues unable to evan stand at bedside. Will re-dose IV phenergan. Pt will need admit for symptom control and MRI brain. Dx and testing d/w pt and family.  Questions answered.  Verb understanding, agreeable to admit. T/C returned from [facility] Dr. Velia Meyer, case discussed, including:  HPI, pertinent PM/SHx, VS/PE, dx testing, ED course and treatment:  Agreeable to  admit.        Final Clinical Impressions(s) / ED Diagnoses   Final diagnoses:  None    ED Discharge Orders    None       Francine Graven, DO 08/26/18 2218

## 2018-08-22 NOTE — ED Notes (Addendum)
Attempted to ambulate per dr. Thurnell Garbe.   Almost fell out of bed. assisted to stand straight and felt extremely dizzy. Did not attempt to leave room. Could not stand straight. Kept on swaying.

## 2018-08-23 ENCOUNTER — Observation Stay (HOSPITAL_COMMUNITY): Payer: 59

## 2018-08-23 DIAGNOSIS — E6609 Other obesity due to excess calories: Secondary | ICD-10-CM

## 2018-08-23 DIAGNOSIS — M5412 Radiculopathy, cervical region: Secondary | ICD-10-CM | POA: Diagnosis not present

## 2018-08-23 DIAGNOSIS — R112 Nausea with vomiting, unspecified: Secondary | ICD-10-CM

## 2018-08-23 DIAGNOSIS — I10 Essential (primary) hypertension: Secondary | ICD-10-CM | POA: Diagnosis not present

## 2018-08-23 DIAGNOSIS — Z6837 Body mass index (BMI) 37.0-37.9, adult: Secondary | ICD-10-CM

## 2018-08-23 DIAGNOSIS — R42 Dizziness and giddiness: Secondary | ICD-10-CM | POA: Diagnosis not present

## 2018-08-23 DIAGNOSIS — E7800 Pure hypercholesterolemia, unspecified: Secondary | ICD-10-CM

## 2018-08-23 DIAGNOSIS — E119 Type 2 diabetes mellitus without complications: Secondary | ICD-10-CM | POA: Diagnosis not present

## 2018-08-23 LAB — CBC
HCT: 44.7 % (ref 39.0–52.0)
Hemoglobin: 13.5 g/dL (ref 13.0–17.0)
MCH: 25.6 pg — ABNORMAL LOW (ref 26.0–34.0)
MCHC: 30.2 g/dL (ref 30.0–36.0)
MCV: 84.8 fL (ref 80.0–100.0)
Platelets: 252 10*3/uL (ref 150–400)
RBC: 5.27 MIL/uL (ref 4.22–5.81)
RDW: 15.6 % — ABNORMAL HIGH (ref 11.5–15.5)
WBC: 7.7 10*3/uL (ref 4.0–10.5)
nRBC: 0 % (ref 0.0–0.2)

## 2018-08-23 LAB — BASIC METABOLIC PANEL
Anion gap: 5 (ref 5–15)
BUN: 14 mg/dL (ref 8–23)
CO2: 27 mmol/L (ref 22–32)
Calcium: 8.4 mg/dL — ABNORMAL LOW (ref 8.9–10.3)
Chloride: 108 mmol/L (ref 98–111)
Creatinine, Ser: 0.65 mg/dL (ref 0.61–1.24)
GFR calc Af Amer: >60 mL/min (ref >60)
Glucose, Bld: 112 mg/dL — ABNORMAL HIGH (ref 70–99)
Potassium: 3.6 mmol/L (ref 3.5–5.1)
Sodium: 140 mmol/L (ref 135–145)

## 2018-08-23 LAB — GLUCOSE, CAPILLARY
Glucose-Capillary: 121 mg/dL — ABNORMAL HIGH (ref 70–99)
Glucose-Capillary: 132 mg/dL — ABNORMAL HIGH (ref 70–99)
Glucose-Capillary: 128 mg/dL — ABNORMAL HIGH (ref 70–99)

## 2018-08-23 LAB — MAGNESIUM: MAGNESIUM: 2.4 mg/dL (ref 1.7–2.4)

## 2018-08-23 MED ORDER — MECLIZINE HCL 25 MG PO TABS: 25.0000 mg | ORAL_TABLET | Freq: Three times a day (TID) | ORAL | 1 refills | Status: DC

## 2018-08-23 MED ORDER — ONDANSETRON 8 MG PO TBDP: 8.0000 mg | ORAL_TABLET | Freq: Three times a day (TID) | ORAL | 0 refills | Status: DC | PRN

## 2018-08-23 MED ORDER — GADOBUTROL 1 MMOL/ML IV SOLN
10.0000 mL | Freq: Once | INTRAVENOUS | Status: AC | PRN
  Administered 2018-08-23: 10 mL via INTRAVENOUS

## 2018-08-23 MED ORDER — GABAPENTIN 300 MG PO CAPS: 300.0000 mg | ORAL_CAPSULE | Freq: Three times a day (TID) | ORAL | Status: DC

## 2018-08-23 NOTE — Evaluation (Signed)
Physical Therapy Evaluation Patient Details Name: Adam Sutton MRN: [id number] DOB: 04/18/1956 Today's Date: 08/23/2018   History of Present Illness  Adam Sutton is a 63 y.o. male with medical history significant for benign paroxysmal positional vertigo, hypertension, type 2 diabetes mellitus, prostate cancer status post radical prostatectomy in 2013, who is admitted to [facility] on 08/22/2018 with vertigo after presenting from home to [facility] ED complaining of the sensation of "room spinning".     Clinical Impression  Patient presents with left torsional up beating nystagmus upon sitting up at bedside with near loss of balance, nystagmus that subsided but did not stop throughout visit, put patient in Hall-pike dix maneuver to the right and left with symptom decreasing when head turned to the right, but c/o increased numbness and radiating pain to right fingers 2-5, numbness/pain slightly relieved when head turned to the left, but nystagmus increased.  Patient also c/o stiffness/pain in neck with movement especially in extension.  Patient instructed in ROM exercises for neck and informed that dizziness/vertigo could possibly be cervicogenic.  Patient demonstrates unsteady gait with wide base of support having to reach for nearby objects for support when attempting gait without an AD, required use of RW for safety and informed to ask for assistance when needing to get out of bed - RN notified.  Patient will benefit from continued physical therapy in hospital and recommended venue below to increase strength, balance, endurance for safe ADLs and gait.    Follow Up Recommendations Home health PT;Supervision for mobility/OOB;Supervision - Intermittent    Equipment Recommendations  Rolling walker with 5" wheels    Recommendations for Other Services       Precautions / Restrictions Precautions Precautions: Fall Restrictions Weight Bearing Restrictions: No      Mobility  Bed  Mobility Overal bed mobility: Needs Assistance Bed Mobility: Supine to Sit;Sit to Supine     Supine to sit: Min guard;Min assist Sit to supine: Supervision   General bed mobility comments: slow labored movement with near loss of balance due to vertigo  Transfers Overall transfer level: Needs assistance Equipment used: Rolling walker (2 wheeled);None Transfers: Sit to/from [employer] to Stand: Min assist Stand pivot transfers: Min assist       General transfer comment: very unsteady with wide base of support having to lean on nearby objects for support, required use of RW for safety  Ambulation/Gait Ambulation/Gait assistance: Min assist Gait Distance (Feet): 80 Feet Assistive device: Rolling walker (2 wheeled) Gait Pattern/deviations: Decreased step length - right;Decreased step length - left;Decreased dorsiflexion - right;Wide base of support Gait velocity: decreased   General Gait Details: unsteady with wide base of support having to reach for nearby objects for support, improvement using RW with slow slightly labored cadence no loss of balance  Stairs            Wheelchair Mobility    Modified Rankin (Stroke Patients Only)       Balance Overall balance assessment: Needs assistance Sitting-balance support: Feet supported;No upper extremity supported Sitting balance-Leahy Scale: Fair Sitting balance - Comments: decreased balance mostly due to vertigo (room spinning sensation)   Standing balance support: During functional activity;No upper extremity supported Standing balance-Leahy Scale: Poor Standing balance comment: fair using RW                             Pertinent Vitals/Pain Pain Assessment: 0-10 Pain Score: 6  Pain Location: possible  pinched nerve pain from neck radiating down RUE to fingers, not much in thumb Pain Descriptors / Indicators: Numbness;Discomfort;Sharp Pain Intervention(s): Limited activity within  patient's tolerance;Monitored during session    Home Living Family/patient expects to be discharged to:: Private residence Living Arrangements: Spouse/significant other   Type of Home: House Home Access: Stairs to enter Entrance Stairs-Rails: Right Entrance Stairs-Number of Steps: 13 (enters home through basement) Home Layout: Laundry or work area in [employer]: None      Prior Function Level of Independence: Independent               Journalist, newspaper        Extremity/Trunk Assessment   Upper Extremity Assessment Upper Extremity Assessment: Overall WFL for tasks assessed    Lower Extremity Assessment Lower Extremity Assessment: Overall WFL for tasks assessed    Cervical / Trunk Assessment Cervical / Trunk Assessment: Normal  Communication   Communication: No difficulties  Cognition Arousal/Alertness: Awake/alert Behavior During Therapy: WFL for tasks assessed/performed Overall Cognitive Status: Within Functional Limits for tasks assessed                                        General Comments      Exercises     Assessment/Plan    PT Assessment Patient needs continued PT services  PT Problem List Decreased strength;Decreased activity tolerance;Decreased balance;Decreased mobility       PT Treatment Interventions Gait training;Stair training;Functional mobility training;Therapeutic activities;Patient/family education;Therapeutic exercise;Balance training;Other (comment)(vestibular exercises)    PT Goals (Current goals can be found in the Care Plan section)  Acute Rehab PT Goals Patient Stated Goal: return home after vertigo resolved PT Goal Formulation: With patient/family Time For Goal Achievement: 08/30/18 Potential to Achieve Goals: Fair    Frequency Min 5X/week   Barriers to discharge        Co-evaluation               AM-PAC PT "6 Clicks" Mobility  Outcome Measure Help needed turning from your back to  your side while in a flat bed without using bedrails?: None Help needed moving from lying on your back to sitting on the side of a flat bed without using bedrails?: A Little Help needed moving to and from a bed to a chair (including a wheelchair)?: A Little Help needed standing up from a chair using your arms (e.g., wheelchair or bedside chair)?: A Little Help needed to walk in hospital room?: A Little Help needed climbing 3-5 steps with a railing? : A Little 6 Click Score: 19    End of Session Equipment Utilized During Treatment: Gait belt Activity Tolerance: Patient tolerated treatment well;Patient limited by fatigue Patient left: in bed;with call bell/phone within reach Nurse Communication: Mobility status PT Visit Diagnosis: Unsteadiness on feet (R26.81);Other abnormalities of gait and mobility (R26.89);Muscle weakness (generalized) (M62.81);Dizziness and giddiness (R42)    Time: 7893-8101 PT Time Calculation (min) (ACUTE ONLY): 45 min   Charges:   PT Evaluation $PT Eval High Complexity: 1 High PT Treatments $Gait Training: 8-22 mins $Therapeutic Activity: 23-37 mins        3:54 PM, 08/23/18 Lonell Grandchild, MPT Physical Therapist with [facility] [phone] office [phone] mobile phone

## 2018-08-23 NOTE — Care Management Note (Signed)
Observation for vertigo. From home with wife. Pt has insurance PCP. PT recommends HH and RW. Pt agreeable. CMS provider options reviewed and pt chooses [facility]. Pt aware HH has 48 hrs to make first visit. Vaughan Basta, [facility], rep given referral and will pull pt info from chart. Juliann Pulse, [facility] rep given RW referral and will deliver to pt room prior to DC.

## 2018-08-23 NOTE — Progress Notes (Signed)
Removed-clean, dry, intact. Reviewed d/c paperwork with patient and wife. Reviewed new medications and medication changes. Reviewed with patient that he has picked a neurosurgeon in [place]. Wheeled stable patient and belongings to short stay entrance where his wife picked him up to d/c to home.

## 2018-08-23 NOTE — Discharge Summary (Signed)
Physician Discharge Summary  Adam Sutton WVP:[id number] DOB: October 09, 1955 DOA: 08/22/2018  PCP: Claretta Fraise, MD  Admit date: 08/22/2018 Discharge date: 08/23/2018  Time spent: 30 minutes  Recommendations for Outpatient Follow-up:  1. Reassess for vertigo symptoms resolution. 2. Follow CBG's and adjust hypoglycemic regimen as needed 3. Outpatient follow up with neurosurgery    Discharge Diagnoses:  Principal Problem:   Vertigo Active Problems:   Hypertension   Diabetes mellitus without complication (HCC)   Pure hypercholesterolemia   Cervical radiculopathy   Nausea and vomiting in adult patient   Class 2 obesity due to excess calories without serious comorbidity with body mass index (BMI) of 37.0 to 37.9 in adult   Discharge Condition: Stable and improved.  Patient discharged home with instruction to follow-up with PCP in 10 days and to follow-up with neurosurgery as an outpatient.  Diet recommendation: Low calorie diet, modified carbohydrates and heart healthy diet.  Filed Weights   08/22/18 1329 08/22/18 2042 08/23/18 0500  Weight: 111.1 kg 112.9 kg 112.9 kg    History of present illness:  63 y.o. male with medical history significant for benign paroxysmal positional vertigo, hypertension, type 2 diabetes mellitus, prostate cancer status post radical prostatectomy in 2013, who is admitted to [facility] on 08/22/2018 with vertigo after presenting from home to [facility] ED complaining of the sensation of "room spinning".   The patient reports that he was originally diagnosed with benign paroxysmal positional vertigo approximately 1 year ago following episode of vertigo associated with standing as well as head rotation and associated with nausea.  At that time he was prescribed as needed meclizine.  Subsequent to initial diagnosis, the patient reports that he experienced 5 similar episodes of vertigo leading up to today, each lasting only a few minutes and improving with  PRN use of aforementioned meclizine.   Around 4 AM this morning, the patient reports that he awoke needing to urinate, which she reports was not out of the ordinary for him at that time of the morning.  However, upon standing up at the side of bed, he reports that he developed sudden onset of the sensation that the "room was spinning", and noted associated nausea preventing him from taking his typical prn Meclizine in the setting of these symptoms.  He believes that the room-spinning sensation has been constant since onset, and reports exacerbation of vertigo and nausea with subsequent attempts to rise from a standing position as well as with rotation of the head.  The patient denies any associated acute focal weakness, numbness, paresthesias, change in vision, dysphagia, dysarthria, facial droop.  He also denies any associated chest pain, shortness of breath, palpitations, presyncope, or syncope.  No recent change in hearing, and also denies any recent trauma.  Denies any preceding rhinitis, rhinorrhea, sore throat, or cough.  Denies any otalgia. In terms of medications that can be associated with vertigo, the patient reports that he does not take aspirin as an outpatient, nor has he been on any antibiotics over the last year.  As additional work-up for his year-long history of recurrent episodes of vertigo, the patient reports that he has not previously undergone MRI of the brain.  ED Course: Vital signs in the ED were notable for the following: Temperature max 98.7; heart rate 64-69; blood pressure ranged from 154/96-150 8/91; respiratory rate 15-18, and oxygen saturation 98 to 100% on room air.  Labs performed in the ED were notable for the following: CMP notable for sodium 138, bicarbonate 28,  creatinine 0.78, and glucose 151.  Troponin IX x1-.  CBC notable for white blood cell count of 7500.  Urinalysis showed no white blood cells, and was also noted to be nitrite negative as well as negative for  leukocyte esterase.  Noncontrast CT of the head, per final radiology report showed no evidence of acute intracranial process, including no evidence of ischemia or intracranial hemorrhage.  While still in the ED, the following were administered: Zofran 4 mg IV x1, Phenergan 12.5 mg IV x1, Valium 5 mg p.o. x1, and meclizine 25 mg p.o. x1.  Additionally, the patient received a 1 L IV normal saline bolus.  Subsequently, in the setting of ongoing vertigo, associated nausea, and inability to take p.o., the patient was admitted for overnight observation for symptomatic management of presenting vertigo as well as for additional evaluation via MRI brain.  Hospital Course:  1-Benign paroxysmal positional Vertigo -MRI negative for stroke -discharge on meclizine TID -outpatient services for further vestibular training -PRN ODT zofran prescription given. -symptoms significant   2-essential hypertension -Continue home antihypertensive regimen -Heart healthy diet discussed with patient.  3-radicular cervicalgia -Continue Neurontin -Outpatient follow-up with neurosurgery.  4-hyperlipidemia  -continue statins.  5-type 2 diabetes -Resume home oral hypoglycemic agents. -Patient advised to follow modified carbohydrate diet.  6-class II obesity -Body mass index is 37.85 kg/m. -Low calorie diet, portion control and increase physical activity discussed with patient.  7-nausea/vomiting -Associated with problem #1 -Continue as needed antiemetics.   Procedures:  See below for x-ray reports   Consultations:  Discussed MRI changes with Dr. Saintclair Halsted (neurosurgery); recommendations given for outpatient follow up.  Discharge Exam: Vitals:   08/22/18 2036 08/23/18 0531  BP: (!) 146/85 (!) 135/54  Pulse: 73 67  Resp:    Temp: 98.3 F (36.8 C) 98.1 F (36.7 C)  SpO2: 98% 95%    General: Afebrile, no further episode of vomiting.  Significant improvement in his overall dizziness sensation.  There  is no focal weakness on exam.  Positive lateral nystagmus (which is significantly improved after vestibular maneuvers by physical therapy service). Cardiovascular: S1 and S2, no rubs, no gallops, no murmurs. Respiratory: Clear to auscultation bilaterally Abdomen: Soft, nontender, nondistended, positive bowel sounds Extremities: Right upper extremity tingling/numbness sensations reported; full range of motion, no cyanosis, no clubbing, no lower extremity edema.  Discharge Instructions   Discharge Instructions    Diet - low sodium heart healthy   Complete by:  As directed    Discharge instructions   Complete by:  As directed    Take medications as prescribed Keep yourself well-hydrated Follow-up with neurosurgery as discussed Follow-up with PCP in 10 days. Follow heart healthy diet.     Allergies as of 08/23/2018      Reactions   Tape Other (See Comments)   blisters   Sulfamethoxazole-trimethoprim Rash      Medication List    TAKE these medications   gabapentin 300 MG capsule Commonly known as:  NEURONTIN Take 1 capsule (300 mg total) by mouth 3 (three) times daily. What changed:  when to take this   glimepiride 2 MG tablet Commonly known as:  AMARYL Take 1 tablet (2 mg total) by mouth daily with breakfast.   lisinopril-hydrochlorothiazide 20-25 MG tablet Commonly known as:  PRINZIDE,ZESTORETIC Take 1 tablet by mouth daily before breakfast.   meclizine 25 MG tablet Commonly known as:  ANTIVERT Take 1 tablet (25 mg total) by mouth 3 (three) times daily. What changed:    when to  take this  reasons to take this   metFORMIN 500 MG tablet Commonly known as:  GLUCOPHAGE Take 1 tablet (500 mg total) by mouth 2 (two) times daily with a meal.   metoprolol tartrate 100 MG tablet Commonly known as:  LOPRESSOR Take 1 tablet (100 mg total) by mouth 2 (two) times daily.   ondansetron 8 MG disintegrating tablet Commonly known as:  Zofran ODT Take 1 tablet (8 mg total) by  mouth every 8 (eight) hours as needed for nausea or vomiting.   OneTouch Verio test strip Generic drug:  glucose blood Check glucose twice daily   pravastatin 40 MG tablet Commonly known as:  PRAVACHOL Take 1 tablet (40 mg total) by mouth at bedtime.            Durable Medical Equipment  (From admission, onward)         Start     Ordered   08/23/18 1609  For home use only DME Walker rolling  Once    Question:  Patient needs a walker to treat with the following condition  Answer:  Vertigo   08/23/18 1608         Allergies  Allergen Reactions  . Tape Other (See Comments)    blisters  . Sulfamethoxazole-Trimethoprim Rash   Follow-up Information    Claretta Fraise, MD. Schedule an appointment as soon as possible for a visit in 10 day(s).   Specialty:  Family Medicine Contact information: [address] [place] [address] [phone]           The results of significant diagnostics from this hospitalization (including imaging, microbiology, ancillary and laboratory) are listed below for reference.    Significant Diagnostic Studies: Dg Chest 2 View  Result Date: 08/22/2018 CLINICAL DATA:  Vertigo, nausea, vomiting EXAM: CHEST - 2 VIEW COMPARISON:  10/14/2011 chest radiograph. FINDINGS: Stable cardiomediastinal silhouette with mild cardiomegaly. No pneumothorax. No pleural effusion. Lungs appear clear, with no acute consolidative airspace disease and no pulmonary edema. IMPRESSION: Stable mild cardiomegaly without pulmonary edema. No active pulmonary disease. Electronically Signed   By: Ilona Sorrel M.D.   On: 08/22/2018 15:33   Ct Head Wo Contrast  Result Date: 08/22/2018 CLINICAL DATA:  Vertigo EXAM: CT HEAD WITHOUT CONTRAST TECHNIQUE: Contiguous axial images were obtained from the base of the skull through the vertex without intravenous contrast. COMPARISON:  None. FINDINGS: Brain: No evidence of acute infarction, hemorrhage, hydrocephalus, extra-axial collection or  mass lesion/mass effect. Vascular: No hyperdense vessel or unexpected calcification. Skull: Normal. Negative for fracture or focal lesion. Sinuses/Orbits: No acute finding. Other: None. IMPRESSION: No acute intracranial pathology. Electronically Signed   By: Eddie Candle M.D.   On: 08/22/2018 15:49   Mr Jeri Cos DE Contrast  Result Date: 08/23/2018 CLINICAL DATA:  Recurrent vertigo.  Prostate cancer EXAM: MRI HEAD WITHOUT AND WITH CONTRAST TECHNIQUE: Multiplanar, multiecho pulse sequences of the brain and surrounding structures were obtained without and with intravenous contrast. CONTRAST:  10 mL Gadovist IV COMPARISON:  CT head 08/22/2018 FINDINGS: Brain: Ventricle size and cerebral volume normal. Negative for acute infarct. Minimal chronic changes in the periventricular white matter most likely chronic microvascular ischemia. Negative for hemorrhage or mass. Postcontrast imaging degraded by motion.  Normal enhancement. Vascular: Normal arterial flow voids Skull and upper cervical spine: Negative Sinuses/Orbits: Mild mucosal edema paranasal sinuses.  Normal orbit Other: None IMPRESSION: No acute abnormality. Very mild chronic white matter changes likely due to microvascular ischemia. Motion degraded examination. Electronically Signed  By: Franchot Gallo M.D.   On: 08/23/2018 10:16   Mr Cervical Spine Wo Contrast  Result Date: 08/06/2018 CLINICAL DATA:  Cervical disc degeneration. Right arm numbness. Limited range of motion of the neck. History of prostate cancer. EXAM: MRI CERVICAL SPINE WITHOUT CONTRAST TECHNIQUE: Multiplanar, multisequence MR imaging of the cervical spine was performed. No intravenous contrast was administered. COMPARISON:  Radiographs dated 06/17/2018 FINDINGS: Alignment: Straightening of the normal cervical lordosis. Vertebrae: No fracture, evidence of discitis, or bone lesion. Cord: There is slight compression of the cervical spinal cord centrally at C5-6 without definitive myelopathy.  Spinal cord is slightly compressed at C4-5 without myelopathy. Posterior Fossa, vertebral arteries, paraspinal tissues: There are multiple nodules in the thyroid gland with the largest being approximately 13 mm on the right. If this has not been previously assessed, thyroid ultrasound recommended for further characterization. No other significant abnormalities. Disc levels: C2-3: Normal disc. Moderate right facet arthritis with slight right foraminal stenosis. C3-4: Broad-based disc osteophyte complex extending into both neural foramina, more prominent on the right than the left with slight left and moderately severe right foraminal stenosis. C4-5: Broad-based disc osteophyte complex asymmetric to the right with slight compression of the ventral aspect of the right side of the spinal cord without myelopathy. Encroachment upon both lateral recesses with slight bilateral foraminal stenosis. C5-6: Broad-based disc osteophyte complex most prominent centrally with central indentation upon the cervical spinal cord without definitive myelopathy. Encroachment upon both lateral recesses and severe bilateral foraminal stenosis. C6-7: Prominent uncinate spurs to the right and left of midline with moderately severe right and severe left foraminal stenosis with encroachment upon both lateral recesses, left worse than right. C7-T1: Broad-based disc osteophyte complex with moderately severe right and minimal left foraminal stenosis. IMPRESSION: 1. Severe bilateral foraminal stenoses at C5-6 with central compression of the spinal cord at C5-6. 2. Severe left and moderately severe right foraminal stenoses at C6-7 with bilateral lateral recess encroachment. 3. Moderately severe right foraminal stenosis at C7-T1. 4. Right lateral recess impingement at C4-5 which could affect the right C5 nerve. Electronically Signed   By: Lorriane Shire M.D.   On: 08/06/2018 11:34    Microbiology: No results found for this or any previous visit  (from the past 240 hour(s)).   Labs: Basic Metabolic Panel: Recent Labs  Lab 08/22/18 1351 08/23/18 0554  NA 138 140  K 4.1 3.6  CL 101 108  CO2 28 27  GLUCOSE 151* 112*  BUN 17 14  CREATININE 0.78 0.65  CALCIUM 8.8* 8.4*  MG 2.1 2.4   Liver Function Tests: Recent Labs  Lab 08/22/18 1351  AST 25  ALT 20  ALKPHOS 84  BILITOT 1.2  PROT 7.9  ALBUMIN 4.4   Recent Labs  Lab 08/22/18 1351  LIPASE 26   CBC: Recent Labs  Lab 08/22/18 1351 08/23/18 0554  WBC 7.5 7.7  HGB 14.9 13.5  HCT 48.3 44.7  MCV 83.3 84.8  PLT 265 252   Cardiac Enzymes: Recent Labs  Lab 08/22/18 1351  TROPONINI <0.03   CBG: Recent Labs  Lab 08/23/18 0734 08/23/18 1127 08/23/18 1607  GLUCAP 121* 132* 128*    Signed:  Barton Dubois MD.  [facility] 08/23/2018, 4:18 PM

## 2018-08-23 NOTE — Plan of Care (Signed)
  Problem: Acute Rehab PT Goals(only PT should resolve) Goal: Pt Will Go Supine/Side To Sit Outcome: Progressing Flowsheets (Taken 08/23/2018 1556) Pt will go Supine/Side to Sit: with modified independence Goal: Patient Will Transfer Sit To/From Stand Outcome: Progressing Flowsheets (Taken 08/23/2018 1556) Patient will transfer sit to/from stand: with modified independence Goal: Pt Will Transfer Bed To Chair/Chair To Bed Outcome: Progressing Flowsheets (Taken 08/23/2018 1556) Pt will Transfer Bed to Chair/Chair to Bed: with modified independence Goal: Pt Will Ambulate Outcome: Progressing Flowsheets (Taken 08/23/2018 1556) Pt will Ambulate: > 125 feet; with modified independence; with rolling walker   3:57 PM, 08/23/18 Lonell Grandchild, MPT Physical Therapist with [facility] [phone] office [phone] mobile phone

## 2018-08-24 DIAGNOSIS — R42 Dizziness and giddiness: Secondary | ICD-10-CM | POA: Diagnosis not present

## 2018-08-24 LAB — HIV ANTIBODY (ROUTINE TESTING W REFLEX): HIV Screen 4th Generation wRfx: NONREACTIVE

## 2018-09-08 ENCOUNTER — Telehealth: Payer: Self-pay

## 2018-09-08 ENCOUNTER — Other Ambulatory Visit: Payer: Self-pay | Admitting: Family Medicine

## 2018-09-08 DIAGNOSIS — R42 Dizziness and giddiness: Secondary | ICD-10-CM

## 2018-09-08 NOTE — Telephone Encounter (Signed)
Wants a referral to ENT for Vertigo ASAP

## 2018-09-15 ENCOUNTER — Other Ambulatory Visit: Payer: Self-pay

## 2018-09-15 DIAGNOSIS — R42 Dizziness and giddiness: Secondary | ICD-10-CM

## 2018-10-04 ENCOUNTER — Ambulatory Visit: Payer: 59 | Admitting: Family Medicine

## 2018-10-14 ENCOUNTER — Telehealth: Payer: Self-pay | Admitting: Family Medicine

## 2018-11-18 DIAGNOSIS — H8120 Vestibular neuronitis, unspecified ear: Secondary | ICD-10-CM | POA: Insufficient documentation

## 2019-03-17 ENCOUNTER — Encounter: Payer: Self-pay | Admitting: Family Medicine

## 2019-03-17 ENCOUNTER — Ambulatory Visit (INDEPENDENT_AMBULATORY_CARE_PROVIDER_SITE_OTHER): Payer: Medicare Other | Admitting: Family Medicine

## 2019-03-17 DIAGNOSIS — M5412 Radiculopathy, cervical region: Secondary | ICD-10-CM | POA: Diagnosis not present

## 2019-03-17 MED ORDER — PREDNISONE 10 MG (21) PO TBPK: ORAL_TABLET | ORAL | 0 refills | Status: DC

## 2019-03-17 NOTE — Progress Notes (Signed)
Virtual Visit via Telephone Note  I connected with Adam Sutton on 03/17/19 at 12:51 PM by telephone and verified that I am speaking with the correct person using two identifiers. Adam Sutton is currently located at work and nobody is currently with him during this visit. The provider, Loman Brooklyn, FNP is located in their home at time of visit.  I discussed the limitations, risks, security and privacy concerns of performing an evaluation and management service by telephone and the availability of in person appointments. I also discussed with the patient that there may be a patient responsible charge related to this service. The patient expressed understanding and agreed to proceed.  Subjective: PCP: Claretta Fraise, MD  Chief Complaint  Patient presents with  . Pain   Patient reports nerve pain affecting his right arm down to his finger tips x3 days. Patient reports he knows he needs surgery as he has already been to [facility] but he is afraid due to his past prostate surgery that went bad. He did yard work over the weekend which has exacerbated his known cervical radiculopathy. He has been taking the gabapentin, Tylenol, and Aleve which have not been helpful over the past three days. He was previously doing fine.   ROS: Per HPI  Current Outpatient Medications:  .  gabapentin (NEURONTIN) 300 MG capsule, Take 1 capsule (300 mg total) by mouth 3 (three) times daily., Disp: , Rfl:  .  glimepiride (AMARYL) 2 MG tablet, Take 1 tablet (2 mg total) by mouth daily with breakfast., Disp: 90 tablet, Rfl: 1 .  lisinopril-hydrochlorothiazide (PRINZIDE,ZESTORETIC) 20-25 MG tablet, Take 1 tablet by mouth daily before breakfast., Disp: 90 tablet, Rfl: 1 .  meclizine (ANTIVERT) 25 MG tablet, Take 1 tablet (25 mg total) by mouth 3 (three) times daily., Disp: 90 tablet, Rfl: 1 .  metFORMIN (GLUCOPHAGE) 500 MG tablet, Take 1 tablet (500 mg total) by mouth 2 (two) times daily  with a meal., Disp: 180 tablet, Rfl: 1 .  metoprolol tartrate (LOPRESSOR) 100 MG tablet, Take 1 tablet (100 mg total) by mouth 2 (two) times daily., Disp: 180 tablet, Rfl: 1 .  ondansetron (ZOFRAN ODT) 8 MG disintegrating tablet, Take 1 tablet (8 mg total) by mouth every 8 (eight) hours as needed for nausea or vomiting., Disp: 20 tablet, Rfl: 0 .  ONETOUCH VERIO test strip, Check glucose twice daily, Disp: 200 each, Rfl: 11 .  pravastatin (PRAVACHOL) 40 MG tablet, Take 1 tablet (40 mg total) by mouth at bedtime., Disp: 90 tablet, Rfl: 1  Allergies  Allergen Reactions  . Tape Other (See Comments)    blisters  . Sulfamethoxazole-Trimethoprim Rash   Past Medical History:  Diagnosis Date  . Cancer [facility])    prostate cancer  . Cervical disc disorder with radiculopathy    right arm numbness  . Diabetes mellitus without complication ([place])   . Hypertension   . Lipidemia   . Sleep apnea 10/14/11   STOP BANG SCORE 4  . Vertigo     Observations/Objective: A&O  No respiratory distress or wheezing audible over the phone Mood, judgement, and thought processes all WNL  Assessment and Plan: 1. Cervical radiculopathy - Encouraged patient to schedule follow-up appointment with [facility] to discuss surgery. Steroid taper sent today to help with current exacerbation of symptoms.  - predniSONE (STERAPRED UNI-PAK 21 TAB) 10 MG (21) TBPK tablet; Use as directed on back of pill pack  Dispense: 21 tablet; Refill: 0  Follow Up Instructions:  I discussed the assessment and treatment plan with the patient. The patient was provided an opportunity to ask questions and all were answered. The patient agreed with the plan and demonstrated an understanding of the instructions.   The patient was advised to call back or seek an in-person evaluation if the symptoms worsen or if the condition fails to improve as anticipated.  The above assessment and management plan was discussed with the  patient. The patient verbalized understanding of and has agreed to the management plan. Patient is aware to call the clinic if symptoms persist or worsen. Patient is aware when to return to the clinic for a follow-up visit. Patient educated on when it is appropriate to go to the emergency department.   Time call ended: 1:03 PM  I provided 15 minutes of non-face-to-face time during this encounter.  Hendricks Limes, MSN, APRN, FNP-C [facility] 03/17/19

## 2019-03-17 NOTE — Patient Instructions (Signed)

## 2019-03-24 ENCOUNTER — Other Ambulatory Visit: Payer: Self-pay | Admitting: Neurological Surgery

## 2019-04-04 ENCOUNTER — Ambulatory Visit: Payer: 59 | Admitting: Family Medicine

## 2019-04-08 NOTE — Progress Notes (Signed)
[place], [address] [place] [address] Phone: [phone] Fax: [phone]    Your procedure is scheduled on Wednesday, October 28th.  Report to [facility] Main Entrance "A" at 10:00 A.M., and check in at the Admitting office.  Call this number if you have problems the morning of surgery:  [phone]  Call [phone] if you have any questions prior to your surgery date Monday-Friday 8am-4pm   Remember:  Do not eat or drink after midnight the night before your surgery   Take these medicines the morning of surgery with A SIP OF WATER  metoprolol tartrate (LOPRESSOR)   If needed - oxyCODONE-acetaminophen (PERCOCET/ROXICET)   As of today, STOP taking any Aspirin (unless otherwise instructed by your surgeon), Aleve, Naproxen, Ibuprofen, Motrin, Advil, Goody's, BC's, all herbal medications, fish oil, and all vitamins.  How to Manage Your Diabetes Before and After Surgery  WHAT DO I DO ABOUT MY DIABETES MEDICATION?  Do not glimepiride (AMARYL) or metFORMIN (GLUCOPHAGE) take oral diabetes medicines (pills) the morning of surgery.   Why is it important to control my blood sugar before and after surgery? . Improving blood sugar levels before and after surgery helps healing and can limit problems. . A way of improving blood sugar control is eating a healthy diet by: o  Eating less sugar and carbohydrates o  Increasing activity/exercise o  Talking with your doctor about reaching your blood sugar goals . High blood sugars (greater than 180 mg/dL) can raise your risk of infections and slow your recovery, so you will need to focus on controlling your diabetes during the weeks before surgery. . Make sure that the doctor who takes care of your diabetes knows about your planned surgery including the date and location.  How do I manage my blood sugar before surgery? . Check your blood sugar at least 4 times a day, starting 2 days  before surgery, to make sure that the level is not too high or low. o Check your blood sugar the morning of your surgery when you wake up and every 2 hours until you get to the Short Stay unit. . If your blood sugar is less than 70 mg/dL, you will need to treat for low blood sugar: o Do not take insulin. o Treat a low blood sugar (less than 70 mg/dL) with  cup of clear juice (cranberry or apple), 4 glucose tablets, OR glucose gel. Recheck blood sugar in 15 minutes after treatment (to make sure it is greater than 70 mg/dL). If your blood sugar is not greater than 70 mg/dL on recheck, call [phone] o  for further instructions. . Report your blood sugar to the short stay nurse when you get to Short Stay.  . If you are admitted to the hospital after surgery: o Your blood sugar will be checked by the staff and you will probably be given insulin after surgery (instead of oral diabetes medicines) to make sure you have good blood sugar levels. o The goal for blood sugar control after surgery is 80-180 mg/dL.  Reviewed and Endorsed by [facility] Patient Education Committee, August 2015   The Morning of Surgery  Do not wear jewelry, make-up or nail polish.  Do not wear lotions, powders, colognes, or deodorant  Do not shave 48 hours prior to surgery.    Men may shave face and neck.  Do not bring valuables to the hospital.  [facility] is not responsible for  any belongings or valuables.  If you are a smoker, DO NOT Smoke 24 hours prior to surgery IF you wear a CPAP at night please bring your mask, tubing, and machine the morning of surgery   Remember that you must have someone to transport you home after your surgery, and remain with you for 24 hours if you are discharged the same day.  Contacts, glasses, hearing aids, dentures or bridgework may not be worn into surgery.   Leave your suitcase in the car.  After surgery it may be brought to your room.  For patients admitted to the hospital,  discharge time will be determined by your treatment team.  Patients discharged the day of surgery will not be allowed to drive home.    Special instructions:   [place]- Preparing For Surgery  Before surgery, you can play an important role. Because skin is not sterile, your skin needs to be as free of germs as possible. You can reduce the number of germs on your skin by washing with CHG (chlorahexidine gluconate) Soap before surgery.  CHG is an antiseptic cleaner which kills germs and bonds with the skin to continue killing germs even after washing.    Oral Hygiene is also important to reduce your risk of infection.  Remember - BRUSH YOUR TEETH THE MORNING OF SURGERY WITH YOUR REGULAR TOOTHPASTE  Please do not use if you have an allergy to CHG or antibacterial soaps. If your skin becomes reddened/irritated stop using the CHG.  Do not shave (including legs and underarms) for at least 48 hours prior to first CHG shower. It is OK to shave your face.  Please follow these instructions carefully.   1. Shower the NIGHT BEFORE SURGERY and the MORNING OF SURGERY with CHG Soap.   2. If you chose to wash your hair, wash your hair first as usual with your normal shampoo.  3. After you shampoo, rinse your hair and body thoroughly to remove the shampoo.  4. Use CHG as you would any other liquid soap. You can apply CHG directly to the skin and wash gently with a scrungie or a clean washcloth.   5. Apply the CHG Soap to your body ONLY FROM THE NECK DOWN.  Do not use on open wounds or open sores. Avoid contact with your eyes, ears, mouth and genitals (private parts). Wash Face and genitals (private parts)  with your normal soap.   6. Wash thoroughly, paying special attention to the area where your surgery will be performed.  7. Thoroughly rinse your body with warm water from the neck down.  8. DO NOT shower/wash with your normal soap after using and rinsing off the CHG Soap.  9. Pat yourself dry  with a CLEAN TOWEL.  10. Wear CLEAN PAJAMAS to bed the night before surgery, wear comfortable clothes the morning of surgery  11. Place CLEAN SHEETS on your bed the night of your first shower and DO NOT SLEEP WITH PETS.  Day of Surgery: Do not apply any deodorants/lotions. Please shower the morning of surgery with the CHG soap  Please wear clean clothes to the hospital/surgery center.   Remember to brush your teeth WITH YOUR REGULAR TOOTHPASTE.  Please read over the following fact sheets that you were given.

## 2019-04-11 ENCOUNTER — Other Ambulatory Visit (HOSPITAL_COMMUNITY)
Admission: RE | Admit: 2019-04-11 | Discharge: 2019-04-11 | Disposition: A | Discharge status: Home / Self Care | Payer: Medicare Other | Source: Ambulatory Visit | Attending: Neurological Surgery | Admitting: Neurological Surgery

## 2019-04-11 ENCOUNTER — Encounter (HOSPITAL_COMMUNITY): Payer: Self-pay

## 2019-04-11 ENCOUNTER — Other Ambulatory Visit: Payer: Self-pay

## 2019-04-11 ENCOUNTER — Ambulatory Visit (HOSPITAL_COMMUNITY)
Admission: RE | Admit: 2019-04-11 | Discharge: 2019-04-11 | Disposition: A | Discharge status: Home / Self Care | Payer: Medicare Other | Source: Ambulatory Visit | Attending: Neurological Surgery | Admitting: Neurological Surgery

## 2019-04-11 ENCOUNTER — Encounter (HOSPITAL_COMMUNITY)
Admission: RE | Admit: 2019-04-11 | Discharge: 2019-04-11 | Disposition: A | Discharge status: Home / Self Care | Payer: Medicare Other | Source: Ambulatory Visit | Attending: Neurological Surgery | Admitting: Neurological Surgery

## 2019-04-11 DIAGNOSIS — I10 Essential (primary) hypertension: Secondary | ICD-10-CM | POA: Diagnosis not present

## 2019-04-11 DIAGNOSIS — E119 Type 2 diabetes mellitus without complications: Secondary | ICD-10-CM | POA: Diagnosis not present

## 2019-04-11 DIAGNOSIS — Z20828 Contact with and (suspected) exposure to other viral communicable diseases: Secondary | ICD-10-CM | POA: Diagnosis not present

## 2019-04-11 DIAGNOSIS — Z79899 Other long term (current) drug therapy: Secondary | ICD-10-CM | POA: Diagnosis not present

## 2019-04-11 DIAGNOSIS — Z7984 Long term (current) use of oral hypoglycemic drugs: Secondary | ICD-10-CM | POA: Insufficient documentation

## 2019-04-11 DIAGNOSIS — M4802 Spinal stenosis, cervical region: Secondary | ICD-10-CM

## 2019-04-11 DIAGNOSIS — Z01818 Encounter for other preprocedural examination: Secondary | ICD-10-CM | POA: Diagnosis not present

## 2019-04-11 DIAGNOSIS — G4730 Sleep apnea, unspecified: Secondary | ICD-10-CM | POA: Insufficient documentation

## 2019-04-11 HISTORY — DX: Spinal stenosis, site unspecified: M48.00

## 2019-04-11 HISTORY — DX: Unspecified urinary incontinence: R32

## 2019-04-11 HISTORY — DX: Spinal stenosis, cervical region: M48.02

## 2019-04-11 LAB — CBC WITH DIFFERENTIAL/PLATELET
Abs Immature Granulocytes: 0.02 10*3/uL (ref 0.00–0.07)
Basophils Absolute: 0.1 10*3/uL (ref 0.0–0.1)
Basophils Relative: 1 %
Eosinophils Absolute: 0.1 10*3/uL (ref 0.0–0.5)
Eosinophils Relative: 2 %
HCT: 48.1 % (ref 39.0–52.0)
Hemoglobin: 15.7 g/dL (ref 13.0–17.0)
Immature Granulocytes: 0 %
Lymphocytes Relative: 26 %
Lymphs Abs: 1.9 10*3/uL (ref 0.7–4.0)
MCH: 27 pg (ref 26.0–34.0)
MCHC: 32.6 g/dL (ref 30.0–36.0)
MCV: 82.6 fL (ref 80.0–100.0)
Monocytes Absolute: 0.7 10*3/uL (ref 0.1–1.0)
Monocytes Relative: 10 %
Neutro Abs: 4.5 10*3/uL (ref 1.7–7.7)
Neutrophils Relative %: 61 %
Platelets: 261 10*3/uL (ref 150–400)
RBC: 5.82 MIL/uL — ABNORMAL HIGH (ref 4.22–5.81)
RDW: 14.5 % (ref 11.5–15.5)
WBC: 7.3 10*3/uL (ref 4.0–10.5)
nRBC: 0 % (ref 0.0–0.2)

## 2019-04-11 LAB — PROTIME-INR
INR: 1 (ref 0.8–1.2)
Prothrombin Time: 13.1 seconds (ref 11.4–15.2)

## 2019-04-11 LAB — SARS CORONAVIRUS 2 (TAT 6-24 HRS): SARS Coronavirus 2: NEGATIVE

## 2019-04-11 LAB — HEMOGLOBIN A1C
Hgb A1c MFr Bld: 6.7 % — ABNORMAL HIGH (ref 4.8–5.6)
Mean Plasma Glucose: 145.59 mg/dL

## 2019-04-11 LAB — BASIC METABOLIC PANEL
Anion gap: 10 (ref 5–15)
BUN: 21 mg/dL (ref 8–23)
CO2: 32 mmol/L (ref 22–32)
Calcium: 9.3 mg/dL (ref 8.9–10.3)
Chloride: 94 mmol/L — ABNORMAL LOW (ref 98–111)
Creatinine, Ser: 1.29 mg/dL — ABNORMAL HIGH (ref 0.61–1.24)
GFR calc Af Amer: >60 mL/min (ref >60)
GFR calc non Af Amer: 58 mL/min — ABNORMAL LOW (ref >60)
Glucose, Bld: 109 mg/dL — ABNORMAL HIGH (ref 70–99)
Potassium: 3 mmol/L — ABNORMAL LOW (ref 3.5–5.1)
Sodium: 136 mmol/L (ref 135–145)

## 2019-04-11 LAB — GLUCOSE, CAPILLARY: Glucose-Capillary: 104 mg/dL — ABNORMAL HIGH (ref 70–99)

## 2019-04-11 LAB — SURGICAL PCR SCREEN
MRSA, PCR: NEGATIVE
Staphylococcus aureus: NEGATIVE

## 2019-04-11 NOTE — Progress Notes (Signed)
Abnormal labs have resulted, message sent to Dr. Ronnald Ramp via Real.

## 2019-04-11 NOTE — Progress Notes (Signed)
   04/11/19 1020  OBSTRUCTIVE SLEEP APNEA  Have you ever been diagnosed with sleep apnea through a sleep study? No  Do you snore loudly (loud enough to be heard through closed doors)?  1  Do you often feel tired, fatigued, or sleepy during the daytime (such as falling asleep during driving or talking to someone)? 0  Has anyone observed you stop breathing during your sleep? 0  Do you have, or are you being treated for high blood pressure? 1  BMI more than 35 kg/m2? 1  Age > 50 (1-yes) 1  Neck circumference greater than:Male 16 inches or larger, Male 17inches or larger? 0  Male Gender (Yes=1) 1  Obstructive Sleep Apnea Score 5  Score 5 or greater  Results sent to PCP

## 2019-04-11 NOTE — Progress Notes (Signed)
PCP - Dr. Claretta Fraise Cardiologist - denies  Chest x-ray - 04/11/2019 EKG - 04/11/2019 Stress Test - denies ECHO - denies Cardiac Cath - denies  Sleep Study - denies CPAP- N/A STOP BANG score-  5, results routed to PCP  Fasting Blood Sugar - 100s - 150s Checks Blood Sugar 10 times a week (per patient does not check it on a day to day basis consistently)  Blood Thinner Instructions: N/A Aspirin Instructions: N/A  ERAS Protcol - No PRE-SURGERY Ensure or G2- N/a  COVID TEST- Scheduled for today after PAT appointment on 04/11/2019. Patient verbalized understanding of self-quarantine instructions and appointment time and place.  Anesthesia review: YES, abnormal EKG  Patient denies shortness of breath, fever, cough and chest pain at PAT appointment  All instructions explained to the patient, with a verbal understanding of the material. Patient agrees to go over the instructions while at home for a better understanding. Patient also instructed to self quarantine after being tested for COVID-19. The opportunity to ask questions was provided.

## 2019-04-12 NOTE — Progress Notes (Signed)
Anesthesia Chart Review:  Case: [id number] Date/Time: 04/13/19 1145   Procedure: ACDF - C5-C6 - C6-C7 (N/A )   Anesthesia type: General   Pre-op diagnosis: Stenosis   Location: [facility] OR ROOM 74 / [place] OR   Surgeon: Eustace Moore, MD      DISCUSSION: Patient is a 63 year old male scheduled for the above procedure.  History includes never smoker, HTN, DM2, hyperlipidemia, vertigo (admission 08/2018 for BPPV, MRI negative for CVA), prostate cancer (s/p robotic-assisted laparoscopic radical prostatectomy 10/22/11).  BMI is consistent with obesity. OSA screening score elevated at 5.   A1c 6.7%. EKG showed nonspecific intraventricular conduction block, which appears stable since at least 2013. He denied shortness of breath, cough, fever, chest pain at PAT RN visit. 04/11/2019 COVID-19 test negative.  Based on currently available information I would anticipate he could proceed as planned if no acute changes.   VS: BP 140/90   Pulse (!) 59   Temp (!) 36.4 C   Resp 18   Ht 5\' 8"  (1.727 m)   Wt 108.3 kg   SpO2 98%   BMI 36.31 kg/m   PROVIDERS: Claretta Fraise, MD his PCP   LABS: Labs reviewed: Acceptable for surgery. (all labs ordered are listed, but only abnormal results are displayed)  Labs Reviewed  GLUCOSE, CAPILLARY - Abnormal; Notable for the following components:      Result Value   Glucose-Capillary 104 (*)    All other components within normal limits  BASIC METABOLIC PANEL - Abnormal; Notable for the following components:   Potassium 3.0 (*)    Chloride 94 (*)    Glucose, Bld 109 (*)    Creatinine, Ser 1.29 (*)    GFR calc non Af Amer 58 (*)    All other components within normal limits  CBC WITH DIFFERENTIAL/PLATELET - Abnormal; Notable for the following components:   RBC 5.82 (*)    All other components within normal limits  HEMOGLOBIN A1C - Abnormal; Notable for the following components:   Hgb A1c MFr Bld 6.7 (*)    All other components within normal limits  SURGICAL PCR  SCREEN  PROTIME-INR     IMAGES: CXR 04/11/19: FINDINGS: Stable cardiomediastinal contours with mildly enlarged heart size. The lungs are clear. No pneumothorax or pleural effusion. No acute finding in the visualized skeleton. IMPRESSION: No acute cardiopulmonary finding.  MRI C-spine 08/06/18: IMPRESSION: 1. Severe bilateral foraminal stenoses at C5-6 with central compression of the spinal cord at C5-6. 2. Severe left and moderately severe right foraminal stenoses at C6-7 with bilateral lateral recess encroachment. 3. Moderately severe right foraminal stenosis at C7-T1. 4. Right lateral recess impingement at C4-5 which could affect the right C5 nerve.   EKG: 04/11/19: Normal sinus rhythm Non-specific intra-ventricular conduction block Minimal voltage criteria for LVH, may be normal variant ( Cornell product ) Nonspecific T wave abnormality Abnormal ECG Confirmed by Charolette Forward ([id number]) on 04/11/2019 3:29:40 PM - Overall, EKG appears stable when compared to tracings dating back to 10/14/11.   CV: N/A  Past Medical History:  Diagnosis Date  . Cancer [facility])    prostate cancer  . Cervical disc disorder with radiculopathy    right arm numbness  . Cervical stenosis of spine   . Diabetes mellitus without complication ([place])   . Hypertension   . Incontinence   . Lipidemia   . Nerve pain due to spinal stenosis   . Sleep apnea 10/14/11   STOP BANG SCORE 4  . Vertigo  Past Surgical History:  Procedure Laterality Date  . bilateral jaw surgery     40+ yrs ago- for TMJ PROBLEMS AND WISDOM TEETH IMPACTIONS  . PROSTATE SURGERY    . ROBOT ASSISTED LAPAROSCOPIC RADICAL PROSTATECTOMY  10/22/2011   Procedure: ROBOTIC ASSISTED LAPAROSCOPIC RADICAL PROSTATECTOMY;  Surgeon: Molli Hazard, MD;  Location: [facility] ORS;  Service: Urology;  Laterality: N/A;     . TONSILLECTOMY    . UMBILICAL HERNIA REPAIR    . VASECTOMY      MEDICATIONS: . glimepiride (AMARYL) 2 MG tablet  .  lisinopril-hydrochlorothiazide (PRINZIDE,ZESTORETIC) 20-25 MG tablet  . meclizine (ANTIVERT) 25 MG tablet  . metFORMIN (GLUCOPHAGE) 500 MG tablet  . metoprolol tartrate (LOPRESSOR) 100 MG tablet  . ONETOUCH VERIO test strip  . oxyCODONE-acetaminophen (PERCOCET/ROXICET) 5-325 MG tablet  . pravastatin (PRAVACHOL) 40 MG tablet  . predniSONE (STERAPRED UNI-PAK 21 TAB) 10 MG (21) TBPK tablet   No current facility-administered medications for this encounter.     Myra Gianotti, PA-C Surgical Short Stay/Anesthesiology [facility] Phone [phone] [facility] Phone [phone] 04/12/2019 9:48 AM

## 2019-04-12 NOTE — Anesthesia Preprocedure Evaluation (Addendum)
Anesthesia Evaluation  Patient identified by MRN, date of birth, ID band Patient awake    Reviewed: Allergy & Precautions, NPO status , Patient's Chart, lab work & pertinent test results  Airway Mallampati: II  TM Distance: >3 FB     Dental   Pulmonary    breath sounds clear to auscultation       Cardiovascular hypertension,  Rhythm:Regular Rate:Normal     Neuro/Psych    GI/Hepatic negative GI ROS, Neg liver ROS,   Endo/Other  diabetes  Renal/GU      Musculoskeletal   Abdominal   Peds  Hematology   Anesthesia Other Findings   Reproductive/Obstetrics                            Anesthesia Physical Anesthesia Plan  ASA: III  Anesthesia Plan: General   Post-op Pain Management:    Induction: Intravenous  PONV Risk Score and Plan: 2 and Ondansetron, Dexamethasone and Midazolam  Airway Management Planned: Oral ETT  Additional Equipment:   Intra-op Plan:   Post-operative Plan: Possible Post-op intubation/ventilation  Informed Consent: I have reviewed the patients History and Physical, chart, labs and discussed the procedure including the risks, benefits and alternatives for the proposed anesthesia with the patient or authorized representative who has indicated his/her understanding and acceptance.     Dental advisory given  Plan Discussed with: CRNA and Anesthesiologist  Anesthesia Plan Comments: (PAT note written 04/12/2019 by Myra Gianotti, PA-C. )       Anesthesia Quick Evaluation

## 2019-04-13 ENCOUNTER — Ambulatory Visit (HOSPITAL_COMMUNITY): Payer: Medicare Other | Admitting: Vascular Surgery

## 2019-04-13 ENCOUNTER — Ambulatory Visit (HOSPITAL_COMMUNITY): Payer: Medicare Other | Admitting: Anesthesiology

## 2019-04-13 ENCOUNTER — Encounter (HOSPITAL_COMMUNITY)
Admission: RE | Disposition: A | Discharge status: Home / Self Care | Payer: Self-pay | Source: Home / Self Care | Attending: Neurological Surgery

## 2019-04-13 ENCOUNTER — Observation Stay (HOSPITAL_COMMUNITY)
Admission: RE | Admit: 2019-04-13 | Discharge: 2019-04-14 | Disposition: A | Discharge status: Home / Self Care | Payer: Medicare Other | Attending: Neurological Surgery | Admitting: Neurological Surgery

## 2019-04-13 ENCOUNTER — Other Ambulatory Visit: Payer: Self-pay

## 2019-04-13 ENCOUNTER — Encounter (HOSPITAL_COMMUNITY): Payer: Self-pay | Admitting: *Deleted

## 2019-04-13 ENCOUNTER — Ambulatory Visit (HOSPITAL_COMMUNITY): Payer: Medicare Other

## 2019-04-13 DIAGNOSIS — Z801 Family history of malignant neoplasm of trachea, bronchus and lung: Secondary | ICD-10-CM | POA: Diagnosis not present

## 2019-04-13 DIAGNOSIS — M5412 Radiculopathy, cervical region: Secondary | ICD-10-CM | POA: Insufficient documentation

## 2019-04-13 DIAGNOSIS — G4730 Sleep apnea, unspecified: Secondary | ICD-10-CM | POA: Insufficient documentation

## 2019-04-13 DIAGNOSIS — Z888 Allergy status to other drugs, medicaments and biological substances status: Secondary | ICD-10-CM | POA: Diagnosis not present

## 2019-04-13 DIAGNOSIS — E119 Type 2 diabetes mellitus without complications: Secondary | ICD-10-CM | POA: Insufficient documentation

## 2019-04-13 DIAGNOSIS — Z8546 Personal history of malignant neoplasm of prostate: Secondary | ICD-10-CM | POA: Insufficient documentation

## 2019-04-13 DIAGNOSIS — Z791 Long term (current) use of non-steroidal anti-inflammatories (NSAID): Secondary | ICD-10-CM | POA: Diagnosis not present

## 2019-04-13 DIAGNOSIS — Z8042 Family history of malignant neoplasm of prostate: Secondary | ICD-10-CM | POA: Insufficient documentation

## 2019-04-13 DIAGNOSIS — Z7984 Long term (current) use of oral hypoglycemic drugs: Secondary | ICD-10-CM | POA: Insufficient documentation

## 2019-04-13 DIAGNOSIS — Z833 Family history of diabetes mellitus: Secondary | ICD-10-CM | POA: Diagnosis not present

## 2019-04-13 DIAGNOSIS — E785 Hyperlipidemia, unspecified: Secondary | ICD-10-CM | POA: Insufficient documentation

## 2019-04-13 DIAGNOSIS — M4802 Spinal stenosis, cervical region: Secondary | ICD-10-CM | POA: Diagnosis not present

## 2019-04-13 DIAGNOSIS — Z881 Allergy status to other antibiotic agents status: Secondary | ICD-10-CM | POA: Diagnosis not present

## 2019-04-13 DIAGNOSIS — Z9079 Acquired absence of other genital organ(s): Secondary | ICD-10-CM | POA: Diagnosis not present

## 2019-04-13 DIAGNOSIS — Z8249 Family history of ischemic heart disease and other diseases of the circulatory system: Secondary | ICD-10-CM | POA: Insufficient documentation

## 2019-04-13 DIAGNOSIS — Z803 Family history of malignant neoplasm of breast: Secondary | ICD-10-CM | POA: Insufficient documentation

## 2019-04-13 DIAGNOSIS — I10 Essential (primary) hypertension: Secondary | ICD-10-CM | POA: Diagnosis not present

## 2019-04-13 DIAGNOSIS — Z79899 Other long term (current) drug therapy: Secondary | ICD-10-CM | POA: Insufficient documentation

## 2019-04-13 DIAGNOSIS — Z419 Encounter for procedure for purposes other than remedying health state, unspecified: Secondary | ICD-10-CM

## 2019-04-13 DIAGNOSIS — Z981 Arthrodesis status: Secondary | ICD-10-CM

## 2019-04-13 HISTORY — PX: ANTERIOR CERVICAL DECOMP/DISCECTOMY FUSION: SHX1161

## 2019-04-13 LAB — GLUCOSE, CAPILLARY
Glucose-Capillary: 124 mg/dL — ABNORMAL HIGH (ref 70–99)
Glucose-Capillary: 108 mg/dL — ABNORMAL HIGH (ref 70–99)
Glucose-Capillary: 132 mg/dL — ABNORMAL HIGH (ref 70–99)
Glucose-Capillary: 139 mg/dL — ABNORMAL HIGH (ref 70–99)
Glucose-Capillary: 202 mg/dL — ABNORMAL HIGH (ref 70–99)

## 2019-04-13 SURGERY — ANTERIOR CERVICAL DECOMPRESSION/DISCECTOMY FUSION 2 LEVELS
Anesthesia: General | Site: Spine Cervical

## 2019-04-13 MED ORDER — ONDANSETRON HCL 4 MG/2ML IJ SOLN: 4.0000 mg | Freq: Four times a day (QID) | INTRAMUSCULAR | Status: DC | PRN

## 2019-04-13 MED ORDER — ACETAMINOPHEN 325 MG PO TABS
650.0000 mg | ORAL_TABLET | ORAL | Status: DC | PRN
  Administered 2019-04-13 (×2): 650 mg via ORAL
  Filled 2019-04-13 (×2): qty 2

## 2019-04-13 MED ORDER — PHENOL 1.4 % MT LIQD
1.0000 | OROMUCOSAL | Status: DC | PRN
  Filled 2019-04-13: qty 177

## 2019-04-13 MED ORDER — METFORMIN HCL 500 MG PO TABS
500.0000 mg | ORAL_TABLET | Freq: Two times a day (BID) | ORAL | Status: DC
  Administered 2019-04-13 – 2019-04-14 (×2): 500 mg via ORAL
  Filled 2019-04-13 (×2): qty 1

## 2019-04-13 MED ORDER — BUPIVACAINE HCL (PF) 0.25 % IJ SOLN
INTRAMUSCULAR | Status: AC
  Filled 2019-04-13: qty 30

## 2019-04-13 MED ORDER — MIDAZOLAM HCL 2 MG/2ML IJ SOLN
INTRAMUSCULAR | Status: AC
  Filled 2019-04-13: qty 2

## 2019-04-13 MED ORDER — HYDROMORPHONE HCL 1 MG/ML IJ SOLN
INTRAMUSCULAR | Status: AC
  Filled 2019-04-13: qty 1

## 2019-04-13 MED ORDER — OXYCODONE HCL 5 MG PO TABS
5.0000 mg | ORAL_TABLET | ORAL | Status: DC | PRN
  Administered 2019-04-13 – 2019-04-14 (×4): 5 mg via ORAL
  Filled 2019-04-13 (×4): qty 1

## 2019-04-13 MED ORDER — 0.9 % SODIUM CHLORIDE (POUR BTL) OPTIME
TOPICAL | Status: DC | PRN
  Administered 2019-04-13: 1000 mL

## 2019-04-13 MED ORDER — SODIUM CHLORIDE 0.9% FLUSH
3.0000 mL | Freq: Two times a day (BID) | INTRAVENOUS | Status: DC
  Administered 2019-04-13: 3 mL via INTRAVENOUS

## 2019-04-13 MED ORDER — LISINOPRIL 20 MG PO TABS
20.0000 mg | ORAL_TABLET | Freq: Every day | ORAL | Status: DC
  Administered 2019-04-14: 20 mg via ORAL
  Filled 2019-04-13: qty 1

## 2019-04-13 MED ORDER — SUGAMMADEX SODIUM 200 MG/2ML IV SOLN
INTRAVENOUS | Status: DC | PRN
  Administered 2019-04-13: 400 mg via INTRAVENOUS

## 2019-04-13 MED ORDER — CEFAZOLIN SODIUM-DEXTROSE 2-4 GM/100ML-% IV SOLN
INTRAVENOUS | Status: AC
  Filled 2019-04-13: qty 100

## 2019-04-13 MED ORDER — THROMBIN 5000 UNITS EX SOLR
OROMUCOSAL | Status: DC | PRN
  Administered 2019-04-13 (×2): 5 mL via TOPICAL

## 2019-04-13 MED ORDER — CHLORHEXIDINE GLUCONATE CLOTH 2 % EX PADS: 6.0000 | MEDICATED_PAD | Freq: Once | CUTANEOUS | Status: DC

## 2019-04-13 MED ORDER — ROCURONIUM BROMIDE 10 MG/ML (PF) SYRINGE
PREFILLED_SYRINGE | INTRAVENOUS | Status: DC | PRN
  Administered 2019-04-13: 50 mg via INTRAVENOUS
  Administered 2019-04-13: 100 mg via INTRAVENOUS

## 2019-04-13 MED ORDER — MORPHINE SULFATE (PF) 2 MG/ML IV SOLN
2.0000 mg | INTRAVENOUS | Status: DC | PRN
  Administered 2019-04-13: 2 mg via INTRAVENOUS
  Filled 2019-04-13: qty 1

## 2019-04-13 MED ORDER — MIDAZOLAM HCL 2 MG/2ML IJ SOLN
INTRAMUSCULAR | Status: DC | PRN
  Administered 2019-04-13: 2 mg via INTRAVENOUS

## 2019-04-13 MED ORDER — DEXAMETHASONE SODIUM PHOSPHATE 10 MG/ML IJ SOLN
INTRAMUSCULAR | Status: AC
  Filled 2019-04-13: qty 1

## 2019-04-13 MED ORDER — GLIMEPIRIDE 2 MG PO TABS
2.0000 mg | ORAL_TABLET | Freq: Every day | ORAL | Status: DC
  Administered 2019-04-14: 2 mg via ORAL
  Filled 2019-04-13: qty 1

## 2019-04-13 MED ORDER — LIDOCAINE 2% (20 MG/ML) 5 ML SYRINGE
INTRAMUSCULAR | Status: DC | PRN
  Administered 2019-04-13: 60 mg via INTRAVENOUS

## 2019-04-13 MED ORDER — FENTANYL CITRATE (PF) 250 MCG/5ML IJ SOLN
INTRAMUSCULAR | Status: DC | PRN
  Administered 2019-04-13 (×2): 50 ug via INTRAVENOUS
  Administered 2019-04-13: 100 ug via INTRAVENOUS
  Administered 2019-04-13: 50 ug via INTRAVENOUS

## 2019-04-13 MED ORDER — HEMOSTATIC AGENTS (NO CHARGE) OPTIME
TOPICAL | Status: DC | PRN
  Administered 2019-04-13: 1 via TOPICAL

## 2019-04-13 MED ORDER — DIAZEPAM 5 MG/ML IJ SOLN
INTRAMUSCULAR | Status: AC
  Filled 2019-04-13: qty 2

## 2019-04-13 MED ORDER — FENTANYL CITRATE (PF) 250 MCG/5ML IJ SOLN
INTRAMUSCULAR | Status: AC
  Filled 2019-04-13: qty 5

## 2019-04-13 MED ORDER — ONDANSETRON HCL 4 MG PO TABS: 4.0000 mg | ORAL_TABLET | Freq: Four times a day (QID) | ORAL | Status: DC | PRN

## 2019-04-13 MED ORDER — DEXAMETHASONE SODIUM PHOSPHATE 10 MG/ML IJ SOLN
10.0000 mg | Freq: Once | INTRAMUSCULAR | Status: AC
  Administered 2019-04-13: 10 mg via INTRAVENOUS

## 2019-04-13 MED ORDER — MENTHOL 3 MG MT LOZG
1.0000 | LOZENGE | OROMUCOSAL | Status: DC | PRN
  Filled 2019-04-13: qty 9

## 2019-04-13 MED ORDER — THROMBIN 5000 UNITS EX SOLR
CUTANEOUS | Status: AC
  Filled 2019-04-13: qty 5000

## 2019-04-13 MED ORDER — METHOCARBAMOL 1000 MG/10ML IJ SOLN
500.0000 mg | Freq: Four times a day (QID) | INTRAVENOUS | Status: DC | PRN
  Filled 2019-04-13: qty 5

## 2019-04-13 MED ORDER — ONDANSETRON HCL 4 MG/2ML IJ SOLN
INTRAMUSCULAR | Status: DC | PRN
  Administered 2019-04-13: 4 mg via INTRAVENOUS

## 2019-04-13 MED ORDER — METHOCARBAMOL 500 MG PO TABS
500.0000 mg | ORAL_TABLET | Freq: Four times a day (QID) | ORAL | Status: DC | PRN
  Administered 2019-04-13 – 2019-04-14 (×3): 500 mg via ORAL
  Filled 2019-04-13 (×3): qty 1

## 2019-04-13 MED ORDER — HYDROCHLOROTHIAZIDE 25 MG PO TABS
25.0000 mg | ORAL_TABLET | Freq: Every day | ORAL | Status: DC
  Administered 2019-04-14: 25 mg via ORAL
  Filled 2019-04-13: qty 1

## 2019-04-13 MED ORDER — INSULIN ASPART 100 UNIT/ML ~~LOC~~ SOLN
0.0000 [IU] | Freq: Three times a day (TID) | SUBCUTANEOUS | Status: DC
  Administered 2019-04-14: 3 [IU] via SUBCUTANEOUS

## 2019-04-13 MED ORDER — LISINOPRIL-HYDROCHLOROTHIAZIDE 20-25 MG PO TABS: 1.0000 | ORAL_TABLET | Freq: Every day | ORAL | Status: DC

## 2019-04-13 MED ORDER — DIAZEPAM 5 MG/ML IJ SOLN
2.5000 mg | Freq: Once | INTRAMUSCULAR | Status: AC
  Administered 2019-04-13: 2.5 mg via INTRAVENOUS

## 2019-04-13 MED ORDER — PROPOFOL 10 MG/ML IV BOLUS
INTRAVENOUS | Status: AC
  Filled 2019-04-13: qty 20

## 2019-04-13 MED ORDER — SODIUM CHLORIDE 0.9 % IV SOLN: 250.0000 mL | INTRAVENOUS | Status: DC

## 2019-04-13 MED ORDER — SODIUM CHLORIDE 0.9 % IV SOLN
INTRAVENOUS | Status: DC | PRN
  Administered 2019-04-13: 500 mL

## 2019-04-13 MED ORDER — LACTATED RINGERS IV SOLN
INTRAVENOUS | Status: DC
  Administered 2019-04-13 (×2): via INTRAVENOUS

## 2019-04-13 MED ORDER — SODIUM CHLORIDE 0.9% FLUSH: 3.0000 mL | INTRAVENOUS | Status: DC | PRN

## 2019-04-13 MED ORDER — CEFAZOLIN SODIUM-DEXTROSE 2-4 GM/100ML-% IV SOLN
2.0000 g | Freq: Three times a day (TID) | INTRAVENOUS | Status: AC
  Administered 2019-04-13 – 2019-04-14 (×2): 2 g via INTRAVENOUS
  Filled 2019-04-13 (×2): qty 100

## 2019-04-13 MED ORDER — SENNA 8.6 MG PO TABS
1.0000 | ORAL_TABLET | Freq: Two times a day (BID) | ORAL | Status: DC
  Administered 2019-04-13: 8.6 mg via ORAL
  Filled 2019-04-13: qty 1

## 2019-04-13 MED ORDER — ACETAMINOPHEN 650 MG RE SUPP: 650.0000 mg | RECTAL | Status: DC | PRN

## 2019-04-13 MED ORDER — CEFAZOLIN SODIUM-DEXTROSE 2-4 GM/100ML-% IV SOLN
2.0000 g | INTRAVENOUS | Status: AC
  Administered 2019-04-13: 2 g via INTRAVENOUS

## 2019-04-13 MED ORDER — BUPIVACAINE HCL (PF) 0.25 % IJ SOLN
INTRAMUSCULAR | Status: DC | PRN
  Administered 2019-04-13: 4 mL

## 2019-04-13 MED ORDER — POTASSIUM CHLORIDE IN NACL 20-0.9 MEQ/L-% IV SOLN: INTRAVENOUS | Status: DC

## 2019-04-13 MED ORDER — SUCCINYLCHOLINE CHLORIDE 200 MG/10ML IV SOSY
PREFILLED_SYRINGE | INTRAVENOUS | Status: AC
  Filled 2019-04-13: qty 10

## 2019-04-13 MED ORDER — PROPOFOL 10 MG/ML IV BOLUS
INTRAVENOUS | Status: DC | PRN
  Administered 2019-04-13: 150 mg via INTRAVENOUS

## 2019-04-13 MED ORDER — METOPROLOL TARTRATE 25 MG PO TABS
100.0000 mg | ORAL_TABLET | Freq: Two times a day (BID) | ORAL | Status: DC
  Administered 2019-04-13: 100 mg via ORAL
  Filled 2019-04-13: qty 4

## 2019-04-13 MED ORDER — HYDROMORPHONE HCL 1 MG/ML IJ SOLN
0.2500 mg | INTRAMUSCULAR | Status: DC | PRN
  Administered 2019-04-13 (×2): 0.5 mg via INTRAVENOUS

## 2019-04-13 MED ORDER — PHENYLEPHRINE HCL-NACL 10-0.9 MG/250ML-% IV SOLN
INTRAVENOUS | Status: DC | PRN
  Administered 2019-04-13: 25 ug/min via INTRAVENOUS

## 2019-04-13 SURGICAL SUPPLY — 64 items
APL SKNCLS STERI-STRIP NONHPOA (GAUZE/BANDAGES/DRESSINGS) ×1
BAG DECANTER FOR FLEXI CONT (MISCELLANEOUS) ×3 IMPLANT
BASKET BONE COLLECTION (BASKET) ×2 IMPLANT
BENZOIN TINCTURE PRP APPL 2/3 (GAUZE/BANDAGES/DRESSINGS) ×3 IMPLANT
BIT DRILL 14MM (INSTRUMENTS) IMPLANT
BUR MATCHSTICK NEURO 3.0 LAGG (BURR) ×3 IMPLANT
CANISTER SUCT 3000ML PPV (MISCELLANEOUS) ×3 IMPLANT
CARTRIDGE OIL MAESTRO DRILL (MISCELLANEOUS) ×1 IMPLANT
CLOSURE WOUND 1/2 X4 (GAUZE/BANDAGES/DRESSINGS) ×1
COVER WAND RF STERILE (DRAPES) ×1 IMPLANT
DIFFUSER DRILL AIR PNEUMATIC (MISCELLANEOUS) ×3 IMPLANT
DRAPE C-ARM 42X72 X-RAY (DRAPES) ×6 IMPLANT
DRAPE LAPAROTOMY 100X72 PEDS (DRAPES) ×3 IMPLANT
DRAPE MICROSCOPE LEICA (MISCELLANEOUS) ×3 IMPLANT
DRILL 14MM (INSTRUMENTS) ×3
DRSG OPSITE POSTOP 3X4 (GAUZE/BANDAGES/DRESSINGS) ×2 IMPLANT
DURAPREP 6ML APPLICATOR 50/CS (WOUND CARE) ×3 IMPLANT
ELECT COATED BLADE 2.86 ST (ELECTRODE) ×3 IMPLANT
ELECT REM PT RETURN 9FT ADLT (ELECTROSURGICAL) ×3
ELECTRODE REM PT RTRN 9FT ADLT (ELECTROSURGICAL) ×1 IMPLANT
GAUZE 4X4 16PLY RFD (DISPOSABLE) IMPLANT
GLOVE BIO SURGEON STRL SZ7 (GLOVE) ×2 IMPLANT
GLOVE BIO SURGEON STRL SZ8 (GLOVE) ×5 IMPLANT
GLOVE BIOGEL PI IND STRL 6.5 (GLOVE) IMPLANT
GLOVE BIOGEL PI IND STRL 7.0 (GLOVE) IMPLANT
GLOVE BIOGEL PI IND STRL 7.5 (GLOVE) IMPLANT
GLOVE BIOGEL PI IND STRL 8 (GLOVE) IMPLANT
GLOVE BIOGEL PI INDICATOR 6.5 (GLOVE) ×4
GLOVE BIOGEL PI INDICATOR 7.0 (GLOVE) ×2
GLOVE BIOGEL PI INDICATOR 7.5 (GLOVE) ×2
GLOVE BIOGEL PI INDICATOR 8 (GLOVE) ×4
GLOVE ECLIPSE 6.5 STRL STRAW (GLOVE) ×2 IMPLANT
GLOVE ECLIPSE 7.0 STRL STRAW (GLOVE) ×2 IMPLANT
GLOVE ECLIPSE 7.5 STRL STRAW (GLOVE) ×6 IMPLANT
GLOVE SURG SS PI 6.0 STRL IVOR (GLOVE) ×2 IMPLANT
GOWN STRL REUS W/ TWL LRG LVL3 (GOWN DISPOSABLE) IMPLANT
GOWN STRL REUS W/ TWL XL LVL3 (GOWN DISPOSABLE) ×1 IMPLANT
GOWN STRL REUS W/TWL 2XL LVL3 (GOWN DISPOSABLE) ×2 IMPLANT
GOWN STRL REUS W/TWL LRG LVL3 (GOWN DISPOSABLE) ×12
GOWN STRL REUS W/TWL XL LVL3 (GOWN DISPOSABLE) ×6
HEMOSTAT POWDER KIT SURGIFOAM (HEMOSTASIS) ×5 IMPLANT
KIT BASIN OR (CUSTOM PROCEDURE TRAY) ×3 IMPLANT
KIT TURNOVER KIT B (KITS) ×3 IMPLANT
NDL HYPO 25X1 1.5 SAFETY (NEEDLE) ×1 IMPLANT
NDL SPNL 20GX3.5 QUINCKE YW (NEEDLE) ×1 IMPLANT
NEEDLE HYPO 25X1 1.5 SAFETY (NEEDLE) ×3 IMPLANT
NEEDLE SPNL 20GX3.5 QUINCKE YW (NEEDLE) ×3 IMPLANT
NS IRRIG 1000ML POUR BTL (IV SOLUTION) ×3 IMPLANT
OIL CARTRIDGE MAESTRO DRILL (MISCELLANEOUS) ×3
PACK LAMINECTOMY NEURO (CUSTOM PROCEDURE TRAY) ×3 IMPLANT
PAD ARMBOARD 7.5X6 YLW CONV (MISCELLANEOUS) ×11 IMPLANT
PIN DISTRACTION 14MM (PIN) ×6 IMPLANT
PLATE TRESTLE LUXE 32L L2 (Plate) ×2 IMPLANT
RUBBERBAND STERILE (MISCELLANEOUS) ×6 IMPLANT
SCREW CANN 4X16 SS S/DRILL (Screw) ×12 IMPLANT
SPACER PTI-C 7X16X14 IDENTI (Spacer) ×4 IMPLANT
SPONGE INTESTINAL PEANUT (DISPOSABLE) ×3 IMPLANT
SPONGE SURGIFOAM ABS GEL SZ50 (HEMOSTASIS) ×3 IMPLANT
STRIP CLOSURE SKIN 1/2X4 (GAUZE/BANDAGES/DRESSINGS) ×2 IMPLANT
SUT VIC AB 3-0 SH 8-18 (SUTURE) ×6 IMPLANT
SUT VICRYL 4-0 PS2 18IN ABS (SUTURE) ×2 IMPLANT
TOWEL GREEN STERILE (TOWEL DISPOSABLE) ×3 IMPLANT
TOWEL GREEN STERILE FF (TOWEL DISPOSABLE) ×3 IMPLANT
WATER STERILE IRR 1000ML POUR (IV SOLUTION) ×3 IMPLANT

## 2019-04-13 NOTE — Op Note (Signed)
04/13/2019  3:22 PM  PATIENT:  Adam Sutton  63 y.o. male  PRE-OPERATIVE DIAGNOSIS: Cervical spinal stenosis C5-6 and C6-7, neck and right arm pain  POST-OPERATIVE DIAGNOSIS:  same  PROCEDURE:  1. Decompressive anterior cervical discectomy C5-6 C6-7, 2. Anterior cervical arthrodesis C5-6 C6-7 utilizing a porous titanium interbody cage packed with locally harvested morcellized autologous bone graft, 3. Anterior cervical plating C5-7 inclusive utilizing a Alphatec plate  SURGEON:  Sherley Bounds, MD  ASSISTANTS: Glenford Peers, FNP  ANESTHESIA:   General  EBL: 200 ml  Total I/O In: 1000 [I.V.:1000] Out: 200 [Blood:200]  BLOOD ADMINISTERED: none  DRAINS: none  SPECIMEN:  none  INDICATION FOR PROCEDURE: This patient presented with neck and right arm pain. Imaging showed significant spinal stenosis C5-6 and C6-7 with central stenosis and foraminal stenosis and cord compression at C5-6. The patient tried conservative measures without relief. Pain was debilitating. Recommended ACDF with plating. Patient understood the risks, benefits, and alternatives and potential outcomes and wished to proceed.  PROCEDURE DETAILS: Patient was brought to the operating room placed under general endotracheal anesthesia. Patient was placed in the supine position on the operating room table. The neck was prepped with Duraprep and draped in a sterile fashion.   Three cc of local anesthesia was injected and a transverse incision was made on the right side of the neck.  Dissection was carried down thru the subcutaneous tissue and the platysma was  elevated, opened, and undermined with Metzenbaum scissors.  Dissection was then carried out thru an avascular plane leaving the sternocleidomastoid carotid artery and jugular vein laterally and the trachea and esophagus medially. The ventral aspect of the vertebral column was identified and a localizing x-ray was taken. The C5-6 level was identified. The longus colli  muscles were then elevated and the retractor was placed to expose C5-6 and C6-7. The annulus at both levels was incised and the disc space entered.  Exact same discectomy was performed at both levels discectomy was performed with micro-curettes and pituitary rongeurs. I then used the high-speed drill to drill the endplates down to the level of the posterior longitudinal ligament. The drill shavings were saved in a mucous trap for later arthrodesis. The operating microscope was draped and brought into the field provided additional magnification, illumination and visualization. Discectomy was continued posteriorly thru the disc space. Posterior longitudinal ligament was opened with a nerve hook, and then removed along with disc herniation and osteophytes, decompressing the spinal canal and thecal sac. We then continued to remove osteophytic overgrowth and disc material decompressing the neural foramina and exiting nerve roots bilaterally.  Spent considerable more time on the patient's right side because of the right sided symptoms.  The uncovertebral joint was removed at C5-6 and C6-7 on the right to fully decompress the right C6 and C7 nerve roots.  The scope was angled up and down to help decompress and undercut the vertebral bodies. Once the decompression was completed we could pass a nerve hook circumferentially to assure adequate decompression in the midline and in the neural foramina. So by both visualization and palpation we felt we had an adequate decompression of the neural elements. We then measured the height of the intravertebral disc space and selected 7 millimeter porous titanium interbody cages packed with autograft. It was then gently positioned in the intravertebral disc space(s) and countersunk. I then used a 32 mm Alphatec plate and placed variable angle screws into the vertebral bodies of each level and locked them into position. The  wound was irrigated with bacitracin solution, checked for  hemostasis which was established and confirmed. Once meticulous hemostasis was achieved, we then proceeded with closure. The platysma was closed with interrupted 3-0 undyed Vicryl suture, the subcuticular layer was closed with interrupted 3-0 undyed Vicryl suture. The skin edges were approximated with steristrips. The drapes were removed. A sterile dressing was applied. The patient was then awakened from general anesthesia and transferred to the recovery room in stable condition. At the end of the procedure all sponge, needle and instrument counts were correct.   PLAN OF CARE: Admit for overnight observation  PATIENT DISPOSITION:  PACU - hemodynamically stable.   Delay start of Pharmacological VTE agent (>24hrs) due to surgical blood loss or risk of bleeding:  yes

## 2019-04-13 NOTE — Anesthesia Postprocedure Evaluation (Signed)
Anesthesia Post Note  Patient: Adam Sutton  Procedure(s) Performed: ANTERIOR CERVICAL DECOMPRESSION FUSION CERVICAL FIVE-SIX,CERVICAL SIX-SEVEN (N/A Spine Cervical)     Patient location during evaluation: PACU Anesthesia Type: General Level of consciousness: awake Pain management: pain level controlled Vital Signs Assessment: post-procedure vital signs reviewed and stable Respiratory status: spontaneous breathing Cardiovascular status: stable Postop Assessment: no apparent nausea or vomiting Anesthetic complications: no    Last Vitals:  Vitals:   04/13/19 0957 04/13/19 1545  BP: (!) 141/100   Pulse: 65   Resp: 18   Temp: 36.8 C 36.8 C  SpO2: 98%     Last Pain:  Vitals:   04/13/19 1010  TempSrc:   PainSc: 2                  Rivers Hamrick

## 2019-04-13 NOTE — Anesthesia Procedure Notes (Signed)
Procedure Name: Intubation Date/Time: 04/13/2019 12:35 PM Performed by: Valda Favia, CRNA Pre-anesthesia Checklist: Patient identified, Emergency Drugs available, Suction available, Patient being monitored and Timeout performed Patient Re-evaluated:Patient Re-evaluated prior to induction Oxygen Delivery Method: Circle system utilized Preoxygenation: Pre-oxygenation with 100% oxygen Induction Type: IV induction and Rapid sequence Laryngoscope Size: Glidescope and 4 Grade View: Grade I Tube type: Oral Tube size: 7.5 mm Number of attempts: 1 Airway Equipment and Method: Video-laryngoscopy and Stylet Placement Confirmation: ETT inserted through vocal cords under direct vision,  positive ETCO2 and breath sounds checked- equal and bilateral Secured at: 23 cm Tube secured with: Tape Dental Injury: Teeth and Oropharynx as per pre-operative assessment  Comments: Utilized Glidescope due to numbness and tingling with neck extension

## 2019-04-13 NOTE — H&P (Signed)
Anesthesia H&P Update: History and Physical Exam reviewed; patient is OK for planned anesthetic and procedure.  Subjective:   Patient is a 63 y.o. male admitted for neck and arm pain. The patient first presented to me with complaints of neck pain, shooting pains in the arm(s) and numbness of the arm(s). Onset of symptoms was several months ago. The pain is described as aching and occurs all day. The pain is rated severe, and is located in the neck and radiates to the arms. The symptoms have been progressive. Symptoms are exacerbated by extending head backwards, and are relieved by none.  Previous work up includes MRI of cervical spine, results: spinal stenosis.  Past Medical History:  Diagnosis Date  . Cancer [facility])    prostate cancer  . Cervical disc disorder with radiculopathy    right arm numbness  . Cervical stenosis of spine   . Diabetes mellitus without complication ([place])   . Hypertension   . Incontinence   . Lipidemia   . Nerve pain due to spinal stenosis   . Sleep apnea 10/14/11   STOP BANG SCORE 4  . Vertigo     Past Surgical History:  Procedure Laterality Date  . bilateral jaw surgery     40+ yrs ago- for TMJ PROBLEMS AND WISDOM TEETH IMPACTIONS  . PROSTATE SURGERY    . ROBOT ASSISTED LAPAROSCOPIC RADICAL PROSTATECTOMY  10/22/2011   Procedure: ROBOTIC ASSISTED LAPAROSCOPIC RADICAL PROSTATECTOMY;  Surgeon: Molli Hazard, MD;  Location: [facility];  Service: Urology;  Laterality: N/A;     . TONSILLECTOMY    . UMBILICAL HERNIA REPAIR    . VASECTOMY      Allergies  Allergen Reactions  . Tape Other (See Comments)    blisters  . Sulfamethoxazole-Trimethoprim Rash    Social History   Tobacco Use  . Smoking status: Never Smoker  . Smokeless tobacco: Never Used  Substance Use Topics  . Alcohol use: Yes    Comment: OCCAS BEER    Family History  Problem Relation Age of Onset  . Cancer Mother        lung  . Cancer Father        prostate  . Diabetes Father   .  Heart disease Father   . Hyperlipidemia Father   . Hypertension Father   . Cancer Maternal Grandmother        breast   Prior to Admission medications   Medication Sig Start Date End Date Taking? Authorizing Provider  glimepiride (AMARYL) 2 MG tablet Take 1 tablet (2 mg total) by mouth daily with breakfast. 07/02/18  Yes Stacks, Cletus Gash, MD  lisinopril-hydrochlorothiazide (PRINZIDE,ZESTORETIC) 20-25 MG tablet Take 1 tablet by mouth daily before breakfast. 07/02/18  Yes Claretta Fraise, MD  metFORMIN (GLUCOPHAGE) 500 MG tablet Take 1 tablet (500 mg total) by mouth 2 (two) times daily with a meal. 07/02/18  Yes Claretta Fraise, MD  metoprolol tartrate (LOPRESSOR) 100 MG tablet Take 1 tablet (100 mg total) by mouth 2 (two) times daily. 07/02/18  Yes Stacks, Cletus Gash, MD  oxyCODONE-acetaminophen (PERCOCET/ROXICET) 5-325 MG tablet Take 1 tablet by mouth every 4 (four) hours as needed for pain. 03/29/19  Yes [provider]  pravastatin (PRAVACHOL) 40 MG tablet Take 1 tablet (40 mg total) by mouth at bedtime. 07/02/18  Yes Claretta Fraise, MD  meclizine (ANTIVERT) 25 MG tablet Take 1 tablet (25 mg total) by mouth 3 (three) times daily. Patient not taking: Reported on 04/07/2019 08/23/18   Barton Dubois, MD  ONETOUCH VERIO test strip Check glucose twice daily 03/29/18   Claretta Fraise, MD  predniSONE (STERAPRED UNI-PAK 21 TAB) 10 MG (21) TBPK tablet Use as directed on back of pill pack Patient not taking: Reported on 04/07/2019 03/17/19   Loman Brooklyn, FNP     Review of Systems  Positive ROS: neg  All other systems have been reviewed and were otherwise negative with the exception of those mentioned in the HPI and as above.  Objective: Vital signs in last 24 hours: Temp:  [36.8 C] 36.8 C (10/28 0957) Pulse Rate:  [65] 65 (10/28 0957) Resp:  [18] 18 (10/28 0957) BP: (141)/(100) 141/100 (10/28 0957) SpO2:  [98 %] 98 % (10/28 0957) Weight:  [108.3 kg] 108.3 kg (10/28 0957)  General  Appearance: Alert, cooperative, no distress, appears stated age Head: Normocephalic, without obvious abnormality, atraumatic Eyes: PERRL, conjunctiva/corneas clear, EOM's intact      Neck: Supple, symmetrical, trachea midline, Back: Symmetric, no curvature, ROM normal, no CVA tenderness Lungs:  respirations unlabored Heart: Regular rate and rhythm Abdomen: Soft, non-tender Extremities: Extremities normal, atraumatic, no cyanosis or edema Pulses: 2+ and symmetric all extremities Skin: Skin color, texture, turgor normal, no rashes or lesions  NEUROLOGIC:  Mental status: Alert and oriented x4, no aphasia, good attention span, fund of knowledge and memory  Motor Exam - grossly normal Sensory Exam - grossly normal Reflexes: 2+ Coordination - grossly normal Gait - grossly normal Balance - grossly normal Cranial Nerves: I: smell Not tested  II: visual acuity  OS: nl    OD: nl  II: visual fields Full to confrontation  II: pupils Equal, round, reactive to light  III,VII: ptosis None  III,IV,VI: extraocular muscles  Full ROM  V: mastication Normal  V: facial light touch sensation  Normal  V,VII: corneal reflex  Present  VII: facial muscle function - upper  Normal  VII: facial muscle function - lower Normal  VIII: hearing Not tested  IX: soft palate elevation  Normal  IX,X: gag reflex Present  XI: trapezius strength  5/5  XI: sternocleidomastoid strength 5/5  XI: neck flexion strength  5/5  XII: tongue strength  Normal    Data Review Lab Results  Component Value Date   WBC 7.3 04/11/2019   HGB 15.7 04/11/2019   HCT 48.1 04/11/2019   MCV 82.6 04/11/2019   PLT 261 04/11/2019   Lab Results  Component Value Date   NA 136 04/11/2019   K 3.0 (L) 04/11/2019   CL 94 (L) 04/11/2019   CO2 32 04/11/2019   BUN 21 04/11/2019   CREATININE 1.29 (H) 04/11/2019   GLUCOSE 109 (H) 04/11/2019   Lab Results  Component Value Date   INR 1.0 04/11/2019    Assessment:   Cervical neck  pain with herniated nucleus pulposus/ spondylosis/ stenosis at C5-6 C6-7. Estimated body mass index is 36.31 kg/m as calculated from the following:   Height as of this encounter: 5\' 8"  (1.727 m).   Weight as of this encounter: 108.3 kg.  Patient has failed conservative therapy. Planned surgery : ACDF C5-6 C6-7  Plan:   I explained the condition and procedure to the patient and answered any questions.  Patient wishes to proceed with procedure as planned. Understands risks/ benefits/ and expected or typical outcomes.  Adam Sutton 04/13/2019 12:06 PM

## 2019-04-13 NOTE — Transfer of Care (Signed)
Immediate Anesthesia Transfer of Care Note  Patient: Adam Sutton  Procedure(s) Performed: ANTERIOR CERVICAL DECOMPRESSION FUSION CERVICAL FIVE-SIX,CERVICAL SIX-SEVEN (N/A Spine Cervical)  Patient Location: PACU  Anesthesia Type:General  Level of Consciousness: awake, alert  and oriented  Airway & Oxygen Therapy: Patient Spontanous Breathing and Patient connected to face mask oxygen  Post-op Assessment: Report given to RN, Post -op Vital signs reviewed and stable and Patient moving all extremities X 4  Post vital signs: Reviewed and stable  Last Vitals:  Vitals Value Taken Time  BP 153/97 04/13/19 1544  Temp 36.8 C 04/13/19 1545  Pulse 69 04/13/19 1553  Resp 14 04/13/19 1553  SpO2 93 % 04/13/19 1553  Vitals shown include unvalidated device data.  Last Pain:  Vitals:   04/13/19 1010  TempSrc:   PainSc: 2       Patients Stated Pain Goal: 0 ([id number])  Complications: No apparent anesthesia complications

## 2019-04-14 ENCOUNTER — Encounter (HOSPITAL_COMMUNITY): Payer: Self-pay | Admitting: Neurological Surgery

## 2019-04-14 DIAGNOSIS — M4802 Spinal stenosis, cervical region: Secondary | ICD-10-CM | POA: Diagnosis not present

## 2019-04-14 LAB — GLUCOSE, CAPILLARY: Glucose-Capillary: 170 mg/dL — ABNORMAL HIGH (ref 70–99)

## 2019-04-14 MED ORDER — MELOXICAM 7.5 MG PO TABS
7.5000 mg | ORAL_TABLET | Freq: Two times a day (BID) | ORAL | Status: DC
  Filled 2019-04-14: qty 1

## 2019-04-14 MED ORDER — METHOCARBAMOL 500 MG PO TABS: 500.0000 mg | ORAL_TABLET | Freq: Four times a day (QID) | ORAL | 1 refills | Status: DC | PRN

## 2019-04-14 MED ORDER — OXYCODONE-ACETAMINOPHEN 5-325 MG PO TABS: 1.0000 | ORAL_TABLET | ORAL | 0 refills | Status: DC | PRN

## 2019-04-14 MED ORDER — MELOXICAM 7.5 MG PO TABS: 7.5000 mg | ORAL_TABLET | Freq: Two times a day (BID) | ORAL | 0 refills | Status: DC

## 2019-04-14 NOTE — Discharge Summary (Signed)
Physician Discharge Summary  Patient ID: Adam Sutton MRN: [id number] DOB/AGE: Oct 16, 1955 63 y.o.  Admit date: 04/13/2019 Discharge date: 04/14/2019  Admission Diagnoses: cervical stenosis/ radiculopathy    Discharge Diagnoses: same   Discharged Condition: good  Hospital Course: The patient was admitted on 04/13/2019 and taken to the operating room where the patient underwent ACDF C5-6 C6-7. The patient tolerated the procedure well and was taken to the recovery room and then to the floor in stable condition. The hospital course was routine. There were no complications. The wound remained clean dry and intact. Pt had appropriate neck soreness. No complaints of arm pain or new N/T/W. The patient remained afebrile with stable vital signs, and tolerated a regular diet. The patient continued to increase activities, and pain was well controlled with oral pain medications.   Consults: None  Significant Diagnostic Studies:  Results for orders placed or performed during the hospital encounter of 04/13/19  Glucose, capillary  Result Value Ref Range   Glucose-Capillary 124 (H) 70 - 99 mg/dL  Glucose, capillary  Result Value Ref Range   Glucose-Capillary 108 (H) 70 - 99 mg/dL   Comment 1 Notify RN    Comment 2 Document in Chart   Glucose, capillary  Result Value Ref Range   Glucose-Capillary 132 (H) 70 - 99 mg/dL  Glucose, capillary  Result Value Ref Range   Glucose-Capillary 139 (H) 70 - 99 mg/dL   Comment 1 Notify RN    Comment 2 Document in Chart   Glucose, capillary  Result Value Ref Range   Glucose-Capillary 202 (H) 70 - 99 mg/dL   Comment 1 Notify RN    Comment 2 Document in Chart   Glucose, capillary  Result Value Ref Range   Glucose-Capillary 170 (H) 70 - 99 mg/dL   Comment 1 Notify RN    Comment 2 Document in Chart     Chest 2 View  Result Date: 04/11/2019 CLINICAL DATA:  Pre admit for ACDF ,hrn / diabetes ,,no chest comp EXAM: CHEST - 2 VIEW COMPARISON:  Chest  radiograph 08/22/2018 FINDINGS: Stable cardiomediastinal contours with mildly enlarged heart size. The lungs are clear. No pneumothorax or pleural effusion. No acute finding in the visualized skeleton. IMPRESSION: No acute cardiopulmonary finding. Electronically Signed   By: Audie Pinto M.D.   On: 04/11/2019 15:55   Dg Cervical Spine 1 View  Result Date: 04/13/2019 CLINICAL DATA:  ACDF C5 through C7 EXAM: DG CERVICAL SPINE - 1 VIEW; DG C-ARM 1-60 MIN COMPARISON:  MRI cervical spine 08/06/2018 FINDINGS: Lateral C-arm images of the cervical spine were obtained. ACDF C5-6 and C6-7. Anterior plate and interbody spacers in good position. IMPRESSION: ACDF C5-6 and C6-7. Electronically Signed   By: Franchot Gallo M.D.   On: 04/13/2019 15:39   Dg C-arm 1-60 Min  Result Date: 04/13/2019 CLINICAL DATA:  ACDF C5 through C7 EXAM: DG CERVICAL SPINE - 1 VIEW; DG C-ARM 1-60 MIN COMPARISON:  MRI cervical spine 08/06/2018 FINDINGS: Lateral C-arm images of the cervical spine were obtained. ACDF C5-6 and C6-7. Anterior plate and interbody spacers in good position. IMPRESSION: ACDF C5-6 and C6-7. Electronically Signed   By: Franchot Gallo M.D.   On: 04/13/2019 15:39    Antibiotics:  Anti-infectives (From admission, onward)   Start     Dose/Rate Route Frequency Ordered Stop   04/13/19 2030  ceFAZolin (ANCEF) IVPB 2g/100 mL premix     2 g 200 mL/hr over 30 Minutes Intravenous Every 8 hours 04/13/19 1715 04/14/19  [id number]   04/13/19 1334  bacitracin 50,000 Units in sodium chloride 0.9 % 500 mL irrigation  Status:  Discontinued       As needed 04/13/19 1334 04/13/19 1536   04/13/19 0815  ceFAZolin (ANCEF) IVPB 2g/100 mL premix     2 g 200 mL/hr over 30 Minutes Intravenous On call to O.R. 04/13/19 0804 04/13/19 1246   04/13/19 0812  ceFAZolin (ANCEF) 2-4 GM/100ML-% IVPB    Note to Pharmacy: [facility]   : cabinet override      04/13/19 0812 04/13/19 1246      Discharge Exam: Blood pressure 123/78,  pulse 77, temperature 37 C, temperature source Oral, resp. rate 18, height 5\' 8"  (1.727 m), weight 108.3 kg, SpO2 94 %. Neurologic: Grossly normal Dressing dry  Discharge Medications:   Allergies as of 04/14/2019      Reactions   Tape Other (See Comments)   blisters   Sulfamethoxazole-trimethoprim Rash      Medication List    STOP taking these medications   meclizine 25 MG tablet Commonly known as: ANTIVERT   predniSONE 10 MG (21) Tbpk tablet Commonly known as: STERAPRED UNI-PAK 21 TAB     TAKE these medications   glimepiride 2 MG tablet Commonly known as: AMARYL Take 1 tablet (2 mg total) by mouth daily with breakfast.   lisinopril-hydrochlorothiazide 20-25 MG tablet Commonly known as: ZESTORETIC Take 1 tablet by mouth daily before breakfast.   meloxicam 7.5 MG tablet Commonly known as: MOBIC Take 1 tablet (7.5 mg total) by mouth 2 (two) times daily.   metFORMIN 500 MG tablet Commonly known as: GLUCOPHAGE Take 1 tablet (500 mg total) by mouth 2 (two) times daily with a meal.   methocarbamol 500 MG tablet Commonly known as: ROBAXIN Take 1 tablet (500 mg total) by mouth every 6 (six) hours as needed for muscle spasms.   metoprolol tartrate 100 MG tablet Commonly known as: LOPRESSOR Take 1 tablet (100 mg total) by mouth 2 (two) times daily.   OneTouch Verio test strip Generic drug: glucose blood Check glucose twice daily   oxyCODONE-acetaminophen 5-325 MG tablet Commonly known as: PERCOCET/ROXICET Take 1 tablet by mouth every 4 (four) hours as needed. What changed: reasons to take this   pravastatin 40 MG tablet Commonly known as: PRAVACHOL Take 1 tablet (40 mg total) by mouth at bedtime.       Disposition: home   Final Dx: ACDF C5-6 C6-7  Discharge Instructions     Remove dressing in 72 hours   Complete by: As directed    Call MD for:  difficulty breathing, headache or visual disturbances   Complete by: As directed    Call MD for:  persistant  nausea and vomiting   Complete by: As directed    Call MD for:  redness, tenderness, or signs of infection (pain, swelling, redness, odor or green/yellow discharge around incision site)   Complete by: As directed    Call MD for:  severe uncontrolled pain   Complete by: As directed    Call MD for:  temperature >100.4   Complete by: As directed    Diet - low sodium heart healthy   Complete by: As directed    Increase activity slowly   Complete by: As directed          Signed: Eustace Moore 04/14/2019, 7:53 AM

## 2019-04-14 NOTE — Plan of Care (Signed)
Pt doing well. Pt given D/C instructions with verbal understanding. Rx's were sent to pharmacy by MD. Pt's incision is clean and dry with no sign of infection. Pt's IV was removed prior to D/C. Pt D/C'd home via wheelchair per MD order. Pt is stable @ D/C and has no other needs at this time. Gearald Stonebraker, RN  

## 2019-04-14 NOTE — Discharge Instructions (Signed)

## 2019-04-17 ENCOUNTER — Other Ambulatory Visit: Payer: Self-pay | Admitting: Family Medicine

## 2019-05-26 ENCOUNTER — Other Ambulatory Visit: Payer: Self-pay | Admitting: Family Medicine

## 2019-05-26 NOTE — Telephone Encounter (Signed)
Stacks. NTBS 30 days given 04/18/19

## 2019-05-26 NOTE — Telephone Encounter (Signed)
Left detailed message.   

## 2019-05-27 ENCOUNTER — Telehealth: Payer: Self-pay | Admitting: Family Medicine

## 2019-05-27 MED ORDER — METOPROLOL TARTRATE 100 MG PO TABS: 100.0000 mg | ORAL_TABLET | Freq: Two times a day (BID) | ORAL | 0 refills | Status: DC

## 2019-05-27 MED ORDER — LISINOPRIL-HYDROCHLOROTHIAZIDE 20-25 MG PO TABS: 1.0000 | ORAL_TABLET | Freq: Every day | ORAL | 0 refills | Status: DC

## 2019-05-27 NOTE — Telephone Encounter (Signed)
Appointment scheduled 05/31/19 at 7:55 am with Dr. Livia Snellen.

## 2019-05-27 NOTE — Telephone Encounter (Signed)
Patient aware rx sent to pharmacy.  

## 2019-05-27 NOTE — Telephone Encounter (Signed)
What is the name of the medication?  metoprolol tartrate (Adam Sutton) 100 MG tablet lisinopril-hydrochlorothiazide (Adam Sutton) 20-25 MG tablet  Have you contacted your pharmacy to request a refill? No pt is out and has apt with Dr Livia Snellen next Tuesday  Which pharmacy would you like this sent to? [facility]    Patient notified that their request is being sent to the clinical staff for review and that they should receive a call once it is complete. If they do not receive a call within 24 hours they can check with their pharmacy or our office.

## 2019-05-30 ENCOUNTER — Other Ambulatory Visit: Payer: Self-pay

## 2019-05-31 ENCOUNTER — Encounter: Payer: Self-pay | Admitting: Family Medicine

## 2019-05-31 ENCOUNTER — Ambulatory Visit (INDEPENDENT_AMBULATORY_CARE_PROVIDER_SITE_OTHER): Payer: Medicare Other | Admitting: Family Medicine

## 2019-05-31 VITALS — BP 144/86 | HR 57 | Temp 96.9°F | Resp 20 | Ht 68.0 in | Wt 247.0 lb

## 2019-05-31 DIAGNOSIS — E119 Type 2 diabetes mellitus without complications: Secondary | ICD-10-CM | POA: Diagnosis not present

## 2019-05-31 DIAGNOSIS — C61 Malignant neoplasm of prostate: Secondary | ICD-10-CM | POA: Diagnosis not present

## 2019-05-31 DIAGNOSIS — E66812 Obesity, class 2: Secondary | ICD-10-CM

## 2019-05-31 DIAGNOSIS — I10 Essential (primary) hypertension: Secondary | ICD-10-CM | POA: Diagnosis not present

## 2019-05-31 DIAGNOSIS — Z6837 Body mass index (BMI) 37.0-37.9, adult: Secondary | ICD-10-CM

## 2019-05-31 LAB — BAYER DCA HB A1C WAIVED: HB A1C (BAYER DCA - WAIVED): 6.8 % (ref <7.0)

## 2019-05-31 MED ORDER — AMLODIPINE BESYLATE 5 MG PO TABS: 5.0000 mg | ORAL_TABLET | Freq: Every day | ORAL | 5 refills | Status: DC

## 2019-05-31 NOTE — Progress Notes (Addendum)
Subjective:  Patient ID: Adam Sutton,  male    DOB: 01/19/1956  Age: 63 y.o.    CC: Medical Management of Chronic Issues (3- 53m o ), Diabetes, and Hypertension   HPI Tyreik Trayer presents for  follow-up of hypertension. BP up to 99991111 systolic recently at hospital for cervical spine surgery. Patient has no history of headache chest pain or shortness of breath or recent cough. Patient also denies symptoms of TIA such as numbness weakness lateralizing. Patient denies side effects from medication. States taking it regularly.  Patient also  in for follow-up of elevated cholesterol. Doing well without complaints on current medication. Denies side effects  including myalgia and arthralgia and nausea. Also in today for liver function testing. Currently no chest pain, shortness of breath or other cardiovascular related symptoms noted.  Follow-up of diabetes. Patient does NOT check blood sugar at home.  Patient denies symptoms such as excessive hunger or urinary frequency, excessive hunger, nausea No significant hypoglycemic spells noted. Medications reviewed. Pt reports taking them regularly. Pt. denies complication/adverse reaction today.    History Dimitri has a past medical history of Cancer [facility]), Cervical disc disorder with radiculopathy, Cervical stenosis of spine, Diabetes mellitus without complication ([place]), Hypertension, Incontinence, Lipidemia, Nerve pain due to spinal stenosis, Sleep apnea (10/14/11), and Vertigo.   He has a past surgical history that includes Umbilical hernia repair; bilateral jaw surgery; Robot assisted laparoscopic radical prostatectomy (10/22/2011); Prostate surgery; Vasectomy; Tonsillectomy; and Anterior cervical decomp/discectomy fusion (N/A, 04/13/2019).   His family history includes Cancer in his father, maternal grandmother, and mother; Diabetes in his father; Heart disease in his father; Hyperlipidemia in his father; Hypertension in his father.He reports  that he has never smoked. He has never used smokeless tobacco. He reports current alcohol use. He reports that he does not use drugs.  Current Outpatient Medications on File Prior to Visit  Medication Sig Dispense Refill  . glimepiride (AMARYL) 2 MG tablet Take 1 tablet (2 mg total) by mouth daily with breakfast. 90 tablet 1  . lisinopril-hydrochlorothiazide (ZESTORETIC) 20-25 MG tablet Take 1 tablet by mouth daily. (Needs to be seen before next refill) 30 tablet 0  . meloxicam (MOBIC) 7.5 MG tablet Take 1 tablet (7.5 mg total) by mouth 2 (two) times daily. 10 tablet 0  . metFORMIN (GLUCOPHAGE) 500 MG tablet Take 1 tablet (500 mg total) by mouth 2 (two) times daily with a meal. 180 tablet 1  . methocarbamol (ROBAXIN) 500 MG tablet Take 1 tablet (500 mg total) by mouth every 6 (six) hours as needed for muscle spasms. 60 tablet 1  . metoprolol tartrate (LOPRESSOR) 100 MG tablet Take 1 tablet (100 mg total) by mouth 2 (two) times daily. (Needs to be seen before next refill) 60 tablet 0  . ONETOUCH VERIO test strip Check glucose twice daily 200 each 11  . oxyCODONE-acetaminophen (PERCOCET/ROXICET) 5-325 MG tablet Take 1 tablet by mouth every 4 (four) hours as needed. 30 tablet 0  . pravastatin (PRAVACHOL) 40 MG tablet Take 1 tablet (40 mg total) by mouth at bedtime. 90 tablet 1   No current facility-administered medications on file prior to visit.    ROS Review of Systems  Constitutional: Negative.   HENT: Negative.   Eyes: Negative for visual disturbance.  Respiratory: Negative for cough and shortness of breath.   Cardiovascular: Negative for chest pain and leg swelling.  Gastrointestinal: Negative for abdominal pain, diarrhea, nausea and vomiting.  Genitourinary: Negative for difficulty urinating.  Musculoskeletal: Negative  for arthralgias and myalgias.  Skin: Negative for rash.  Neurological: Negative for headaches.  Psychiatric/Behavioral: Negative for sleep disturbance.    Objective:   BP (!) 144/86 (BP Location: Right Arm, Cuff Size: Large)   Pulse (!) 57   Temp (!) 96.9 F (36.1 C)   Resp 20   Ht 5\' 8"  (1.727 m)   Wt 247 lb (112 kg)   SpO2 99%   BMI 37.56 kg/m   BP Readings from Last 3 Encounters:  05/31/19 (!) 144/86  04/14/19 123/78  04/11/19 140/90    Wt Readings from Last 3 Encounters:  05/31/19 247 lb (112 kg)  04/13/19 238 lb 12.8 oz (108.3 kg)  04/11/19 238 lb 12.8 oz (108.3 kg)     Physical Exam Constitutional:      General: He is not in acute distress.    Appearance: He is well-developed.  HENT:     Head: Normocephalic and atraumatic.     Right Ear: External ear normal.     Left Ear: External ear normal.     Nose: Nose normal.  Eyes:     Conjunctiva/sclera: Conjunctivae normal.     Pupils: Pupils are equal, round, and reactive to light.  Cardiovascular:     Rate and Rhythm: Normal rate and regular rhythm.     Heart sounds: Normal heart sounds. No murmur.  Pulmonary:     Effort: Pulmonary effort is normal. No respiratory distress.     Breath sounds: Normal breath sounds. No wheezing or rales.  Abdominal:     Palpations: Abdomen is soft.     Tenderness: There is no abdominal tenderness.  Musculoskeletal:        General: Normal range of motion.     Cervical back: Normal range of motion and neck supple.  Skin:    General: Skin is warm and dry.  Neurological:     Mental Status: He is alert and oriented to person, place, and time.     Deep Tendon Reflexes: Reflexes are normal and symmetric.  Psychiatric:        Behavior: Behavior normal.        Thought Content: Thought content normal.        Judgment: Judgment normal.     Diabetic Foot Exam - Simple   No data filed        Assessment & Plan:   Jaymen was seen today for medical management of chronic issues, diabetes and hypertension.  Diagnoses and all orders for this visit:  Diabetes mellitus without complication ([place]) -     CBC -     CMP -     Lipid -     Cancel:  A1c -     Bayer DCA Hb A1c Waived  Class 2 severe obesity due to excess calories with serious comorbidity and body mass index (BMI) of 37.0 to 37.9 in adult (HCC) -     CBC -     Lipid -     Cancel: A1c  Essential hypertension -     CBC -     CMP  Prostate cancer (HCC) -     PSA Total (Reflex To Free)  Other orders -     amLODipine (NORVASC) 5 MG tablet; Take 1 tablet (5 mg total) by mouth daily. For blood pressure   I am having Jeral Fruit start on amLODipine. I am also having him maintain his OneTouch Verio, glimepiride, metFORMIN, pravastatin, oxyCODONE-acetaminophen, methocarbamol, meloxicam, metoprolol tartrate, and lisinopril-hydrochlorothiazide.  Meds ordered  this encounter  Medications  . amLODipine (NORVASC) 5 MG tablet    Sig: Take 1 tablet (5 mg total) by mouth daily. For blood pressure    Dispense:  30 tablet    Refill:  5     Follow-up: Return in about 3 months (around 08/29/2019).  Claretta Fraise, M.D.

## 2019-05-31 NOTE — Addendum Note (Signed)
Addended by: Zannie Cove on: 05/31/2019 08:47 AM   Modules accepted: Orders

## 2019-05-31 NOTE — Addendum Note (Signed)
Addended by: Liliane Bade on: 05/31/2019 08:44 AM   Modules accepted: Orders

## 2019-05-31 NOTE — Addendum Note (Signed)
Addended by: Claretta Fraise on: 05/31/2019 09:58 AM   Modules accepted: Orders

## 2019-06-01 LAB — CBC WITH DIFFERENTIAL/PLATELET
Basophils Absolute: 0.1 10*3/uL (ref 0.0–0.2)
Basos: 1 %
EOS (ABSOLUTE): 0.2 10*3/uL (ref 0.0–0.4)
Eos: 2 %
Hematocrit: 45.8 % (ref 37.5–51.0)
Hemoglobin: 14.9 g/dL (ref 13.0–17.7)
Immature Grans (Abs): 0.1 10*3/uL (ref 0.0–0.1)
Immature Granulocytes: 1 %
Lymphocytes Absolute: 2.7 10*3/uL (ref 0.7–3.1)
Lymphs: 27 %
MCH: 26.9 pg (ref 26.6–33.0)
MCHC: 32.5 g/dL (ref 31.5–35.7)
MCV: 83 fL (ref 79–97)
Monocytes Absolute: 1 10*3/uL — ABNORMAL HIGH (ref 0.1–0.9)
Monocytes: 10 %
Neutrophils Absolute: 6.2 10*3/uL (ref 1.4–7.0)
Neutrophils: 59 %
Platelets: 290 10*3/uL (ref 150–450)
RBC: 5.53 x10E6/uL (ref 4.14–5.80)
RDW: 14.7 % (ref 11.6–15.4)
WBC: 10.1 10*3/uL (ref 3.4–10.8)

## 2019-06-01 LAB — CMP14+EGFR
ALT: 15 IU/L (ref 0–44)
AST: 17 IU/L (ref 0–40)
Albumin/Globulin Ratio: 1.7 (ref 1.2–2.2)
Albumin: 4.2 g/dL (ref 3.8–4.8)
Alkaline Phosphatase: 118 IU/L — ABNORMAL HIGH (ref 39–117)
BUN/Creatinine Ratio: 19 (ref 10–24)
BUN: 19 mg/dL (ref 8–27)
Bilirubin Total: 0.6 mg/dL (ref 0.0–1.2)
CO2: 27 mmol/L (ref 20–29)
Calcium: 9.3 mg/dL (ref 8.6–10.2)
Chloride: 99 mmol/L (ref 96–106)
Creatinine, Ser: 0.98 mg/dL (ref 0.76–1.27)
GFR calc Af Amer: 93 mL/min/{1.73_m2} (ref >59)
GFR calc non Af Amer: 81 mL/min/{1.73_m2} (ref >59)
Globulin, Total: 2.5 g/dL (ref 1.5–4.5)
Glucose: 127 mg/dL — ABNORMAL HIGH (ref 65–99)
Potassium: 4.2 mmol/L (ref 3.5–5.2)
Sodium: 143 mmol/L (ref 134–144)
Total Protein: 6.7 g/dL (ref 6.0–8.5)

## 2019-06-01 LAB — LIPID PANEL
Chol/HDL Ratio: 4 ratio (ref 0.0–5.0)
Cholesterol, Total: 169 mg/dL (ref 100–199)
HDL: 42 mg/dL (ref >39)
LDL Chol Calc (NIH): 75 mg/dL (ref 0–99)
Triglycerides: 322 mg/dL — ABNORMAL HIGH (ref 0–149)
VLDL Cholesterol Cal: 52 mg/dL — ABNORMAL HIGH (ref 5–40)

## 2019-06-01 LAB — PSA TOTAL (REFLEX TO FREE): Prostate Specific Ag, Serum: <0.1 ng/mL (ref 0.0–4.0)

## 2019-06-09 ENCOUNTER — Other Ambulatory Visit: Payer: Self-pay | Admitting: Family Medicine

## 2019-06-30 ENCOUNTER — Other Ambulatory Visit: Payer: Self-pay | Admitting: Family Medicine

## 2019-07-02 ENCOUNTER — Other Ambulatory Visit: Payer: Self-pay | Admitting: Family Medicine

## 2019-07-04 ENCOUNTER — Other Ambulatory Visit: Payer: Self-pay | Admitting: Family Medicine

## 2019-09-14 ENCOUNTER — Other Ambulatory Visit: Payer: Self-pay | Admitting: Family Medicine

## 2019-09-15 ENCOUNTER — Other Ambulatory Visit: Payer: Self-pay | Admitting: Family Medicine

## 2019-10-14 ENCOUNTER — Encounter: Payer: Self-pay | Admitting: *Deleted

## 2019-11-16 ENCOUNTER — Other Ambulatory Visit: Payer: Self-pay | Admitting: Family Medicine

## 2019-11-24 ENCOUNTER — Encounter: Payer: Self-pay | Admitting: Family Medicine

## 2019-11-24 ENCOUNTER — Ambulatory Visit (INDEPENDENT_AMBULATORY_CARE_PROVIDER_SITE_OTHER): Payer: Medicare Other | Admitting: Family Medicine

## 2019-11-24 ENCOUNTER — Other Ambulatory Visit: Payer: Self-pay

## 2019-11-24 VITALS — BP 137/87 | HR 83 | Temp 97.8°F | Resp 20 | Ht 68.0 in | Wt 251.0 lb

## 2019-11-24 DIAGNOSIS — I10 Essential (primary) hypertension: Secondary | ICD-10-CM

## 2019-11-24 DIAGNOSIS — N3945 Continuous leakage: Secondary | ICD-10-CM

## 2019-11-24 DIAGNOSIS — E782 Mixed hyperlipidemia: Secondary | ICD-10-CM

## 2019-11-24 DIAGNOSIS — Z0000 Encounter for general adult medical examination without abnormal findings: Secondary | ICD-10-CM

## 2019-11-24 DIAGNOSIS — E119 Type 2 diabetes mellitus without complications: Secondary | ICD-10-CM

## 2019-11-24 DIAGNOSIS — Z8546 Personal history of malignant neoplasm of prostate: Secondary | ICD-10-CM

## 2019-11-24 LAB — URINALYSIS
Bilirubin, UA: NEGATIVE
Glucose, UA: NEGATIVE
Ketones, UA: NEGATIVE
Leukocytes,UA: NEGATIVE
Nitrite, UA: NEGATIVE
Protein,UA: NEGATIVE
RBC, UA: NEGATIVE
Specific Gravity, UA: 1.025 (ref 1.005–1.030)
Urobilinogen, Ur: 0.2 mg/dL (ref 0.2–1.0)
pH, UA: 6 (ref 5.0–7.5)

## 2019-11-24 LAB — BAYER DCA HB A1C WAIVED: HB A1C (BAYER DCA - WAIVED): 6.5 % (ref <7.0)

## 2019-11-24 MED ORDER — METHOCARBAMOL 500 MG PO TABS: 500.0000 mg | ORAL_TABLET | Freq: Four times a day (QID) | ORAL | 1 refills | Status: DC | PRN

## 2019-11-24 MED ORDER — ONETOUCH VERIO VI STRP: ORAL_STRIP | 11 refills | Status: DC

## 2019-11-24 NOTE — Progress Notes (Signed)
Adam Sutton  WELCOME TO MEDICARE (IPPE) VISIT  11/25/2019  Adam Sutton DOB:1956/04/06     VOJ:[id number]  I explained that today's visit was for the purpose of health promotion and disease detection, as well as an introduction to Medicare and it's covered benefits.  I explained that no labs or other services would be performed today. If labs or other services are determined to be necessary the appropriate orders/referrals will be arranged for these to be done at a future date.  Adam Sutton is a 64 y.o. year old male primary care patient of myself. States he is getting a little dyspneic with exertion. Can take a break then keep going. .  Having total urinary incontinence since prostate surgery.   Blood glucose is always high  BP sometimes high at home    MEDICAL AND SOCIAL HISTORY Past Medical History Past Medical History:  Diagnosis Date  . Cancer [facility])    prostate cancer  . Cervical disc disorder with radiculopathy    right arm numbness  . Cervical stenosis of spine   . Diabetes mellitus without complication ([place])   . Hypertension   . Incontinence   . Lipidemia   . Nerve pain due to spinal stenosis   . Sleep apnea 10/14/11   STOP BANG SCORE 4  . Vertigo     Surgical History Past Surgical History:  Procedure Laterality Date  . ANTERIOR CERVICAL DECOMP/DISCECTOMY FUSION N/A 04/13/2019   Procedure: ANTERIOR CERVICAL DECOMPRESSION FUSION CERVICAL FIVE-SIX,CERVICAL SIX-SEVEN;  Surgeon: Eustace Moore, MD;  Location: [place];  Service: Neurosurgery;  Laterality: N/A;  anterior  . bilateral jaw surgery     40+ yrs ago- for TMJ PROBLEMS AND WISDOM TEETH IMPACTIONS  . PROSTATE SURGERY    . ROBOT ASSISTED LAPAROSCOPIC RADICAL PROSTATECTOMY  10/22/2011   Procedure: ROBOTIC ASSISTED LAPAROSCOPIC RADICAL PROSTATECTOMY;  Surgeon: Molli Hazard, MD;  Location: [facility];  Service: Urology;  Laterality: N/A;     . TONSILLECTOMY    . UMBILICAL HERNIA REPAIR    . VASECTOMY       Family History Family History  Problem Relation Age of Onset  . Cancer Mother        lung  . Cancer Father        prostate  . Diabetes Father   . Heart disease Father   . Hyperlipidemia Father   . Hypertension Father   . Cancer Maternal Grandmother        breast    Social History Social History   Socioeconomic History  . Marital status: Married    Spouse name: Not on file  . Number of children: Not on file  . Years of education: Not on file  . Highest education level: Not on file  Occupational History  . Not on file  Tobacco Use  . Smoking status: Never Smoker  . Smokeless tobacco: Never Used  Vaping Use  . Vaping Use: Never used  Substance and Sexual Activity  . Alcohol use: Yes    Comment: OCCAS BEER  . Drug use: No  . Sexual activity: Yes    Birth control/protection: Surgical  Other Topics Concern  . Not on file  Social History Narrative  . Not on file   Social Determinants of Health   Financial Resource Strain:   . Difficulty of Paying Living Expenses:   Food Insecurity:   . Worried About Charity fundraiser in the Last Year:   . [place] in the Last Year:  Transportation Needs:   . Film/video editor (Medical):   Adam Sutton Lack of Transportation (Non-Medical):   Physical Activity:   . Days of Exercise per Week:   . Minutes of Exercise per Session:   Stress:   . Feeling of Stress :   Social Connections:   . Frequency of Communication with Friends and Family:   . Frequency of Social Gatherings with Friends and Family:   . Attends Religious Services:   . Active Member of Clubs or Organizations:   . Attends Archivist Meetings:   Adam Sutton Marital Status:     Current Medications & Allergies Outpatient Encounter Medications as of 11/24/2019  Medication Sig  . amLODipine (NORVASC) 5 MG tablet Take 1 tablet (5 mg total) by mouth daily. For blood pressure  . glimepiride (AMARYL) 2 MG tablet Take 1 tablet by mouth once daily with breakfast   . lisinopril-hydrochlorothiazide (ZESTORETIC) 20-25 MG tablet Take 1 tablet by mouth daily.  . metFORMIN (GLUCOPHAGE) 500 MG tablet TAKE 1 TABLET BY MOUTH TWICE DAILY WITH MEALS  . metoprolol tartrate (LOPRESSOR) 100 MG tablet Take 1 tablet by mouth twice daily  . ONETOUCH VERIO test strip Check glucose twice daily  . [DISCONTINUED] ONETOUCH VERIO test strip Check glucose twice daily  . methocarbamol (ROBAXIN) 500 MG tablet Take 1 tablet (500 mg total) by mouth every 6 (six) hours as needed for muscle spasms.  . pravastatin (PRAVACHOL) 40 MG tablet TAKE 1 TABLET BY MOUTH AT BEDTIME (Patient not taking: Reported on 11/24/2019)  . [DISCONTINUED] meloxicam (MOBIC) 7.5 MG tablet Take 1 tablet (7.5 mg total) by mouth 2 (two) times daily. (Patient not taking: Reported on 11/24/2019)  . [DISCONTINUED] methocarbamol (ROBAXIN) 500 MG tablet Take 1 tablet (500 mg total) by mouth every 6 (six) hours as needed for muscle spasms. (Patient not taking: Reported on 11/24/2019)  . [DISCONTINUED] oxyCODONE-acetaminophen (PERCOCET/ROXICET) 5-325 MG tablet Take 1 tablet by mouth every 4 (four) hours as needed.   No facility-administered encounter medications on file as of 11/24/2019.   Tape and Sulfamethoxazole-trimethoprim  Diet & Physical Activity     Pt consumes 3 meals a day and 0 snacks a day.  The patient feels that he mostly follow a Diabetic diet.   DEPRESSION SCREENING PHQ 2/9 Scores 11/24/2019 05/31/2019 07/23/2018 07/02/2018 06/17/2018 03/29/2018 12/25/2017  PHQ - 2 Score 0 0 0 0 0 0 0     FUNCTIONAL ABILITY & LEVEL OF SAFETY Fall Risk Fall Risk  11/24/2019 05/31/2019 03/29/2018 12/25/2017 08/17/2017  Falls in the past year? 0 0 No No No  Follow up Falls evaluation completed - - - -    Activities of Daily Living In your present state of health, do you have any difficulty performing the following activities: 04/11/2019  Hearing? Y  Comment ringing of the right ear  Vision? N  Difficulty  concentrating or making decisions? N  Walking or climbing stairs? N  Dressing or bathing? N  Doing errands, shopping? N  Some recent data might be hidden    Cognitive Function       Normal Cognitive Function Screening today: Yes   EXAM (physical exam is not indicated for this visit type) Today's Vitals   11/24/19 1402  BP: 137/87  Pulse: 83  Resp: 20  Temp: 97.8 F (36.6 C)  TempSrc: Temporal  SpO2: 96%  Weight: 251 lb (113.9 kg)  Height: 5\' 8"  (1.727 m)   Body mass index is 38.16 kg/m.  Visual Acuity Screen:not upto date.  Recommended eyecare visit  Health Maintenance Health Maintenance  Topic Date Due  . OPHTHALMOLOGY EXAM  Never done  . COLONOSCOPY  Never done  . FOOT EXAM  08/18/2018  . PNA vac Low Risk Adult (1 of 2 - PCV13) 01/28/2019  . Hepatitis C Screening  05/30/2020 (Originally 12-02-1955)  . INFLUENZA VACCINE  01/15/2020  . HEMOGLOBIN A1C  05/25/2020  . TETANUS/TDAP  10/23/2027  . COVID-19 Vaccine  Completed  . HIV Screening  Completed     END OF LIFE PLANNING Advanced directives and power of attorney information specific to the patient were discussed.    Advanced Directives 04/13/2019 04/11/2019 08/22/2018 08/22/2018 10/22/2017 10/22/2011 10/14/2011  Does Patient Have a Medical Advance Directive? Yes Yes Yes Yes No Patient has advance directive, copy not in chart Patient does not have advance directive;Patient would like information  Type of Advance Directive Healthcare Power of [place];Living will Living will;Healthcare Power of [place];Living will -  Does patient want to make changes to medical advance directive? No - Guardian declined - No - Patient declined - - - -  Copy of [place] in Chart? - No - copy requested No - copy requested - - - -  Would patient like information on creating a medical advance directive? - - - - No - Patient declined -  Advance directive packet given     Education, counseling, and referrals based on the information obtained/reviewed today:    Education, counseling, and referral for other preventive services: Written checklist was completed and given to pt for obtaining, as appropriate, the other preventive services that are covered as separate Medicare Part B benefits. Possible services that were reviewed with pt are:  -annual wellness visit (AWV) -Bone mass measurements -Cardiovascular screening blood tests -Colorectal cancer screening -Counseling to prevent tobacco use -Diabetes screening tests -Diabetes self-management training (DSMT) -Glaucoma screening -HIV screening -Medical nutrition therapy -Prostate cancer screening -Seasonal influenza, pneumococcal, and Hep B vaccines -screening mammography -screening pap tests and pelvic exam -ultrasound screening for A.  Patient  Did not have an additional complaint/problem that was discussed and evaluated today.  Patient was given opportunity to ask any additional questions regarding Medicare and covered benefits.  Patient was informed that Medicare does not provide coverage for routine physical exams.  I answered all questions to the best of my ability today.    Follow up: 6 months  Claretta Fraise, MD

## 2019-11-25 ENCOUNTER — Telehealth: Payer: Self-pay | Admitting: Family Medicine

## 2019-11-25 ENCOUNTER — Encounter: Payer: Self-pay | Admitting: Family Medicine

## 2019-11-25 LAB — CMP14+EGFR
ALT: 12 IU/L (ref 0–44)
AST: 19 IU/L (ref 0–40)
Albumin/Globulin Ratio: 1.4 (ref 1.2–2.2)
Albumin: 3.9 g/dL (ref 3.8–4.8)
Alkaline Phosphatase: 90 IU/L (ref 48–121)
BUN/Creatinine Ratio: 19 (ref 10–24)
BUN: 15 mg/dL (ref 8–27)
Bilirubin Total: 0.9 mg/dL (ref 0.0–1.2)
CO2: 28 mmol/L (ref 20–29)
Calcium: 9.2 mg/dL (ref 8.6–10.2)
Chloride: 97 mmol/L (ref 96–106)
Creatinine, Ser: 0.78 mg/dL (ref 0.76–1.27)
GFR calc Af Amer: 110 mL/min/{1.73_m2} (ref >59)
GFR calc non Af Amer: 95 mL/min/{1.73_m2} (ref >59)
Globulin, Total: 2.8 g/dL (ref 1.5–4.5)
Glucose: 91 mg/dL (ref 65–99)
Potassium: 3.4 mmol/L — ABNORMAL LOW (ref 3.5–5.2)
Sodium: 140 mmol/L (ref 134–144)
Total Protein: 6.7 g/dL (ref 6.0–8.5)

## 2019-11-25 LAB — CBC WITH DIFFERENTIAL/PLATELET
Basophils Absolute: 0.1 10*3/uL (ref 0.0–0.2)
Basos: 1 %
EOS (ABSOLUTE): 0.1 10*3/uL (ref 0.0–0.4)
Eos: 1 %
Hematocrit: 43.3 % (ref 37.5–51.0)
Hemoglobin: 14.2 g/dL (ref 13.0–17.7)
Immature Grans (Abs): 0 10*3/uL (ref 0.0–0.1)
Immature Granulocytes: 0 %
Lymphocytes Absolute: 2.2 10*3/uL (ref 0.7–3.1)
Lymphs: 30 %
MCH: 26.7 pg (ref 26.6–33.0)
MCHC: 32.8 g/dL (ref 31.5–35.7)
MCV: 81 fL (ref 79–97)
Monocytes Absolute: 0.7 10*3/uL (ref 0.1–0.9)
Monocytes: 9 %
Neutrophils Absolute: 4.1 10*3/uL (ref 1.4–7.0)
Neutrophils: 59 %
Platelets: 281 10*3/uL (ref 150–450)
RBC: 5.32 x10E6/uL (ref 4.14–5.80)
RDW: 14.4 % (ref 11.6–15.4)
WBC: 7.1 10*3/uL (ref 3.4–10.8)

## 2019-11-25 LAB — MICROALBUMIN / CREATININE URINE RATIO
Creatinine, Urine: 141.8 mg/dL
Microalb/Creat Ratio: 13 mg/g creat (ref 0–29)
Microalbumin, Urine: 18.9 ug/mL

## 2019-11-25 LAB — TSH: TSH: 0.805 u[IU]/mL (ref 0.450–4.500)

## 2019-11-25 LAB — PSA TOTAL (REFLEX TO FREE): Prostate Specific Ag, Serum: <0.1 ng/mL (ref 0.0–4.0)

## 2019-11-28 ENCOUNTER — Encounter: Payer: Medicare Other | Admitting: Family Medicine

## 2019-11-28 ENCOUNTER — Telehealth: Payer: Self-pay | Admitting: Family Medicine

## 2019-12-01 ENCOUNTER — Telehealth: Payer: Self-pay | Admitting: Family Medicine

## 2019-12-01 NOTE — Telephone Encounter (Signed)
°  Prescription Request  12/01/2019  What is the name of the medication or equipment? Metoprolol Tart 100mg   Have you contacted your pharmacy to request a refill? (if applicable) Yes  Which pharmacy would you like this sent to? [facility]   Patient notified that their request is being sent to the clinical staff for review and that they should receive a response within 2 business days.   Stacks' pt.  The wrong meds got sent in for muscle spasms-Methocardam 500 mg...  Please correct bc he needs b/p meds.  Please call pt.

## 2019-12-01 NOTE — Telephone Encounter (Signed)
Rx sent on 6/2 and confirmed by pharmacy. Patient notified and verbalized understanding. Advised patient to call office if he has any further problems

## 2019-12-02 NOTE — Progress Notes (Signed)
Hello Fabian,  Your lab result is normal and/or stable.Some minor variations that are not significant are commonly marked abnormal, but do not represent any medical problem for you.  Best regards, Ezriel Boffa, M.D.

## 2019-12-02 NOTE — Progress Notes (Signed)
Hello Zymere,  Your lab result is normal and/or stable.Some minor variations that are not significant are commonly marked abnormal, but do not represent any medical problem for you.  Best regards, Bradley Handyside, M.D.

## 2019-12-21 ENCOUNTER — Other Ambulatory Visit: Payer: Self-pay | Admitting: Family Medicine

## 2020-01-09 ENCOUNTER — Telehealth: Payer: Self-pay | Admitting: Family Medicine

## 2020-01-09 NOTE — Telephone Encounter (Signed)
Reviewed pts labs and pt voiced understanding.

## 2020-03-14 IMAGING — DX DG CHEST 2V
3 series · 3 of 3 positions shown · non-contrast
Comparison: 10/14/2011 chest radiograph.

CLINICAL DATA: Vertigo, nausea, vomiting

EXAM:
CHEST - 2 VIEW

[chest lat (1 of 2)]
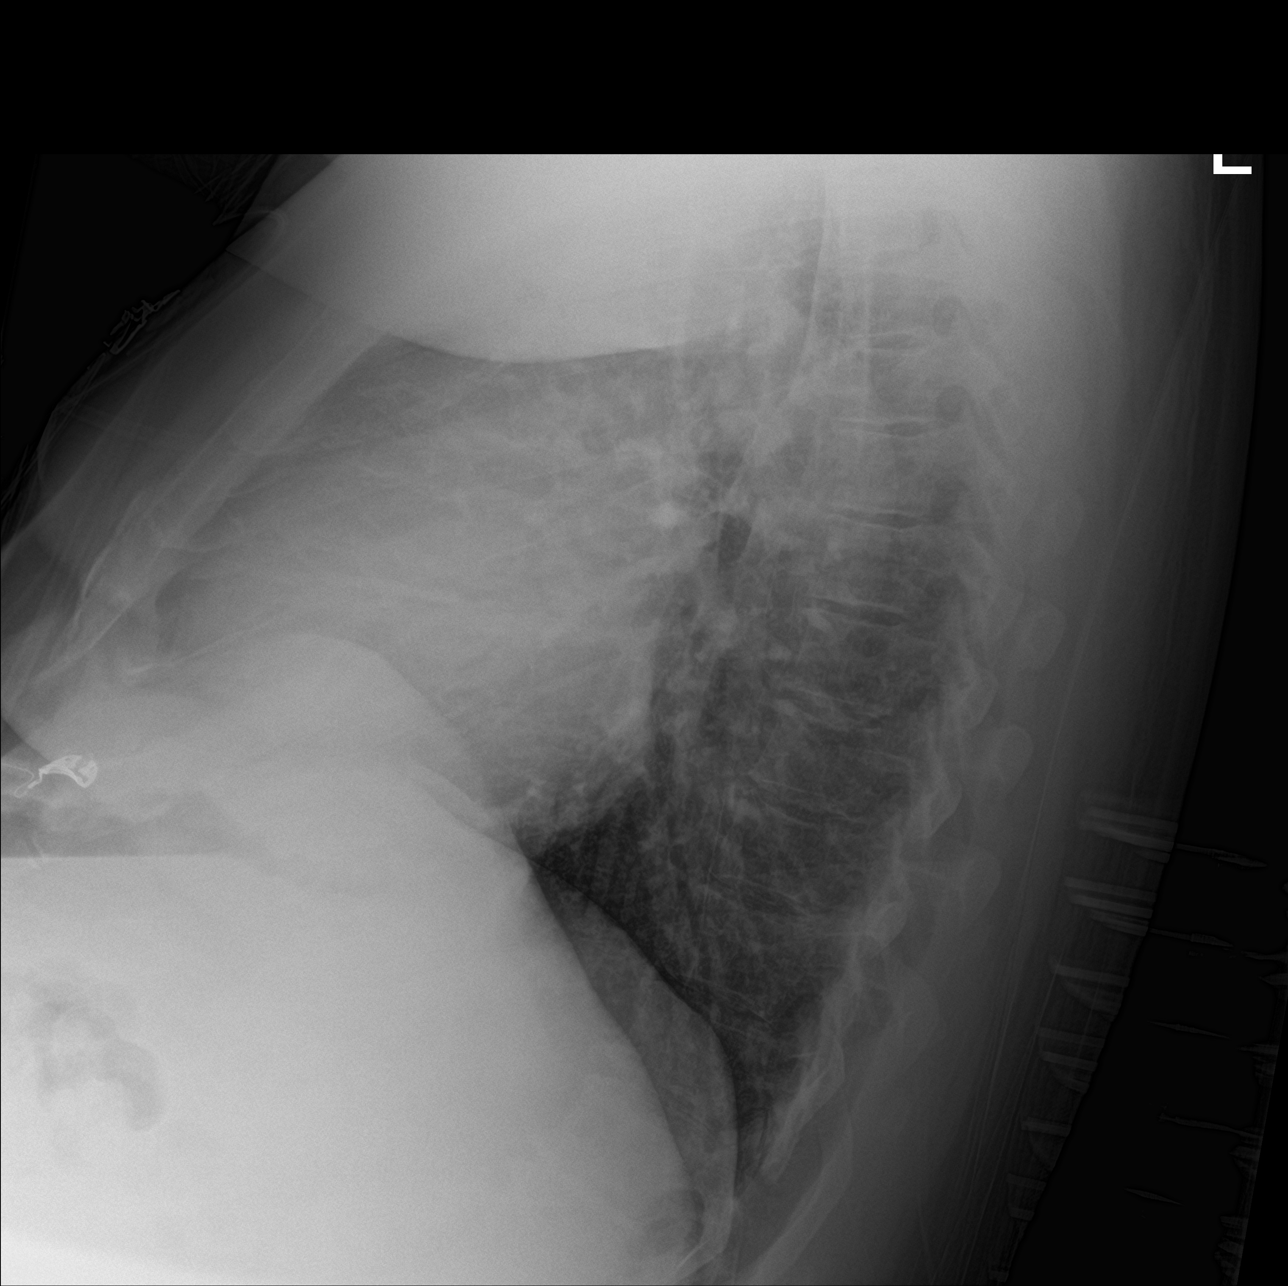

[chest ap]
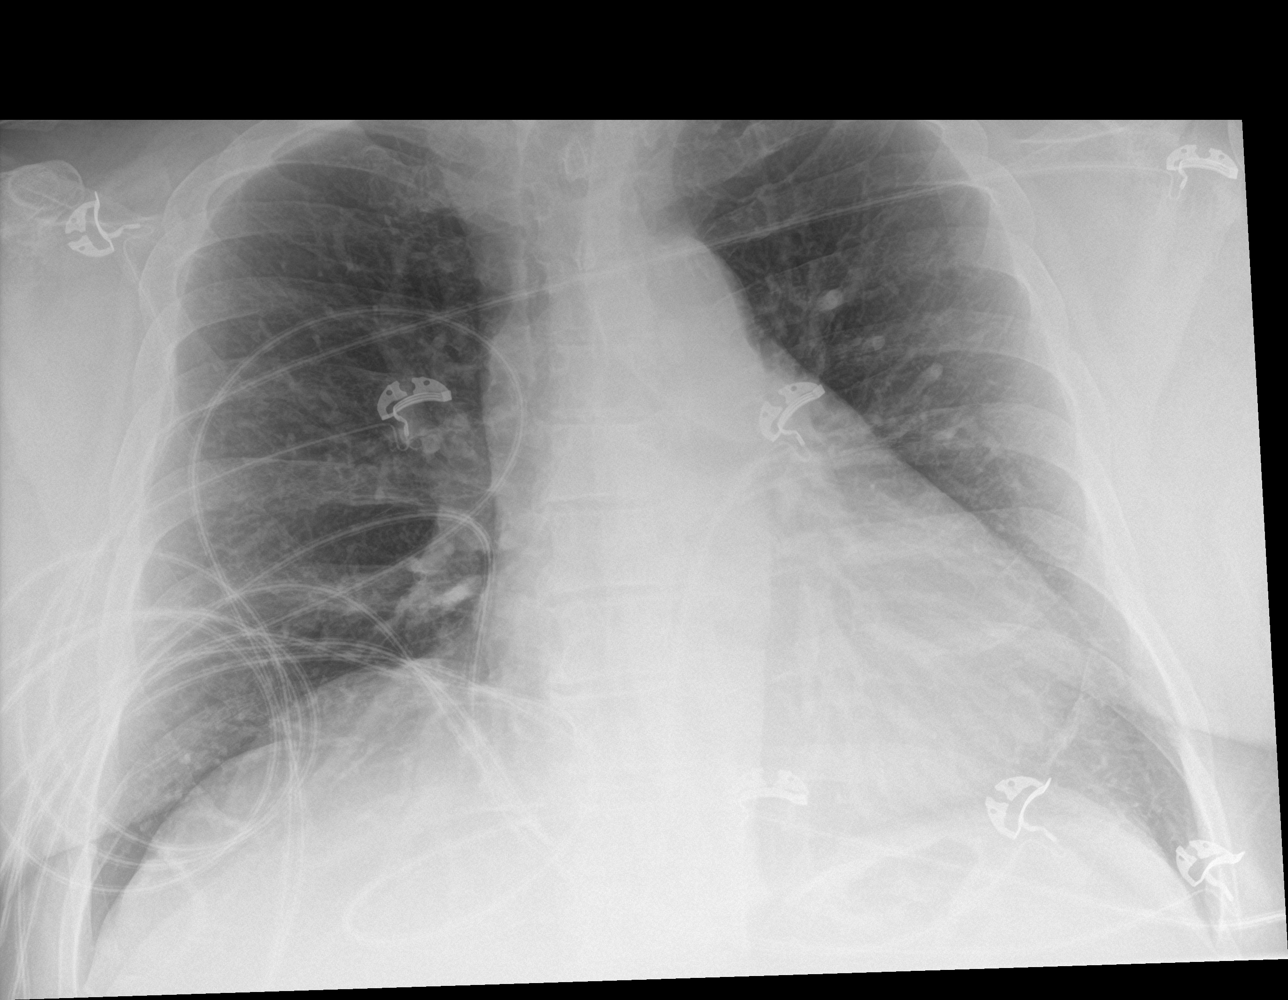

[chest lat (2 of 2)]
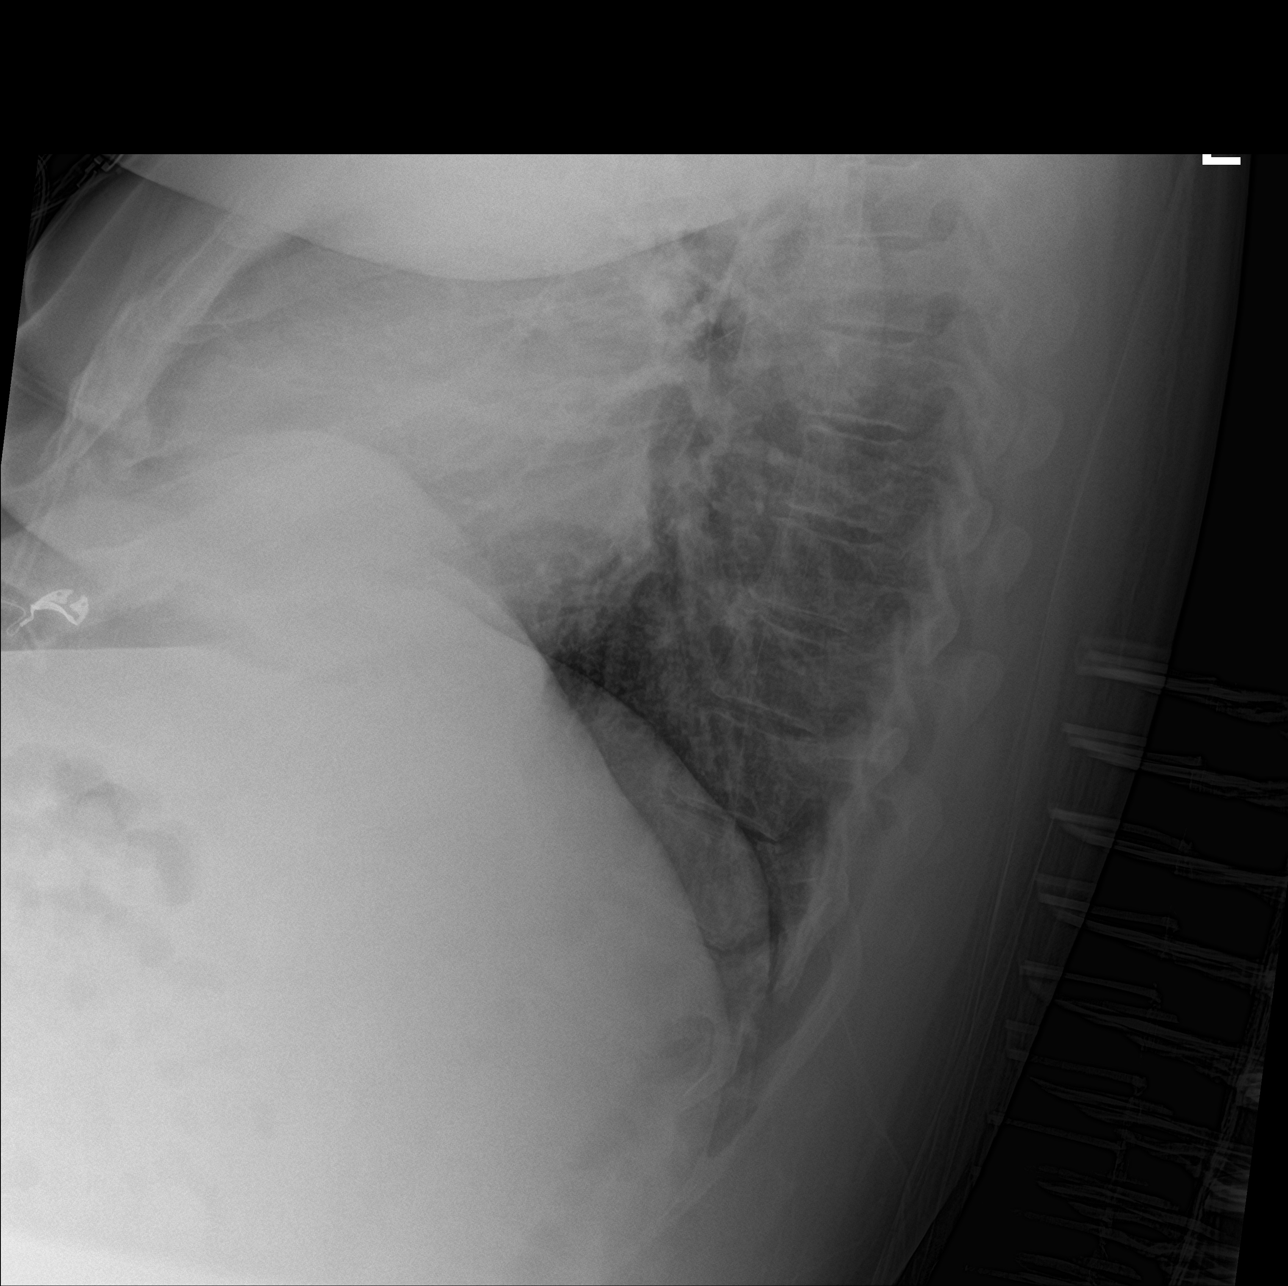

[3 of 3 positions shown; findings below may reference images not displayed]

FINDINGS: Stable cardiomediastinal silhouette with mild cardiomegaly. No
pneumothorax. No pleural effusion. Lungs appear clear, with no acute
consolidative airspace disease and no pulmonary edema.
IMPRESSION: Stable mild cardiomegaly without pulmonary edema. No active
pulmonary disease.

## 2020-03-14 IMAGING — CT CT HEAD W/O CM
3 series · 16 of 47 positions shown, 19 images · non-contrast
Comparison: None.

CLINICAL DATA: Vertigo

EXAM:
CT HEAD WITHOUT CONTRAST
TECHNIQUE: Contiguous axial images were obtained from the base of the skull
through the vertex without intravenous contrast.

[Series 2: head wo · axial · 0.45mm/px · z∈[-28,+112]mm · 10 of 34 slices shown, 13 images]
[im 3/34  brain]
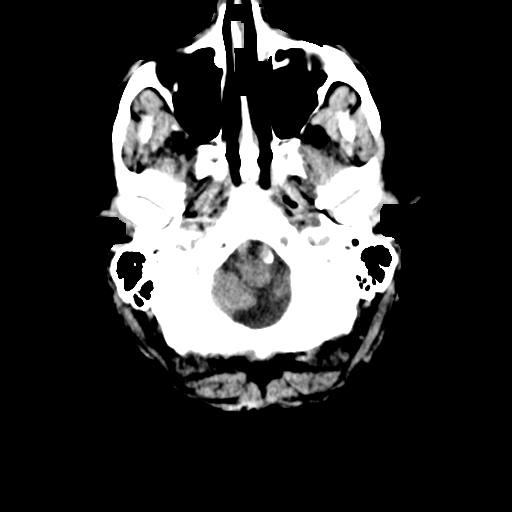
[im 3/34  bone]
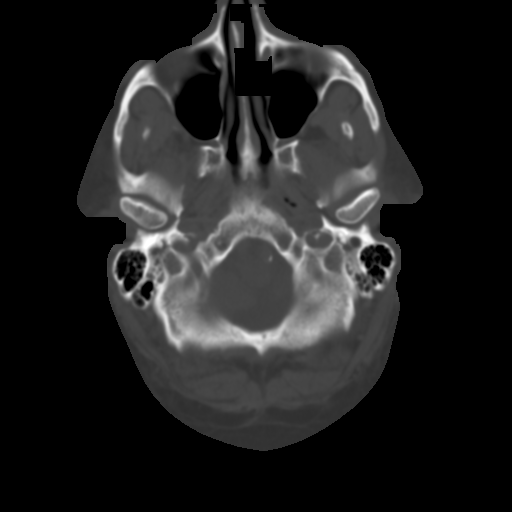
[im 6/34  brain]
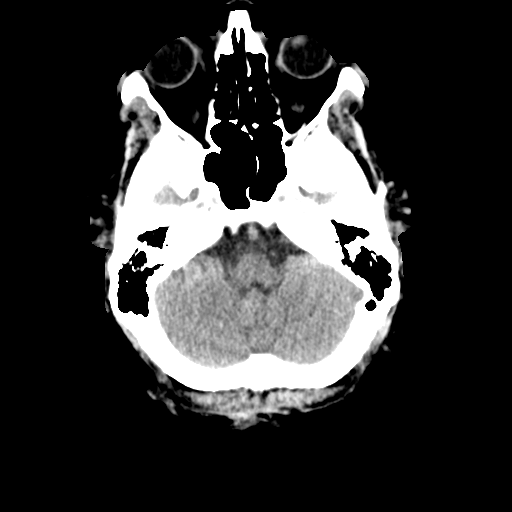
[im 10/34  brain]
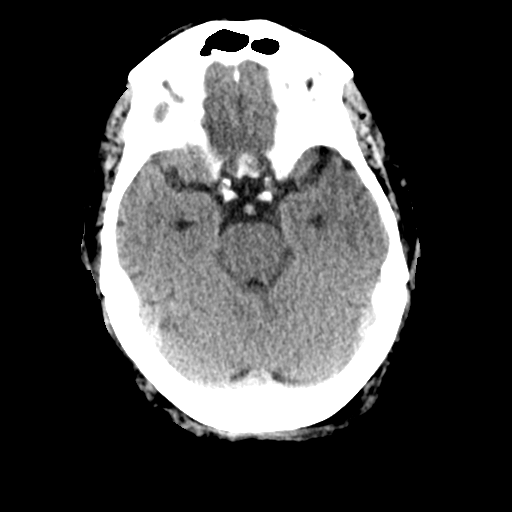
[im 12/34  brain]
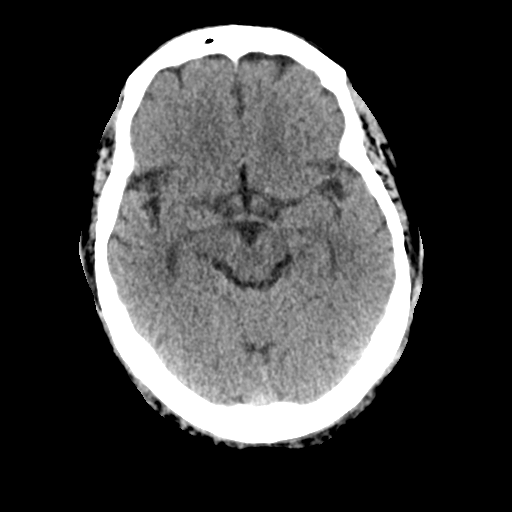
[im 15/34  brain]
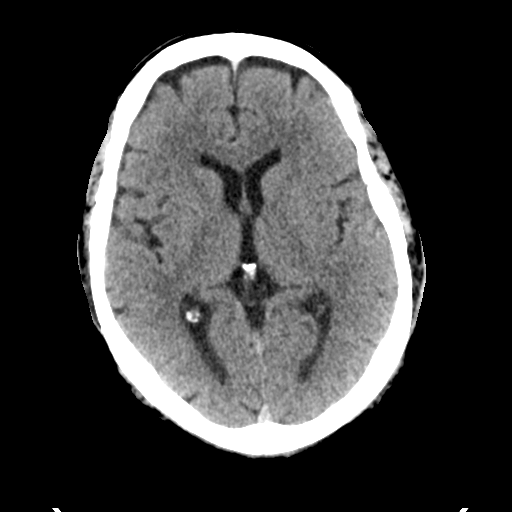
[im 15/34  bone]
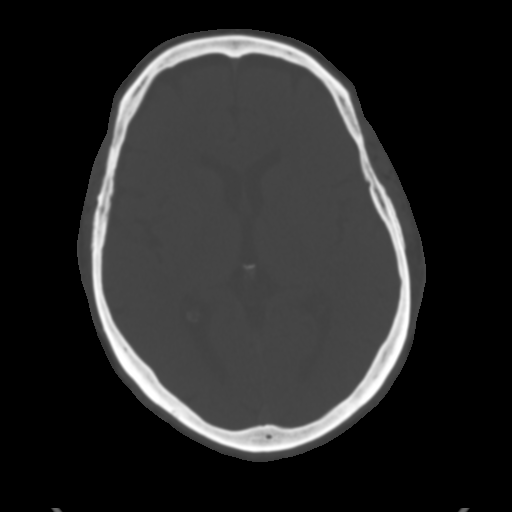
[im 19/34  brain]
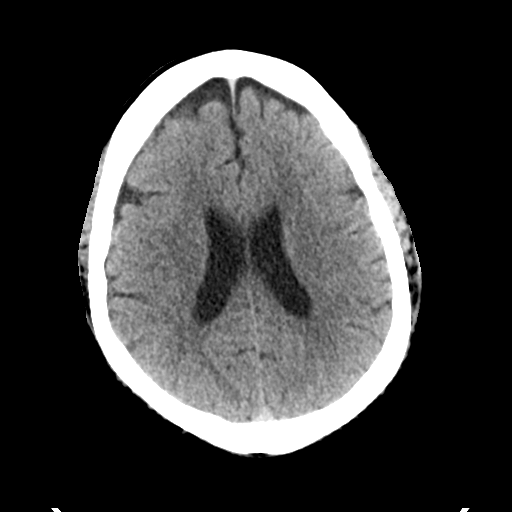
[im 22/34  brain]
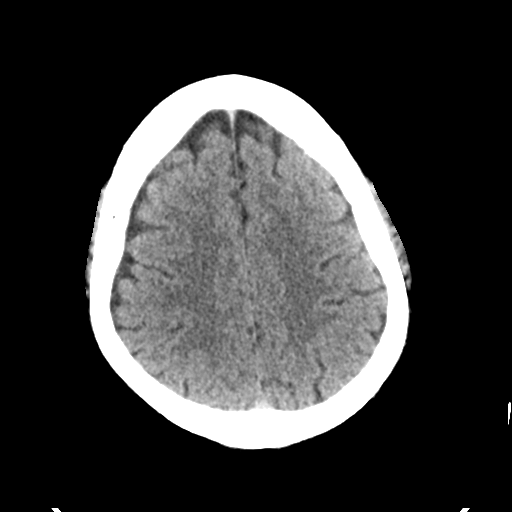
[im 26/34  brain]
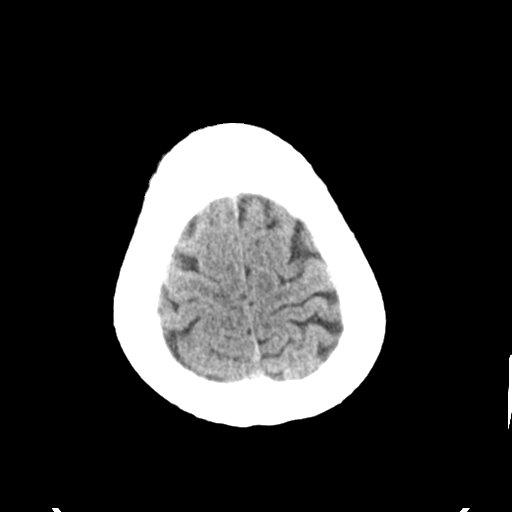
[im 28/34  brain]
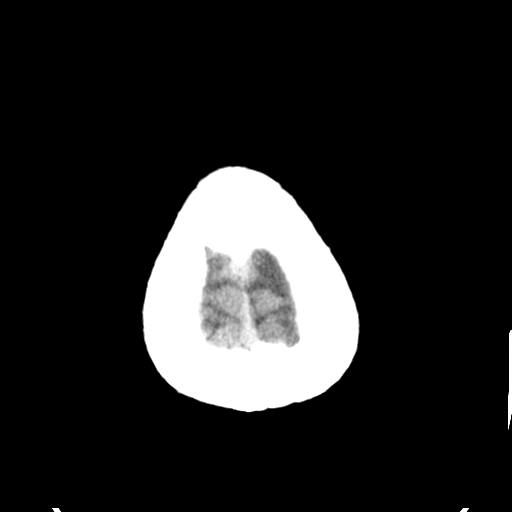
[im 28/34  bone]
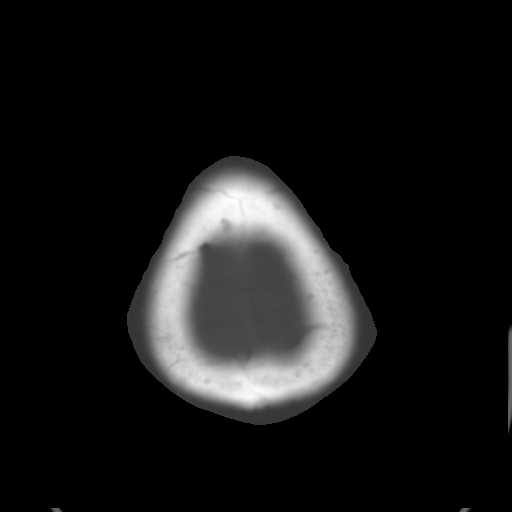
[im 31/34  brain]
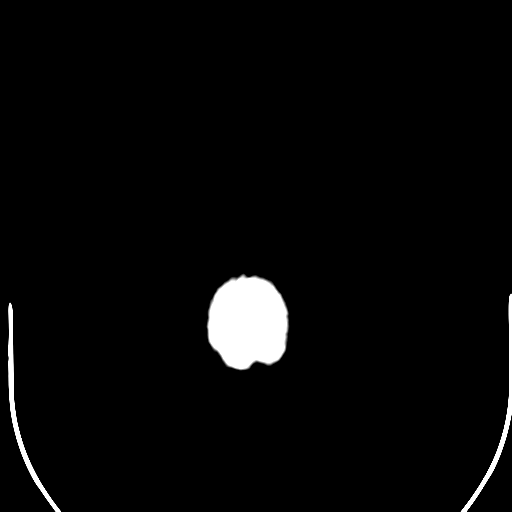

[Series 4: coronal soft tissue · coronal · 0.36mm/px · 3 of 77 slices shown]
[im 26/77  brain]
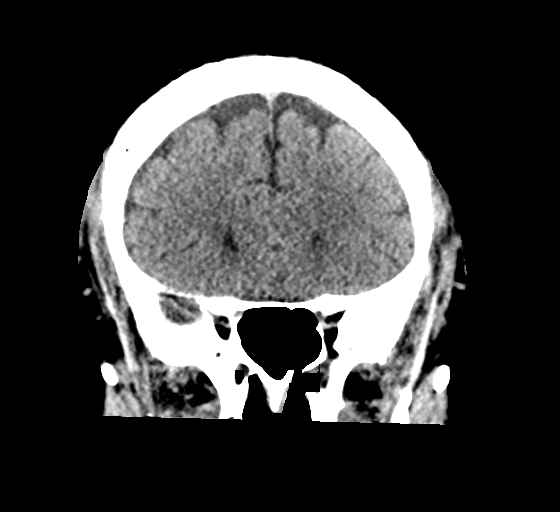
[im 34/77  brain]
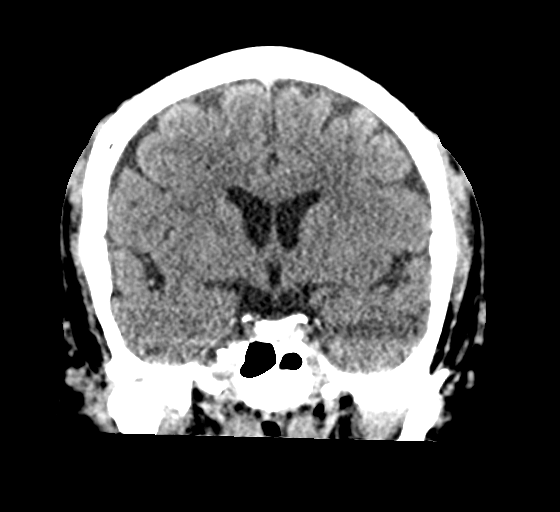
[im 43/77  brain]
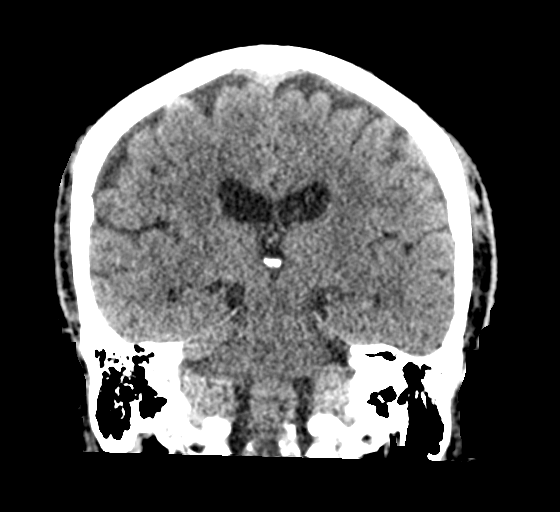

[Series 5: sagittal soft tissue · sagittal · 0.34mm/px · 3 of 67 slices shown]
[im 23/67  brain]
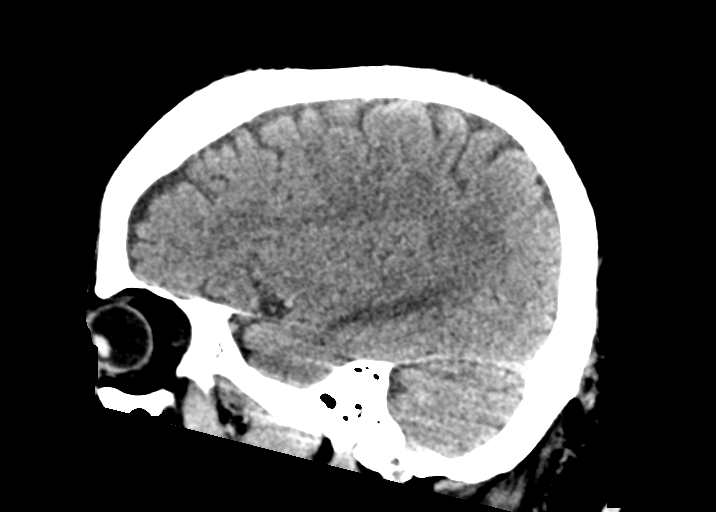
[im 34/67  brain]
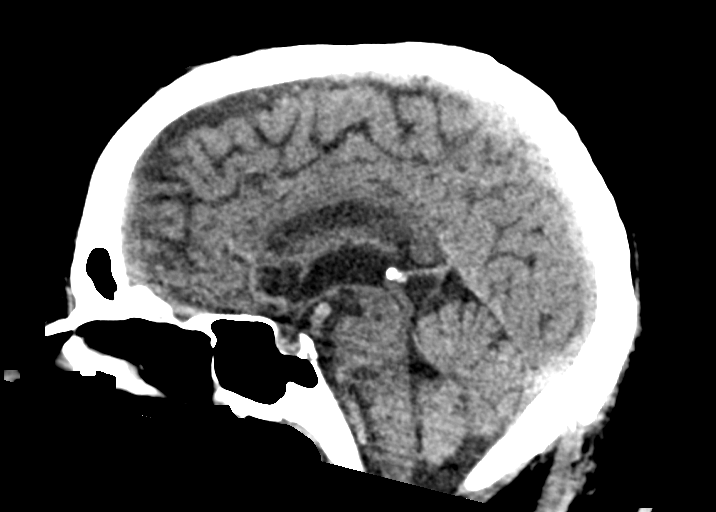
[im 45/67  brain]
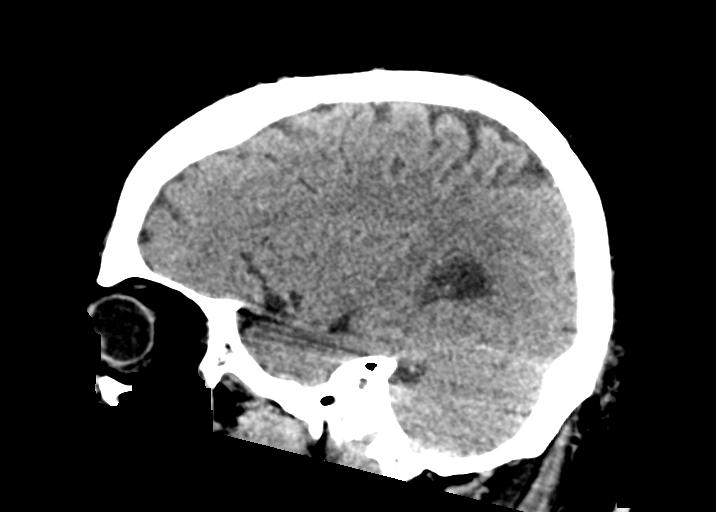

[16 of 47 positions shown; findings below may reference images not displayed]

FINDINGS: Brain: No evidence of acute infarction, hemorrhage, hydrocephalus,
extra-axial collection or mass lesion/mass effect.

Vascular: No hyperdense vessel or unexpected calcification.

Skull: Normal. Negative for fracture or focal lesion.

Sinuses/Orbits: No acute finding.

Other: None.
IMPRESSION: No acute intracranial pathology.

## 2020-04-05 ENCOUNTER — Other Ambulatory Visit: Payer: Self-pay | Admitting: Family Medicine

## 2020-07-02 ENCOUNTER — Ambulatory Visit: Payer: Medicare Other | Admitting: Family Medicine

## 2020-07-05 ENCOUNTER — Other Ambulatory Visit: Payer: Self-pay | Admitting: Family Medicine

## 2020-07-11 ENCOUNTER — Encounter: Payer: Self-pay | Admitting: Family Medicine

## 2020-07-11 ENCOUNTER — Ambulatory Visit (INDEPENDENT_AMBULATORY_CARE_PROVIDER_SITE_OTHER): Payer: 59 | Admitting: Family Medicine

## 2020-07-11 ENCOUNTER — Ambulatory Visit (INDEPENDENT_AMBULATORY_CARE_PROVIDER_SITE_OTHER): Payer: 59

## 2020-07-11 ENCOUNTER — Other Ambulatory Visit: Payer: Self-pay

## 2020-07-11 VITALS — BP 135/81 | HR 62 | Temp 97.6°F | Ht 68.0 in | Wt 255.8 lb

## 2020-07-11 DIAGNOSIS — M25552 Pain in left hip: Secondary | ICD-10-CM

## 2020-07-11 DIAGNOSIS — I10 Essential (primary) hypertension: Secondary | ICD-10-CM | POA: Diagnosis not present

## 2020-07-11 DIAGNOSIS — E782 Mixed hyperlipidemia: Secondary | ICD-10-CM | POA: Diagnosis not present

## 2020-07-11 DIAGNOSIS — Z6837 Body mass index (BMI) 37.0-37.9, adult: Secondary | ICD-10-CM

## 2020-07-11 DIAGNOSIS — Z8546 Personal history of malignant neoplasm of prostate: Secondary | ICD-10-CM | POA: Diagnosis not present

## 2020-07-11 DIAGNOSIS — E119 Type 2 diabetes mellitus without complications: Secondary | ICD-10-CM

## 2020-07-11 LAB — BAYER DCA HB A1C WAIVED: HB A1C (BAYER DCA - WAIVED): 8.2 % — ABNORMAL HIGH (ref <7.0)

## 2020-07-11 MED ORDER — METOPROLOL TARTRATE 100 MG PO TABS: 100.0000 mg | ORAL_TABLET | Freq: Two times a day (BID) | ORAL | 1 refills | Status: DC

## 2020-07-11 MED ORDER — METFORMIN HCL ER 750 MG PO TB24: 750.0000 mg | ORAL_TABLET | Freq: Two times a day (BID) | ORAL | 1 refills | Status: DC

## 2020-07-11 MED ORDER — LISINOPRIL-HYDROCHLOROTHIAZIDE 20-25 MG PO TABS: 1.0000 | ORAL_TABLET | Freq: Every day | ORAL | 1 refills | Status: DC

## 2020-07-11 MED ORDER — PRAVASTATIN SODIUM 40 MG PO TABS: 40.0000 mg | ORAL_TABLET | Freq: Every day | ORAL | 1 refills | Status: DC

## 2020-07-11 MED ORDER — AMLODIPINE BESYLATE 5 MG PO TABS: ORAL_TABLET | ORAL | 1 refills | Status: DC

## 2020-07-11 MED ORDER — GLIMEPIRIDE 2 MG PO TABS: ORAL_TABLET | ORAL | 1 refills | Status: DC

## 2020-07-11 NOTE — Progress Notes (Signed)
Subjective:  Patient ID: Adam Sutton,  male    DOB: December 12, 1955  Age: 65 y.o.    CC: Medical Management of Chronic Issues (Medication refill/)   HPI [name] presents for 2 weeks of pain in the posterior aspect of the left hip.  He points to the region of the posterior superior iliac spine.  He said it hurts primarily in the morning.  At first it was rather severe.  However, now it is getting better.  He describes it as an ache that is moderate now.  He denies injury.  It has affected his ability to ambulate somewhat at times.   follow-up of hypertension. Patient has no history of headache chest pain or shortness of breath or recent cough. Patient also denies symptoms of TIA such as numbness weakness lateralizing. Patient denies side effects from medication. States taking it regularly.  Patient also  in for follow-up of elevated cholesterol. Doing well without complaints on current medication. Denies side effects  including myalgia and arthralgia and nausea. Also in today for liver function testing. Currently no chest pain, shortness of breath or other cardiovascular related symptoms noted.  Follow-up of diabetes. Patient does check blood sugar at home . Readings run between 105-125 and postprandial 150-190 Patient denies symptoms such as excessive hunger or urinary frequency, excessive hunger, nausea No significant hypoglycemic spells noted. Medications reviewed. Pt reports taking them regularly. Pt. denies complication/adverse reaction today.    History Cyruss has a past medical history of Cancer [facility]), Cervical disc disorder with radiculopathy, Cervical stenosis of spine, Diabetes mellitus without complication ([place]), Hypertension, Incontinence, Lipidemia, Nerve pain due to spinal stenosis, Sleep apnea (10/14/11), and Vertigo.   He has a past surgical history that includes Umbilical hernia repair; bilateral jaw surgery; Robot assisted laparoscopic radical prostatectomy  (10/22/2011); Prostate surgery; Vasectomy; Tonsillectomy; and Anterior cervical decomp/discectomy fusion (N/A, 04/13/2019).   His family history includes Cancer in his father, maternal grandmother, and mother; Diabetes in his father; Heart disease in his father; Hyperlipidemia in his father; Hypertension in his father.He reports that he has never smoked. He has never used smokeless tobacco. He reports current alcohol use. He reports that he does not use drugs.  Current Outpatient Medications on File Prior to Visit  Medication Sig Dispense Refill  . ONETOUCH VERIO test strip Check glucose twice daily 200 each 11   No current facility-administered medications on file prior to visit.    ROS Review of Systems  Constitutional: Negative for fever.  Respiratory: Negative for shortness of breath.   Cardiovascular: Negative for chest pain.  Musculoskeletal: Negative for arthralgias.  Skin: Negative for rash.    Objective:  BP 135/81   Pulse 62   Temp 97.6 F (36.4 C) (Temporal)   Ht _0  (1.727 m)   Wt 255 lb 12.8 oz (116 kg)   BMI 38.89 kg/m   BP Readings from Last 3 Encounters:  07/11/20 135/81  11/24/19 137/87  05/31/19 (!) 144/86    Wt Readings from Last 3 Encounters:  07/11/20 255 lb 12.8 oz (116 kg)  11/24/19 251 lb (113.9 kg)  05/31/19 247 lb (112 kg)     Physical Exam Vitals reviewed.  Constitutional:      Appearance: He is well-developed and well-nourished.  HENT:     Head: Normocephalic and atraumatic.     Right Ear: Tympanic membrane and external ear normal. No decreased hearing noted.     Left Ear: Tympanic membrane and external ear normal. No decreased hearing  noted.     Mouth/Throat:     Pharynx: No oropharyngeal exudate or posterior oropharyngeal erythema.  Eyes:     Pupils: Pupils are equal, round, and reactive to light.  Cardiovascular:     Rate and Rhythm: Normal rate and regular rhythm.     Heart sounds: No murmur heard.   Pulmonary:     Effort:  No respiratory distress.     Breath sounds: Normal breath sounds.  Abdominal:     General: Bowel sounds are normal.     Palpations: Abdomen is soft. There is no mass.     Tenderness: There is no abdominal tenderness.  Musculoskeletal:        General: Tenderness (milld at PSIS) present.     Cervical back: Normal range of motion and neck supple.     Diabetic Foot Exam - Simple   Simple Foot Form Diabetic Foot exam was performed with the following findings: Yes 07/11/2020  8:20 AM  Visual Inspection No deformities, no ulcerations, no other skin breakdown bilaterally: Yes Sensation Testing Intact to touch and monofilament testing bilaterally: Yes Pulse Check Posterior Tibialis and Dorsalis pulse intact bilaterally: Yes Comments       Assessment & Plan:   Taige was seen today for medical management of chronic issues.  Diagnoses and all orders for this visit:  Essential hypertension -     CBC with Differential/Platelet -     CMP14+EGFR -     Lipid panel -     Bayer DCA Hb A1c Waived  Diabetes mellitus without complication (HCC) -     CBC with Differential/Platelet -     CMP14+EGFR -     Lipid panel -     Bayer DCA Hb A1c Waived  Mixed hyperlipidemia -     CBC with Differential/Platelet -     CMP14+EGFR -     Lipid panel -     Bayer DCA Hb A1c Waived  Personal history of prostate cancer -     CBC with Differential/Platelet -     CMP14+EGFR -     PSA, total and free  Class 2 severe obesity due to excess calories with serious comorbidity and body mass index (BMI) of 37.0 to 37.9 in adult (HCC) -     CBC with Differential/Platelet -     CMP14+EGFR -     Lipid panel -     Bayer DCA Hb A1c Waived  Acute hip pain, left -     DG HIP UNILAT W OR W/O PELVIS 2-3 VIEWS LEFT; Future  Other orders -     pravastatin (PRAVACHOL) 40 MG tablet; Take 1 tablet (40 mg total) by mouth at bedtime. -     metoprolol tartrate (LOPRESSOR) 100 MG tablet; Take 1 tablet (100 mg total)  by mouth 2 (two) times daily. -     lisinopril-hydrochlorothiazide (ZESTORETIC) 20-25 MG tablet; Take 1 tablet by mouth daily. -     glimepiride (AMARYL) 2 MG tablet; Take 1 tablet by mouth once daily with breakfast -     amLODipine (NORVASC) 5 MG tablet; Take 1 tablet by mouth once daily for blood pressure -     metFORMIN (GLUCOPHAGE-XR) 750 MG 24 hr tablet; Take 1 tablet (750 mg total) by mouth 2 (two) times daily with a meal.   I have discontinued Dick Nhem's methocarbamol and metFORMIN. I have also changed his pravastatin, metoprolol tartrate, and lisinopril-hydrochlorothiazide. Additionally, I am having him start on metFORMIN. Lastly, I am having  him maintain his OneTouch Verio, glimepiride, and amLODipine.  Meds ordered this encounter  Medications  . pravastatin (PRAVACHOL) 40 MG tablet    Sig: Take 1 tablet (40 mg total) by mouth at bedtime.    Dispense:  90 tablet    Refill:  1  . metoprolol tartrate (LOPRESSOR) 100 MG tablet    Sig: Take 1 tablet (100 mg total) by mouth 2 (two) times daily.    Dispense:  180 tablet    Refill:  1  . lisinopril-hydrochlorothiazide (ZESTORETIC) 20-25 MG tablet    Sig: Take 1 tablet by mouth daily.    Dispense:  90 tablet    Refill:  1  . glimepiride (AMARYL) 2 MG tablet    Sig: Take 1 tablet by mouth once daily with breakfast    Dispense:  90 tablet    Refill:  1  . amLODipine (NORVASC) 5 MG tablet    Sig: Take 1 tablet by mouth once daily for blood pressure    Dispense:  90 tablet    Refill:  1  . metFORMIN (GLUCOPHAGE-XR) 750 MG 24 hr tablet    Sig: Take 1 tablet (750 mg total) by mouth 2 (two) times daily with a meal.    Dispense:  180 tablet    Refill:  1     Follow-up: Return in about 3 months (around 10/09/2020) for diabetes.  Claretta Fraise, M.D.

## 2020-07-12 ENCOUNTER — Other Ambulatory Visit: Payer: Self-pay | Admitting: Family Medicine

## 2020-07-12 LAB — PSA, TOTAL AND FREE
PSA, Free: <0.01 ng/mL
Prostate Specific Ag, Serum: <0.1 ng/mL (ref 0.0–4.0)

## 2020-07-12 LAB — CMP14+EGFR
ALT: 15 IU/L (ref 0–44)
AST: 17 IU/L (ref 0–40)
Albumin/Globulin Ratio: 1.5 (ref 1.2–2.2)
Albumin: 4.2 g/dL (ref 3.8–4.8)
Alkaline Phosphatase: 127 IU/L — ABNORMAL HIGH (ref 44–121)
BUN/Creatinine Ratio: 20 (ref 10–24)
BUN: 21 mg/dL (ref 8–27)
Bilirubin Total: 0.7 mg/dL (ref 0.0–1.2)
CO2: 24 mmol/L (ref 20–29)
Calcium: 9.5 mg/dL (ref 8.6–10.2)
Chloride: 95 mmol/L — ABNORMAL LOW (ref 96–106)
Creatinine, Ser: 1.04 mg/dL (ref 0.76–1.27)
GFR calc Af Amer: 86 mL/min/{1.73_m2} (ref >59)
GFR calc non Af Amer: 74 mL/min/{1.73_m2} (ref >59)
Globulin, Total: 2.8 g/dL (ref 1.5–4.5)
Glucose: 208 mg/dL — ABNORMAL HIGH (ref 65–99)
Potassium: 4.1 mmol/L (ref 3.5–5.2)
Sodium: 136 mmol/L (ref 134–144)
Total Protein: 7 g/dL (ref 6.0–8.5)

## 2020-07-12 LAB — CBC WITH DIFFERENTIAL/PLATELET
Basophils Absolute: 0.1 10*3/uL (ref 0.0–0.2)
Basos: 1 %
EOS (ABSOLUTE): 0.2 10*3/uL (ref 0.0–0.4)
Eos: 2 %
Hematocrit: 47.9 % (ref 37.5–51.0)
Hemoglobin: 15.3 g/dL (ref 13.0–17.7)
Immature Grans (Abs): 0 10*3/uL (ref 0.0–0.1)
Immature Granulocytes: 0 %
Lymphocytes Absolute: 2.7 10*3/uL (ref 0.7–3.1)
Lymphs: 32 %
MCH: 26.3 pg — ABNORMAL LOW (ref 26.6–33.0)
MCHC: 31.9 g/dL (ref 31.5–35.7)
MCV: 82 fL (ref 79–97)
Monocytes Absolute: 0.8 10*3/uL (ref 0.1–0.9)
Monocytes: 10 %
Neutrophils Absolute: 4.5 10*3/uL (ref 1.4–7.0)
Neutrophils: 55 %
Platelets: 318 10*3/uL (ref 150–450)
RBC: 5.82 x10E6/uL — ABNORMAL HIGH (ref 4.14–5.80)
RDW: 14.3 % (ref 11.6–15.4)
WBC: 8.3 10*3/uL (ref 3.4–10.8)

## 2020-07-12 LAB — LIPID PANEL
Chol/HDL Ratio: 9.6 ratio — ABNORMAL HIGH (ref 0.0–5.0)
Cholesterol, Total: 270 mg/dL — ABNORMAL HIGH (ref 100–199)
HDL: 28 mg/dL — ABNORMAL LOW (ref >39)
Triglycerides: 824 mg/dL (ref 0–149)

## 2020-07-12 MED ORDER — ATORVASTATIN CALCIUM 40 MG PO TABS: 40.0000 mg | ORAL_TABLET | Freq: Every day | ORAL | 3 refills | Status: DC

## 2020-08-13 ENCOUNTER — Encounter: Payer: Self-pay | Admitting: *Deleted

## 2020-10-09 ENCOUNTER — Ambulatory Visit: Payer: 59 | Admitting: Family Medicine

## 2020-10-16 ENCOUNTER — Ambulatory Visit: Payer: Medicare Other | Admitting: Family Medicine

## 2020-10-16 ENCOUNTER — Encounter: Payer: Self-pay | Admitting: Family Medicine

## 2020-10-16 ENCOUNTER — Other Ambulatory Visit: Payer: Self-pay

## 2020-10-16 VITALS — BP 132/87 | HR 69 | Temp 97.7°F | Ht 68.0 in | Wt 253.6 lb

## 2020-10-16 DIAGNOSIS — E119 Type 2 diabetes mellitus without complications: Secondary | ICD-10-CM | POA: Diagnosis not present

## 2020-10-16 DIAGNOSIS — Z1211 Encounter for screening for malignant neoplasm of colon: Secondary | ICD-10-CM

## 2020-10-16 DIAGNOSIS — I10 Essential (primary) hypertension: Secondary | ICD-10-CM | POA: Diagnosis not present

## 2020-10-16 DIAGNOSIS — Z1159 Encounter for screening for other viral diseases: Secondary | ICD-10-CM

## 2020-10-16 DIAGNOSIS — E782 Mixed hyperlipidemia: Secondary | ICD-10-CM | POA: Diagnosis not present

## 2020-10-16 LAB — BAYER DCA HB A1C WAIVED: HB A1C (BAYER DCA - WAIVED): 6.7 % (ref <7.0)

## 2020-10-16 NOTE — Progress Notes (Signed)
Subjective:  Patient ID: Adam Sutton,  male    DOB: [birth year]  Age: 65 y.o.    CC: Medical Management of Chronic Issues   HPI Adam Sutton presents for  follow-up of hypertension. Patient has no history of headache chest pain or shortness of breath or recent cough. Patient also denies symptoms of TIA such as numbness weakness lateralizing. Patient denies side effects from medication. States taking it regularly.  Patient also  in for follow-up of elevated cholesterol. Doing well without complaints on current medication. Denies side effects  including myalgia and arthralgia and nausea. Also in today for liver function testing. Currently no chest pain, shortness of breath or other cardiovascular related symptoms noted.  Follow-up of diabetes. Patient does check blood sugar at home. Readings run 100-150 Patient denies symptoms such as excessive hunger or urinary frequency, excessive hunger, nausea No significant hypoglycemic spells noted. Medications reviewed. Pt reports taking them regularly. Pt. denies complication/adverse reaction today.   Not a single beer since last visit. Using a bread substitute called Kepo.  History Adam Sutton has a past medical history of Cancer Adam Sutton), Cervical disc disorder with radiculopathy, Cervical stenosis of spine, Diabetes mellitus without complication ([place]), Hypertension, Incontinence, Lipidemia, Nerve pain due to spinal stenosis, Sleep apnea (10/14/11), and Vertigo.   He has a past surgical history that includes Umbilical hernia repair; bilateral jaw surgery; Robot assisted laparoscopic radical prostatectomy (10/22/2011); Prostate surgery; Vasectomy; Tonsillectomy; and Anterior cervical decomp/discectomy fusion (N/A, 04/13/2019).   His family history includes Cancer in his father, maternal grandmother, and mother; Diabetes in his father; Heart disease in his father; Hyperlipidemia in his father; Hypertension in his father.He reports that he has never  smoked. He has never used smokeless tobacco. He reports current alcohol use. He reports that he does not use drugs.  Current Outpatient Medications on File Prior to Visit  Medication Sig Dispense Refill  . amLODipine (NORVASC) 5 MG tablet Take 1 tablet by mouth once daily for blood pressure 90 tablet 1  . atorvastatin (LIPITOR) 40 MG tablet Take 1 tablet (40 mg total) by mouth daily. For cholesterol 90 tablet 3  . glimepiride (AMARYL) 2 MG tablet Take 1 tablet by mouth once daily with breakfast 90 tablet 1  . lisinopril-hydrochlorothiazide (ZESTORETIC) 20-25 MG tablet Take 1 tablet by mouth daily. 90 tablet 1  . metFORMIN (GLUCOPHAGE-XR) 750 MG 24 hr tablet Take 1 tablet (750 mg total) by mouth 2 (two) times daily with a meal. 180 tablet 1  . metoprolol tartrate (LOPRESSOR) 100 MG tablet Take 1 tablet (100 mg total) by mouth 2 (two) times daily. 180 tablet 1  . ONETOUCH VERIO test strip Check glucose twice daily 200 each 11   No current facility-administered medications on file prior to visit.    ROS Review of Systems  Constitutional: Negative for fever.  Respiratory: Negative for shortness of breath.   Cardiovascular: Negative for chest pain.  Musculoskeletal: Negative for arthralgias.  Skin: Negative for rash.    Objective:  BP 132/87   Pulse 69   Temp 97.7 F (36.5 C)   Ht 5' 8"  (1.727 m)   Wt 253 lb 9.6 oz (115 kg)   SpO2 96%   BMI 38.56 kg/m   BP Readings from Last 3 Encounters:  10/16/20 132/87  07/11/20 135/81  11/24/19 137/87    Wt Readings from Last 3 Encounters:  10/16/20 253 lb 9.6 oz (115 kg)  07/11/20 255 lb 12.8 oz (116 kg)  11/24/19 251 lb (113.9 kg)  Physical Exam Vitals reviewed.  Constitutional:      Appearance: He is well-developed.  HENT:     Head: Normocephalic and atraumatic.     Right Ear: External ear normal.     Left Ear: External ear normal.     Mouth/Throat:     Pharynx: No oropharyngeal exudate or posterior oropharyngeal  erythema.  Eyes:     Pupils: Pupils are equal, round, and reactive to light.  Cardiovascular:     Rate and Rhythm: Normal rate and regular rhythm.     Heart sounds: No murmur heard.   Pulmonary:     Effort: No respiratory distress.     Breath sounds: Normal breath sounds.  Musculoskeletal:     Cervical back: Normal range of motion and neck supple.  Neurological:     Mental Status: He is alert and oriented to person, place, and time.     Results for orders placed or performed in visit on 10/16/20  Bayer DCA Hb A1c Waived  Result Value Ref Range   HB A1C (BAYER DCA - WAIVED) 6.7 <7.0 %       Assessment & Plan:   Adam Sutton was seen today for medical management of chronic issues.  Diagnoses and all orders for this visit:  Diabetes mellitus without complication ([place]) -     Bayer [place] Hb A1c Waived -     CMP14+EGFR -     CBC with Differential/Platelet  Mixed hyperlipidemia -     Lipid panel  Essential hypertension  Need for hepatitis C screening test -     Hepatitis C antibody  Screen for colon cancer -     Ambulatory referral to Gastroenterology   I am having Adam Sutton maintain his OneTouch Verio, metoprolol tartrate, lisinopril-hydrochlorothiazide, glimepiride, amLODipine, metFORMIN, and atorvastatin.  No orders of the defined types were placed in this encounter.    Follow-up: Return in about 3 months (around 01/16/2021).  Claretta Fraise, M.D.

## 2020-10-17 LAB — LIPID PANEL
Chol/HDL Ratio: 5.4 ratio — ABNORMAL HIGH (ref 0.0–5.0)
Cholesterol, Total: 162 mg/dL (ref 100–199)
HDL: 30 mg/dL — ABNORMAL LOW (ref >39)
LDL Chol Calc (NIH): 48 mg/dL (ref 0–99)
Triglycerides: 579 mg/dL (ref 0–149)
VLDL Cholesterol Cal: 84 mg/dL — ABNORMAL HIGH (ref 5–40)

## 2020-10-17 LAB — CBC WITH DIFFERENTIAL/PLATELET
Basophils Absolute: 0.1 10*3/uL (ref 0.0–0.2)
Basos: 1 %
EOS (ABSOLUTE): 0.1 10*3/uL (ref 0.0–0.4)
Eos: 1 %
Hematocrit: 44.7 % (ref 37.5–51.0)
Hemoglobin: 14.9 g/dL (ref 13.0–17.7)
Immature Grans (Abs): 0 10*3/uL (ref 0.0–0.1)
Immature Granulocytes: 0 %
Lymphocytes Absolute: 1.9 10*3/uL (ref 0.7–3.1)
Lymphs: 21 %
MCH: 26.9 pg (ref 26.6–33.0)
MCHC: 33.3 g/dL (ref 31.5–35.7)
MCV: 81 fL (ref 79–97)
Monocytes Absolute: 0.7 10*3/uL (ref 0.1–0.9)
Monocytes: 8 %
Neutrophils Absolute: 6.5 10*3/uL (ref 1.4–7.0)
Neutrophils: 69 %
Platelets: 321 10*3/uL (ref 150–450)
RBC: 5.53 x10E6/uL (ref 4.14–5.80)
RDW: 14.8 % (ref 11.6–15.4)
WBC: 9.3 10*3/uL (ref 3.4–10.8)

## 2020-10-17 LAB — CMP14+EGFR
ALT: 18 IU/L (ref 0–44)
AST: 17 IU/L (ref 0–40)
Albumin/Globulin Ratio: 1.6 (ref 1.2–2.2)
Albumin: 4.2 g/dL (ref 3.8–4.8)
Alkaline Phosphatase: 116 IU/L (ref 44–121)
BUN/Creatinine Ratio: 18 (ref 10–24)
BUN: 22 mg/dL (ref 8–27)
Bilirubin Total: 0.9 mg/dL (ref 0.0–1.2)
CO2: 25 mmol/L (ref 20–29)
Calcium: 10 mg/dL (ref 8.6–10.2)
Chloride: 93 mmol/L — ABNORMAL LOW (ref 96–106)
Creatinine, Ser: 1.19 mg/dL (ref 0.76–1.27)
Globulin, Total: 2.7 g/dL (ref 1.5–4.5)
Glucose: 160 mg/dL — ABNORMAL HIGH (ref 65–99)
Potassium: 4.4 mmol/L (ref 3.5–5.2)
Sodium: 140 mmol/L (ref 134–144)
Total Protein: 6.9 g/dL (ref 6.0–8.5)
eGFR: 67 mL/min/{1.73_m2} (ref >59)

## 2020-10-22 ENCOUNTER — Other Ambulatory Visit: Payer: Self-pay | Admitting: Family Medicine

## 2020-10-22 MED ORDER — FENOFIBRATE 160 MG PO TABS: 160.0000 mg | ORAL_TABLET | Freq: Every day | ORAL | 3 refills | Status: DC

## 2020-11-27 ENCOUNTER — Ambulatory Visit: Payer: Self-pay

## 2021-01-16 ENCOUNTER — Other Ambulatory Visit: Payer: Self-pay

## 2021-01-16 ENCOUNTER — Encounter: Payer: Self-pay | Admitting: Family Medicine

## 2021-01-16 ENCOUNTER — Ambulatory Visit: Payer: Medicare Other | Admitting: Family Medicine

## 2021-01-16 VITALS — BP 140/86 | HR 68 | Temp 97.9°F | Ht 68.0 in | Wt 251.6 lb

## 2021-01-16 DIAGNOSIS — E782 Mixed hyperlipidemia: Secondary | ICD-10-CM

## 2021-01-16 DIAGNOSIS — I10 Essential (primary) hypertension: Secondary | ICD-10-CM

## 2021-01-16 DIAGNOSIS — E119 Type 2 diabetes mellitus without complications: Secondary | ICD-10-CM

## 2021-01-16 LAB — BAYER DCA HB A1C WAIVED: HB A1C (BAYER DCA - WAIVED): 7.3 % — ABNORMAL HIGH (ref <7.0)

## 2021-01-16 MED ORDER — LISINOPRIL-HYDROCHLOROTHIAZIDE 20-25 MG PO TABS: 1.0000 | ORAL_TABLET | Freq: Every day | ORAL | 3 refills | Status: DC

## 2021-01-16 MED ORDER — SITAGLIPTIN PHOSPHATE 100 MG PO TABS: 100.0000 mg | ORAL_TABLET | Freq: Every day | ORAL | 2 refills | Status: DC

## 2021-01-16 MED ORDER — METFORMIN HCL ER 750 MG PO TB24: 750.0000 mg | ORAL_TABLET | Freq: Two times a day (BID) | ORAL | 3 refills | Status: DC

## 2021-01-16 MED ORDER — AMLODIPINE BESYLATE 5 MG PO TABS: ORAL_TABLET | ORAL | 3 refills | Status: DC

## 2021-01-16 MED ORDER — ONETOUCH VERIO VI STRP: ORAL_STRIP | 11 refills | Status: DC

## 2021-01-16 MED ORDER — METOPROLOL TARTRATE 100 MG PO TABS: 100.0000 mg | ORAL_TABLET | Freq: Two times a day (BID) | ORAL | 3 refills | Status: DC

## 2021-01-16 MED ORDER — ATORVASTATIN CALCIUM 40 MG PO TABS: 40.0000 mg | ORAL_TABLET | Freq: Every day | ORAL | 3 refills | Status: DC

## 2021-01-16 NOTE — Progress Notes (Signed)
Subjective:  Patient ID: Adam Sutton,  male    DOB: [birth year]  Age: 65 y.o.    CC: Medical Management of Chronic Issues   HPI Adam Sutton presents for  follow-up of hypertension. Patient has no history of headache chest pain or shortness of breath or recent cough. Patient also denies symptoms of TIA such as numbness weakness lateralizing. Patient denies side effects from medication. States taking it regularly.  Patient also  in for follow-up of elevated cholesterol. Doing well without complaints on current medication. Denies side effects  including myalgia and arthralgia and nausea. Also in today for liver function testing. Currently no chest pain, shortness of breath or other cardiovascular related symptoms noted.  Follow-up of diabetes. Patient does check blood sugar at home. Readings run between 120-140 Patient denies symptoms such as excessive hunger or urinary frequency, excessive hunger, nausea No significant hypoglycemic spells noted. Medications reviewed. Pt reports taking them regularly. Pt. denies complication/adverse reaction today.    History Adam Sutton has a past medical history of Cancer [facility]), Cervical disc disorder with radiculopathy, Cervical stenosis of spine, Diabetes mellitus without complication ([place]), Hypertension, Incontinence, Lipidemia, Nerve pain due to spinal stenosis, Sleep apnea (10/14/11), and Vertigo.   He has a past surgical history that includes Umbilical hernia repair; bilateral jaw surgery; Robot assisted laparoscopic radical prostatectomy (10/22/2011); Prostate surgery; Vasectomy; Tonsillectomy; and Anterior cervical decomp/discectomy fusion (N/A, 04/13/2019).   His family history includes Cancer in his father, maternal grandmother, and mother; Diabetes in his father; Heart disease in his father; Hyperlipidemia in his father; Hypertension in his father.He reports that he has never smoked. He has never used smokeless tobacco. He reports current  alcohol use. He reports that he does not use drugs.  Current Outpatient Medications on File Prior to Visit  Medication Sig Dispense Refill   fenofibrate 160 MG tablet Take 1 tablet (160 mg total) by mouth daily. For cholesterol and triglyceride 90 tablet 3   No current facility-administered medications on file prior to visit.    ROS Review of Systems  Constitutional:  Negative for fever.  Respiratory:  Negative for shortness of breath.   Cardiovascular:  Negative for chest pain.  Musculoskeletal:  Negative for arthralgias.  Skin:  Negative for rash.   Objective:  BP 140/86   Pulse 68   Temp 97.9 F (36.6 C)   Ht 5' 8"  (1.727 m)   Wt 251 lb 9.6 oz (114.1 kg)   SpO2 97%   BMI 38.26 kg/m   BP Readings from Last 3 Encounters:  01/16/21 140/86  10/16/20 132/87  07/11/20 135/81    Wt Readings from Last 3 Encounters:  01/16/21 251 lb 9.6 oz (114.1 kg)  10/16/20 253 lb 9.6 oz (115 kg)  07/11/20 255 lb 12.8 oz (116 kg)     Physical Exam Vitals reviewed.  Constitutional:      Appearance: He is well-developed.  HENT:     Head: Normocephalic and atraumatic.     Right Ear: External ear normal.     Left Ear: External ear normal.     Mouth/Throat:     Pharynx: No oropharyngeal exudate or posterior oropharyngeal erythema.  Eyes:     Pupils: Pupils are equal, round, and reactive to light.  Cardiovascular:     Rate and Rhythm: Normal rate and regular rhythm.     Heart sounds: No murmur heard. Pulmonary:     Effort: No respiratory distress.     Breath sounds: Normal breath sounds.  Musculoskeletal:  Cervical back: Normal range of motion and neck supple.  Neurological:     Mental Status: He is alert and oriented to person, place, and time.    Diabetic Foot Exam - Simple   No data filed       Assessment & Plan:   Adam Sutton was seen today for medical management of chronic issues.  Diagnoses and all orders for this visit:  Diabetes mellitus without complication  ([place]) -     Bayer DCA Hb A1c Waived -     CBC with Differential/Platelet  Mixed hyperlipidemia -     Lipid panel  Essential hypertension -     CMP14+EGFR  Other orders -     amLODipine (NORVASC) 5 MG tablet; Take 1 tablet by mouth once daily for blood pressure -     atorvastatin (LIPITOR) 40 MG tablet; Take 1 tablet (40 mg total) by mouth daily. For cholesterol -     lisinopril-hydrochlorothiazide (ZESTORETIC) 20-25 MG tablet; Take 1 tablet by mouth daily. -     metFORMIN (GLUCOPHAGE-XR) 750 MG 24 hr tablet; Take 1 tablet (750 mg total) by mouth 2 (two) times daily with a meal. -     metoprolol tartrate (LOPRESSOR) 100 MG tablet; Take 1 tablet (100 mg total) by mouth 2 (two) times daily. -     ONETOUCH VERIO test strip; Check glucose twice daily -     sitaGLIPtin (JANUVIA) 100 MG tablet; Take 1 tablet (100 mg total) by mouth daily.  I have discontinued Adam Sutton's glimepiride. I am also having him start on sitaGLIPtin. Additionally, I am having him maintain his fenofibrate, amLODipine, atorvastatin, lisinopril-hydrochlorothiazide, metFORMIN, metoprolol tartrate, and OneTouch Verio.  Meds ordered this encounter  Medications   amLODipine (NORVASC) 5 MG tablet    Sig: Take 1 tablet by mouth once daily for blood pressure    Dispense:  90 tablet    Refill:  3   atorvastatin (LIPITOR) 40 MG tablet    Sig: Take 1 tablet (40 mg total) by mouth daily. For cholesterol    Dispense:  90 tablet    Refill:  3   lisinopril-hydrochlorothiazide (ZESTORETIC) 20-25 MG tablet    Sig: Take 1 tablet by mouth daily.    Dispense:  90 tablet    Refill:  3   metFORMIN (GLUCOPHAGE-XR) 750 MG 24 hr tablet    Sig: Take 1 tablet (750 mg total) by mouth 2 (two) times daily with a meal.    Dispense:  180 tablet    Refill:  3   metoprolol tartrate (LOPRESSOR) 100 MG tablet    Sig: Take 1 tablet (100 mg total) by mouth 2 (two) times daily.    Dispense:  180 tablet    Refill:  3   ONETOUCH VERIO test  strip    Sig: Check glucose twice daily    Dispense:  200 each    Refill:  11    E11.9   sitaGLIPtin (JANUVIA) 100 MG tablet    Sig: Take 1 tablet (100 mg total) by mouth daily.    Dispense:  30 tablet    Refill:  2   Patient has a strong desire to reduce the number of medications he takes.  I suggested to him that if he could get his weight down to his ideal around BMI of 25 that will mean reducing his weight under 200 pounds.  He might have a chance to reduce his number medications taken daily.   Follow-up: Return in about 3  months (around 04/18/2021).  Claretta Fraise, M.D.

## 2021-01-17 ENCOUNTER — Other Ambulatory Visit: Payer: Self-pay | Admitting: Family Medicine

## 2021-01-17 ENCOUNTER — Telehealth: Payer: Self-pay | Admitting: *Deleted

## 2021-01-17 LAB — CMP14+EGFR
ALT: 15 IU/L (ref 0–44)
AST: 19 IU/L (ref 0–40)
Albumin/Globulin Ratio: 1.8 (ref 1.2–2.2)
Albumin: 4.2 g/dL (ref 3.8–4.8)
Alkaline Phosphatase: 122 IU/L — ABNORMAL HIGH (ref 44–121)
BUN/Creatinine Ratio: 18 (ref 10–24)
BUN: 17 mg/dL (ref 8–27)
Bilirubin Total: 1.2 mg/dL (ref 0.0–1.2)
CO2: 29 mmol/L (ref 20–29)
Calcium: 9 mg/dL (ref 8.6–10.2)
Chloride: 97 mmol/L (ref 96–106)
Creatinine, Ser: 0.92 mg/dL (ref 0.76–1.27)
Globulin, Total: 2.4 g/dL (ref 1.5–4.5)
Glucose: 156 mg/dL — ABNORMAL HIGH (ref 65–99)
Potassium: 3.9 mmol/L (ref 3.5–5.2)
Sodium: 140 mmol/L (ref 134–144)
Total Protein: 6.6 g/dL (ref 6.0–8.5)
eGFR: 92 mL/min/{1.73_m2} (ref >59)

## 2021-01-17 LAB — CBC WITH DIFFERENTIAL/PLATELET
Basophils Absolute: 0.1 10*3/uL (ref 0.0–0.2)
Basos: 1 %
EOS (ABSOLUTE): 0.1 10*3/uL (ref 0.0–0.4)
Eos: 1 %
Hematocrit: 44.2 % (ref 37.5–51.0)
Hemoglobin: 14.5 g/dL (ref 13.0–17.7)
Immature Grans (Abs): 0.1 10*3/uL (ref 0.0–0.1)
Immature Granulocytes: 1 %
Lymphocytes Absolute: 2 10*3/uL (ref 0.7–3.1)
Lymphs: 23 %
MCH: 26.9 pg (ref 26.6–33.0)
MCHC: 32.8 g/dL (ref 31.5–35.7)
MCV: 82 fL (ref 79–97)
Monocytes Absolute: 0.7 10*3/uL (ref 0.1–0.9)
Monocytes: 9 %
Neutrophils Absolute: 5.7 10*3/uL (ref 1.4–7.0)
Neutrophils: 65 %
Platelets: 270 10*3/uL (ref 150–450)
RBC: 5.4 x10E6/uL (ref 4.14–5.80)
RDW: 14.9 % (ref 11.6–15.4)
WBC: 8.7 10*3/uL (ref 3.4–10.8)

## 2021-01-17 LAB — LIPID PANEL
Chol/HDL Ratio: 4.8 ratio (ref 0.0–5.0)
Cholesterol, Total: 148 mg/dL (ref 100–199)
HDL: 31 mg/dL — ABNORMAL LOW (ref >39)
LDL Chol Calc (NIH): 78 mg/dL (ref 0–99)
Triglycerides: 234 mg/dL — ABNORMAL HIGH (ref 0–149)
VLDL Cholesterol Cal: 39 mg/dL (ref 5–40)

## 2021-01-17 MED ORDER — SAXAGLIPTIN HCL 5 MG PO TABS: 5.0000 mg | ORAL_TABLET | Freq: Every day | ORAL | 1 refills | Status: DC

## 2021-01-17 NOTE — Telephone Encounter (Signed)
Fax from [place]: [place] '100mg'$  Note: Insurance prefers [place], [place] or [place] Please send substitution or start a PA

## 2021-01-17 NOTE — Telephone Encounter (Signed)
I sent in onglyza

## 2021-01-18 NOTE — Progress Notes (Signed)
Hello Adam Sutton,  Your lab result is normal and/or stable.Some minor variations that are not significant are commonly marked abnormal, but do not represent any medical problem for you.  Best regards, Shaliah Wann, M.D.

## 2021-02-26 ENCOUNTER — Telehealth: Payer: Self-pay | Admitting: Family Medicine

## 2021-02-26 NOTE — Telephone Encounter (Signed)
Left message for patient to call back and schedule Medicare Annual Wellness Visit (AWV) either virtually or in office. Left both office number and my number [phone]   Last AWV ;WTM 11/24/19  please schedule at anytime with health coach  This should be a 45 minute visit.

## 2021-06-21 ENCOUNTER — Other Ambulatory Visit: Payer: Self-pay | Admitting: Family Medicine

## 2021-06-28 ENCOUNTER — Telehealth: Payer: Self-pay | Admitting: Family Medicine

## 2021-06-28 NOTE — Telephone Encounter (Signed)
Pt returning call. Please call back.

## 2021-06-28 NOTE — Telephone Encounter (Signed)
Left message to call back.  Dr. Livia Snellen discontinued Glimeperide and prescribed Januvia instead.  Insurance denied Januvia and recommended Onglyza instead which was prescribed by Dr. Livia Snellen in August.

## 2021-06-28 NOTE — Telephone Encounter (Signed)
Pt would wants to talk to a nurse to discuss his medications. He is confused on what he should be taking.  He took that last of rx glimepiride (AMARYL) 2 MG tablet--he says that [facility] does not have this rx on record, dr cancelled and sent in something else. Pt does not know that name of the new medication. He does remember that it was expensive and did not fill. Please call back

## 2021-06-28 NOTE — Telephone Encounter (Signed)
Pt is returning nurse call.

## 2021-07-01 ENCOUNTER — Other Ambulatory Visit: Payer: Self-pay | Admitting: *Deleted

## 2021-07-01 MED ORDER — SAXAGLIPTIN HCL 5 MG PO TABS: 5.0000 mg | ORAL_TABLET | Freq: Every day | ORAL | 3 refills | Status: DC

## 2021-07-01 NOTE — Telephone Encounter (Signed)
WENT OVER MEDS WITH PATIENT.

## 2021-07-02 ENCOUNTER — Other Ambulatory Visit: Payer: Self-pay | Admitting: Family Medicine

## 2021-07-02 MED ORDER — GLIMEPIRIDE 2 MG PO TABS: ORAL_TABLET | ORAL | 1 refills | Status: DC

## 2021-07-02 NOTE — Telephone Encounter (Signed)
Patient aware.

## 2021-07-02 NOTE — Telephone Encounter (Signed)
I sent in the glimepiride

## 2021-12-12 ENCOUNTER — Other Ambulatory Visit: Payer: Self-pay | Admitting: Family Medicine

## 2022-02-15 ENCOUNTER — Other Ambulatory Visit: Payer: Self-pay | Admitting: Family Medicine

## 2022-02-27 ENCOUNTER — Ambulatory Visit (INDEPENDENT_AMBULATORY_CARE_PROVIDER_SITE_OTHER): Payer: Medicare Other | Admitting: Family Medicine

## 2022-02-27 ENCOUNTER — Encounter: Payer: Self-pay | Admitting: Family Medicine

## 2022-02-27 VITALS — BP 138/81 | HR 65 | Temp 97.8°F | Ht 68.0 in | Wt 251.4 lb

## 2022-02-27 DIAGNOSIS — Z125 Encounter for screening for malignant neoplasm of prostate: Secondary | ICD-10-CM

## 2022-02-27 DIAGNOSIS — I10 Essential (primary) hypertension: Secondary | ICD-10-CM

## 2022-02-27 DIAGNOSIS — E782 Mixed hyperlipidemia: Secondary | ICD-10-CM | POA: Diagnosis not present

## 2022-02-27 DIAGNOSIS — Z23 Encounter for immunization: Secondary | ICD-10-CM | POA: Diagnosis not present

## 2022-02-27 DIAGNOSIS — E119 Type 2 diabetes mellitus without complications: Secondary | ICD-10-CM | POA: Diagnosis not present

## 2022-02-27 LAB — BAYER DCA HB A1C WAIVED: HB A1C (BAYER DCA - WAIVED): 9.6 % — ABNORMAL HIGH (ref 4.8–5.6)

## 2022-02-27 MED ORDER — FENOFIBRATE 160 MG PO TABS: 160.0000 mg | ORAL_TABLET | Freq: Every day | ORAL | 3 refills | Status: DC

## 2022-02-27 MED ORDER — ATORVASTATIN CALCIUM 40 MG PO TABS: 40.0000 mg | ORAL_TABLET | Freq: Every day | ORAL | 3 refills | Status: DC

## 2022-02-27 MED ORDER — LISINOPRIL-HYDROCHLOROTHIAZIDE 20-25 MG PO TABS: 1.0000 | ORAL_TABLET | Freq: Every day | ORAL | 3 refills | Status: DC

## 2022-02-27 MED ORDER — GLIMEPIRIDE 2 MG PO TABS: ORAL_TABLET | ORAL | 3 refills | Status: DC

## 2022-02-27 MED ORDER — METFORMIN HCL ER 750 MG PO TB24: 750.0000 mg | ORAL_TABLET | Freq: Two times a day (BID) | ORAL | 3 refills | Status: DC

## 2022-02-27 MED ORDER — METOPROLOL TARTRATE 100 MG PO TABS: 100.0000 mg | ORAL_TABLET | Freq: Two times a day (BID) | ORAL | 3 refills | Status: DC

## 2022-02-27 MED ORDER — AMLODIPINE BESYLATE 5 MG PO TABS: ORAL_TABLET | ORAL | 3 refills | Status: DC

## 2022-02-27 NOTE — Progress Notes (Signed)
Subjective:  Patient ID: Adam Sutton, male    DOB: [birth year]  Age: 66 y.o. MRN: [id number]  CC: Medical Management of Chronic Issues   HPI Katherine Syme presents for Follow-up of diabetes. 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 Admits not eating for diabetes nor is he exercising. Patient denies symptoms such as polyuria, polydipsia, excessive hunger, nausea No significant hypoglycemic spells noted. Medications reviewed. Pt reports taking them regularly without complication/adverse reaction being reported today.    History Martavis has a past medical history of Cancer [facility]), Cervical disc disorder with radiculopathy, Cervical stenosis of spine, Diabetes mellitus without complication ([place]), Hypertension, Incontinence, Lipidemia, Nerve pain due to spinal stenosis, Sleep apnea (10/14/11), and Vertigo.   He has a past surgical history that includes Umbilical hernia repair; bilateral jaw surgery; Robot assisted laparoscopic radical prostatectomy (10/22/2011); Prostate surgery; Vasectomy; Tonsillectomy; and Anterior cervical decomp/discectomy fusion (N/A, 04/13/2019).   His family history includes Cancer in his father, maternal grandmother, and mother; Diabetes in his father; Heart disease in his father; Hyperlipidemia in his father; Hypertension in his father.He reports that he has never smoked. He has never used smokeless tobacco. He reports current alcohol use. He reports that he does not use drugs.  Current Outpatient Medications on File Prior to Visit  Medication Sig Dispense Refill   ONETOUCH VERIO test strip Check glucose twice daily 200 each 11   No current facility-administered medications on file prior to visit.    ROS Review of Systems  Constitutional:  Negative for fever.  Respiratory:  Negative for shortness of breath.   Cardiovascular:  Negative for chest pain.  Musculoskeletal:  Negative for arthralgias.  Skin:   Negative for rash.    Objective:  BP 138/81   Pulse 65   Temp 97.8 F (36.6 C)   Ht 5' 8"  (1.727 m)   Wt 251 lb 6.4 oz (114 kg)   SpO2 96%   BMI 38.23 kg/m   BP Readings from Last 3 Encounters:  02/27/22 138/81  01/16/21 140/86  10/16/20 132/87    Wt Readings from Last 3 Encounters:  02/27/22 251 lb 6.4 oz (114 kg)  01/16/21 251 lb 9.6 oz (114.1 kg)  10/16/20 253 lb 9.6 oz (115 kg)     Physical Exam Vitals reviewed.  Constitutional:      Appearance: He is well-developed.  HENT:     Head: Normocephalic and atraumatic.     Right Ear: External ear normal.     Left Ear: External ear normal.     Mouth/Throat:     Pharynx: No oropharyngeal exudate or posterior oropharyngeal erythema.  Eyes:     Pupils: Pupils are equal, round, and reactive to light.  Cardiovascular:     Rate and Rhythm: Normal rate and regular rhythm.     Heart sounds: No murmur heard. Pulmonary:     Effort: No respiratory distress.     Breath sounds: Normal breath sounds.  Musculoskeletal:     Cervical back: Normal range of motion and neck supple.  Neurological:     Mental Status: He is alert and oriented to person, place, and time.     A1c=9.6 today  Assessment & Plan:   Arn was seen today for medical management of chronic issues.  Diagnoses and all orders for this visit:  Diabetes mellitus without complication ([place]) -     Bayer DCA Hb A1c Waived -     Microalbumin / creatinine  urine ratio  Mixed hyperlipidemia -     Lipid panel  Essential hypertension -     CBC with Differential/Platelet -     CMP14+EGFR  Prostate cancer screening -     PSA, total and free  Other orders -     amLODipine (NORVASC) 5 MG tablet; Take 1 tablet by mouth once daily for blood pressure -     atorvastatin (LIPITOR) 40 MG tablet; Take 1 tablet (40 mg total) by mouth daily. for cholesterol. -     fenofibrate 160 MG tablet; Take 1 tablet (160 mg total) by mouth daily. For cholesterol and  triglyceride -     glimepiride (AMARYL) 2 MG tablet; Take 1 tablet by mouth once daily with breakfast -     lisinopril-hydrochlorothiazide (ZESTORETIC) 20-25 MG tablet; Take 1 tablet by mouth daily. -     metFORMIN (GLUCOPHAGE-XR) 750 MG 24 hr tablet; Take 1 tablet (750 mg total) by mouth 2 (two) times daily with a meal. -     metoprolol tartrate (LOPRESSOR) 100 MG tablet; Take 1 tablet (100 mg total) by mouth 2 (two) times daily.      I have changed Doroteo Chahal's amLODipine, atorvastatin, glimepiride, lisinopril-hydrochlorothiazide, metFORMIN, and metoprolol tartrate. I am also having him maintain his OneTouch Verio and fenofibrate.  Meds ordered this encounter  Medications   amLODipine (NORVASC) 5 MG tablet    Sig: Take 1 tablet by mouth once daily for blood pressure    Dispense:  90 tablet    Refill:  3   atorvastatin (LIPITOR) 40 MG tablet    Sig: Take 1 tablet (40 mg total) by mouth daily. for cholesterol.    Dispense:  90 tablet    Refill:  3   fenofibrate 160 MG tablet    Sig: Take 1 tablet (160 mg total) by mouth daily. For cholesterol and triglyceride    Dispense:  90 tablet    Refill:  3   glimepiride (AMARYL) 2 MG tablet    Sig: Take 1 tablet by mouth once daily with breakfast    Dispense:  90 tablet    Refill:  3   lisinopril-hydrochlorothiazide (ZESTORETIC) 20-25 MG tablet    Sig: Take 1 tablet by mouth daily.    Dispense:  90 tablet    Refill:  3   metFORMIN (GLUCOPHAGE-XR) 750 MG 24 hr tablet    Sig: Take 1 tablet (750 mg total) by mouth 2 (two) times daily with a meal.    Dispense:  180 tablet    Refill:  3   metoprolol tartrate (LOPRESSOR) 100 MG tablet    Sig: Take 1 tablet (100 mg total) by mouth 2 (two) times daily.    Dispense:  180 tablet    Refill:  3   Patient to consider Ozempic. He is going to check on cost first. For now increase the glimepiride to 4 mg daily.  Follow-up: Return in about 3 months (around 05/29/2022).  Claretta Fraise,  M.D.

## 2022-02-28 LAB — LIPID PANEL
Chol/HDL Ratio: 4.3 ratio (ref 0.0–5.0)
Cholesterol, Total: 136 mg/dL (ref 100–199)
HDL: 32 mg/dL — ABNORMAL LOW (ref >39)
LDL Chol Calc (NIH): 59 mg/dL (ref 0–99)
Triglycerides: 282 mg/dL — ABNORMAL HIGH (ref 0–149)
VLDL Cholesterol Cal: 45 mg/dL — ABNORMAL HIGH (ref 5–40)

## 2022-02-28 LAB — CBC WITH DIFFERENTIAL/PLATELET
Basophils Absolute: 0.1 10*3/uL (ref 0.0–0.2)
Basos: 1 %
EOS (ABSOLUTE): 0.2 10*3/uL (ref 0.0–0.4)
Eos: 2 %
Hematocrit: 46.7 % (ref 37.5–51.0)
Hemoglobin: 15.1 g/dL (ref 13.0–17.7)
Immature Grans (Abs): 0 10*3/uL (ref 0.0–0.1)
Immature Granulocytes: 0 %
Lymphocytes Absolute: 2.4 10*3/uL (ref 0.7–3.1)
Lymphs: 29 %
MCH: 26.9 pg (ref 26.6–33.0)
MCHC: 32.3 g/dL (ref 31.5–35.7)
MCV: 83 fL (ref 79–97)
Monocytes Absolute: 0.8 10*3/uL (ref 0.1–0.9)
Monocytes: 9 %
Neutrophils Absolute: 4.9 10*3/uL (ref 1.4–7.0)
Neutrophils: 59 %
Platelets: 348 10*3/uL (ref 150–450)
RBC: 5.62 x10E6/uL (ref 4.14–5.80)
RDW: 14 % (ref 11.6–15.4)
WBC: 8.4 10*3/uL (ref 3.4–10.8)

## 2022-02-28 LAB — CMP14+EGFR
ALT: 24 IU/L (ref 0–44)
AST: 20 IU/L (ref 0–40)
Albumin/Globulin Ratio: 1.7 (ref 1.2–2.2)
Albumin: 4.3 g/dL (ref 3.9–4.9)
Alkaline Phosphatase: 96 IU/L (ref 44–121)
BUN/Creatinine Ratio: 18 (ref 10–24)
BUN: 20 mg/dL (ref 8–27)
Bilirubin Total: 1 mg/dL (ref 0.0–1.2)
CO2: 28 mmol/L (ref 20–29)
Calcium: 9.9 mg/dL (ref 8.6–10.2)
Chloride: 95 mmol/L — ABNORMAL LOW (ref 96–106)
Creatinine, Ser: 1.14 mg/dL (ref 0.76–1.27)
Globulin, Total: 2.5 g/dL (ref 1.5–4.5)
Glucose: 181 mg/dL — ABNORMAL HIGH (ref 70–99)
Potassium: 4.1 mmol/L (ref 3.5–5.2)
Sodium: 138 mmol/L (ref 134–144)
Total Protein: 6.8 g/dL (ref 6.0–8.5)
eGFR: 70 mL/min/{1.73_m2} (ref >59)

## 2022-02-28 LAB — PSA, TOTAL AND FREE
PSA, Free: <0.02 ng/mL
Prostate Specific Ag, Serum: <0.1 ng/mL (ref 0.0–4.0)

## 2022-02-28 LAB — MICROALBUMIN / CREATININE URINE RATIO
Creatinine, Urine: 106.5 mg/dL
Microalb/Creat Ratio: 28 mg/g creat (ref 0–29)
Microalbumin, Urine: 30.3 ug/mL

## 2022-03-03 NOTE — Progress Notes (Signed)
Hello Adam Sutton,  Your lab result is normal and/or stable.Some minor variations that are not significant are commonly marked abnormal, but do not represent any medical problem for you.  Best regards, Claretta Fraise, M.D.

## 2022-05-29 ENCOUNTER — Ambulatory Visit: Payer: Medicare Other | Admitting: Family Medicine

## 2022-07-04 ENCOUNTER — Telehealth: Payer: Medicare Other | Admitting: Family

## 2022-07-04 ENCOUNTER — Encounter: Payer: Self-pay | Admitting: Family

## 2022-07-04 DIAGNOSIS — B372 Candidiasis of skin and nail: Secondary | ICD-10-CM

## 2022-07-04 DIAGNOSIS — R32 Unspecified urinary incontinence: Secondary | ICD-10-CM | POA: Diagnosis not present

## 2022-07-04 MED ORDER — NYSTATIN 100000 UNIT/GM EX POWD: 1.0000 | Freq: Three times a day (TID) | CUTANEOUS | 0 refills | Status: DC

## 2022-07-04 MED ORDER — NYSTATIN 100000 UNIT/GM EX CREA: 1.0000 | TOPICAL_CREAM | Freq: Two times a day (BID) | CUTANEOUS | 1 refills | Status: DC

## 2022-07-04 MED ORDER — FLUCONAZOLE 150 MG PO TABS: 150.0000 mg | ORAL_TABLET | ORAL | 0 refills | Status: DC | PRN

## 2022-07-04 NOTE — Progress Notes (Signed)
Virtual Visit Consent   Adam Sutton, you are scheduled for a virtual visit with a [facility] provider today. Just as with appointments in the office, your consent must be obtained to participate. Your consent will be active for this visit and any virtual visit you may have with one of our providers in the next 365 days. If you have a MyChart account, a copy of this consent can be sent to you electronically.  As this is a virtual visit, video technology does not allow for your provider to perform a traditional examination. This may limit your provider's ability to fully assess your condition. If your provider identifies any concerns that need to be evaluated in person or the need to arrange testing (such as labs, EKG, etc.), we will make arrangements to do so. Although advances in technology are sophisticated, we cannot ensure that it will always work on either your end or our end. If the connection with a video visit is poor, the visit may have to be switched to a telephone visit. With either a video or telephone visit, we are not always able to ensure that we have a secure connection.  By engaging in this virtual visit, you consent to the provision of healthcare and authorize for your insurance to be billed (if applicable) for the services provided during this visit. Depending on your insurance coverage, you may receive a charge related to this service.  I need to obtain your verbal consent now. Are you willing to proceed with your visit today? Adam Sutton has provided verbal consent on 07/04/2022 for a virtual visit (video or telephone). Adam Dun, FNP  Date: 07/04/2022 2:18 PM  Virtual Visit via Video Note   I, Adam Sutton, connected with  Adam Sutton  ([id number], 69-Oct-1957) on 07/04/22 at  4:45 PM EST by a video-enabled telemedicine application and verified that I am speaking with the correct person using two identifiers.  Location: Patient: Virtual Visit Location Patient:  Other: car Provider: Virtual Visit Location Provider: Office/Clinic   I discussed the limitations of evaluation and management by telemedicine and the availability of in person appointments. The patient expressed understanding and agreed to proceed.    History of Present Illness: Adam Sutton is a 67 y.o. who identifies as a male who was assigned male at birth, and is being seen today for rash in groin and swelling of penis about two weeks ago. Reports he has incontinence because of prostate surgery.   HPI: Rash This is a new problem. The current episode started 1 to 4 weeks ago. The problem has been gradually improving since onset. The affected locations include the groin and genitalia. The rash is characterized by redness and swelling. He was exposed to nothing. Pertinent negatives include no fatigue, fever, joint pain, rhinorrhea, shortness of breath or sore throat. Past treatments include antibiotic cream. The treatment provided mild relief.    Problems:  Patient Active Problem List   Diagnosis Date Noted   S/P cervical spinal fusion 04/13/2019   Vestibular neuritis 11/18/2018   Cervical radiculopathy    Nausea and vomiting in adult patient    Class 2 obesity due to excess calories without serious comorbidity with body mass index (BMI) of 37.0 to 37.9 in adult    Vertigo 08/22/2018   Dysplastic nevi 07/19/2018   Bleeding hemorrhoid 10/27/2017   BMI 37.0-37.9, adult 10/27/2017   Concussion without loss of consciousness 10/27/2017   Keratoacanthoma 10/27/2017   Paronychia of finger of left hand 10/27/2017  Personal history of prostate cancer 10/27/2017   Pure hypercholesterolemia 10/27/2017   Continuous leakage of urine 10/27/2017   Hypertension 08/17/2017   Diabetes mellitus without complication ([place]) 28/41/3244   Lipidemia 08/17/2017    Allergies:  Allergies  Allergen Reactions   Tape Other (See Comments)    blisters   Sulfamethoxazole-Trimethoprim Rash   Medications:   Current Outpatient Medications:    fluconazole (DIFLUCAN) 150 MG tablet, Take 1 tablet (150 mg total) by mouth every three (3) days as needed., Disp: 3 tablet, Rfl: 0   nystatin (MYCOSTATIN/NYSTOP) powder, Apply 1 Application topically 3 (three) times daily., Disp: 15 g, Rfl: 0   nystatin cream (MYCOSTATIN), Apply 1 Application topically 2 (two) times daily., Disp: 90 g, Rfl: 1   amLODipine (NORVASC) 5 MG tablet, Take 1 tablet by mouth once daily for blood pressure, Disp: 90 tablet, Rfl: 3   atorvastatin (LIPITOR) 40 MG tablet, Take 1 tablet (40 mg total) by mouth daily. for cholesterol., Disp: 90 tablet, Rfl: 3   fenofibrate 160 MG tablet, Take 1 tablet (160 mg total) by mouth daily. For cholesterol and triglyceride, Disp: 90 tablet, Rfl: 3   glimepiride (AMARYL) 2 MG tablet, Take 1 tablet by mouth once daily with breakfast, Disp: 90 tablet, Rfl: 3   lisinopril-hydrochlorothiazide (ZESTORETIC) 20-25 MG tablet, Take 1 tablet by mouth daily., Disp: 90 tablet, Rfl: 3   metFORMIN (GLUCOPHAGE-XR) 750 MG 24 hr tablet, Take 1 tablet (750 mg total) by mouth 2 (two) times daily with a meal., Disp: 180 tablet, Rfl: 3   metoprolol tartrate (LOPRESSOR) 100 MG tablet, Take 1 tablet (100 mg total) by mouth 2 (two) times daily., Disp: 180 tablet, Rfl: 3   ONETOUCH VERIO test strip, Check glucose twice daily, Disp: 200 each, Rfl: 11  Observations/Objective: Patient is well-developed, well-nourished in no acute distress.  Resting comfortably .  Head is normocephalic, atraumatic.  No labored breathing.  Speech is clear and coherent with logical content.  Patient is alert and oriented at baseline.  Testes erythemas, no swelling noted   Assessment and Plan: 1. Candidal skin infection - nystatin (MYCOSTATIN/NYSTOP) powder; Apply 1 Application topically 3 (three) times daily.  Dispense: 15 g; Refill: 0 - nystatin cream (MYCOSTATIN); Apply 1 Application topically 2 (two) times daily.  Dispense: 90 g; Refill:  1 - fluconazole (DIFLUCAN) 150 MG tablet; Take 1 tablet (150 mg total) by mouth every three (3) days as needed.  Dispense: 3 tablet; Refill: 0  2. Urinary incontinence, unspecified type  Keep clean and dry Avoid scratching Report any increased swelling, erythemas, or fevers Follow up if symptoms worsen or do not improve    Follow Up Instructions: I discussed the assessment and treatment plan with the patient. The patient was provided an opportunity to ask questions and all were answered. The patient agreed with the plan and demonstrated an understanding of the instructions.  A copy of instructions were sent to the patient via MyChart unless otherwise noted below.     The patient was advised to call back or seek an in-person evaluation if the symptoms worsen or if the condition fails to improve as anticipated.  Time:  I spent 12 minutes with the patient via telehealth technology discussing the above problems/concerns.    Adam Dun, FNP

## 2022-07-08 LAB — HM DIABETES EYE EXAM

## 2022-07-22 ENCOUNTER — Ambulatory Visit: Payer: Medicare Other | Admitting: Family Medicine

## 2022-10-13 ENCOUNTER — Other Ambulatory Visit: Payer: Self-pay | Admitting: Family Medicine

## 2022-11-26 ENCOUNTER — Encounter: Payer: Self-pay | Admitting: Family Medicine

## 2022-11-26 ENCOUNTER — Ambulatory Visit: Payer: Medicare Other | Admitting: Family Medicine

## 2022-11-26 ENCOUNTER — Ambulatory Visit (INDEPENDENT_AMBULATORY_CARE_PROVIDER_SITE_OTHER): Payer: Medicare Other

## 2022-11-26 VITALS — BP 162/81 | HR 60 | Temp 97.5°F | Ht 68.0 in | Wt 244.0 lb

## 2022-11-26 DIAGNOSIS — M25552 Pain in left hip: Secondary | ICD-10-CM

## 2022-11-26 DIAGNOSIS — E1141 Type 2 diabetes mellitus with diabetic mononeuropathy: Secondary | ICD-10-CM | POA: Diagnosis not present

## 2022-11-26 DIAGNOSIS — G8929 Other chronic pain: Secondary | ICD-10-CM

## 2022-11-26 DIAGNOSIS — M461 Sacroiliitis, not elsewhere classified: Secondary | ICD-10-CM

## 2022-11-26 DIAGNOSIS — Z7984 Long term (current) use of oral hypoglycemic drugs: Secondary | ICD-10-CM

## 2022-11-26 NOTE — Progress Notes (Signed)
Subjective:  Patient ID: Adam Sutton, male    DOB: June 12, 1956  Age: 67 y.o. MRN: [id number]  CC: Hip Pain (Left hip been going on 2 years comes and goes had xray 2 years ago he was not sure he needed onther ) and Rash (Around private aarea )   HPI [name] presents for left posterior hip aches. Constant for years. On two occasions recently it went out when walking on an uneven surface. XR 06/2020 reviewed showed SI and lumbar spondylosis. Baseline 2/10. Some bad ornings but goes away by noon. Has tickling in right 1-3toes  Wants Dr. Rhea Belton for colonoscopy. Brings in multiple recent glucose readings. Some fasting, others random, none marked as such. Range is 119-209.     11/26/2022   10:49 AM 02/27/2022    1:37 PM 01/16/2021    3:22 PM  Depression screen PHQ 2/9  Decreased Interest 0 0 0  Down, Depressed, Hopeless 0 0 0  PHQ - 2 Score 0 0 0    History [name] has a past medical history of Cancer (HCC), Cervical disc disorder with radiculopathy, Cervical stenosis of spine, Diabetes mellitus without complication (HCC), Hypertension, Incontinence, Lipidemia, Nerve pain due to spinal stenosis, Sleep apnea (10/14/11), and Vertigo.   He has a past surgical history that includes Umbilical hernia repair; bilateral jaw surgery; Robot assisted laparoscopic radical prostatectomy (10/22/2011); Prostate surgery; Vasectomy; Tonsillectomy; and Anterior cervical decomp/discectomy fusion (N/A, 04/13/2019).   His family history includes Cancer in his father, maternal grandmother, and mother; Diabetes in his father; Heart disease in his father; Hyperlipidemia in his father; Hypertension in his father.He reports that he has never smoked. He has never used smokeless tobacco. He reports current alcohol use. He reports that he does not use drugs.    ROS Review of Systems  Constitutional:  Negative for fever.  Respiratory:  Negative for shortness of breath.   Cardiovascular:  Negative for chest  pain.  Musculoskeletal:  Negative for arthralgias.  Skin:  Negative for rash.    Objective:  BP (!) 162/81   Pulse 60   Temp (!) 97.5 F (36.4 C) (Temporal)   Ht 5\' 8"  (1.727 m)   Wt 244 lb (110.7 kg)   SpO2 97%   BMI 37.10 kg/m   BP Readings from Last 3 Encounters:  11/26/22 (!) 162/81  02/27/22 138/81  01/16/21 140/86    Wt Readings from Last 3 Encounters:  11/26/22 244 lb (110.7 kg)  02/27/22 251 lb 6.4 oz (114 kg)  01/16/21 251 lb 9.6 oz (114.1 kg)     Physical Exam Vitals reviewed.  Constitutional:      Appearance: He is well-developed.  HENT:     Head: Normocephalic and atraumatic.     Right Ear: External ear normal.     Left Ear: External ear normal.     Mouth/Throat:     Pharynx: No oropharyngeal exudate or posterior oropharyngeal erythema.  Eyes:     Pupils: Pupils are equal, round, and reactive to light.  Cardiovascular:     Rate and Rhythm: Normal rate and regular rhythm.     Heart sounds: No murmur heard. Pulmonary:     Effort: No respiratory distress.     Breath sounds: Normal breath sounds.  Musculoskeletal:        General: Tenderness (left SI joint) present.     Cervical back: Normal range of motion and neck supple.  Skin:    General: Skin is warm and dry.  Neurological:  Mental Status: He is alert and oriented to person, place, and time.     Comments: Right 1-3 toes feel like being tickled all the time       Assessment & Plan:   Andruw was seen today for hip pain and rash.  Diagnoses and all orders for this visit:  Chronic left hip pain -     DG HIP UNILAT W OR W/O PELVIS 2-3 VIEWS LEFT; Future  Sacroiliitis (HCC)  Diabetic mononeuropathy associated with type 2 diabetes mellitus (HCC)       I have discontinued Champion Birkeland's nystatin, nystatin cream, and fluconazole. I am also having him maintain his amLODipine, atorvastatin, fenofibrate, glimepiride, lisinopril-hydrochlorothiazide, metFORMIN, metoprolol tartrate,  and OneTouch Verio.  Allergies as of 11/26/2022       Reactions   Tape Other (See Comments)   blisters   Sulfamethoxazole-trimethoprim Rash        Medication List        Accurate as of November 26, 2022  6:06 PM. If you have any questions, ask your nurse or doctor.          STOP taking these medications    fluconazole 150 MG tablet Commonly known as: DIFLUCAN Stopped by: Mechele Claude, MD   nystatin cream Commonly known as: MYCOSTATIN Stopped by: Mechele Claude, MD   nystatin powder Commonly known as: MYCOSTATIN/NYSTOP Stopped by: Mechele Claude, MD       TAKE these medications    amLODipine 5 MG tablet Commonly known as: NORVASC Take 1 tablet by mouth once daily for blood pressure   atorvastatin 40 MG tablet Commonly known as: LIPITOR Take 1 tablet (40 mg total) by mouth daily. for cholesterol.   fenofibrate 160 MG tablet Take 1 tablet (160 mg total) by mouth daily. For cholesterol and triglyceride   glimepiride 2 MG tablet Commonly known as: AMARYL Take 1 tablet by mouth once daily with breakfast   lisinopril-hydrochlorothiazide 20-25 MG tablet Commonly known as: ZESTORETIC Take 1 tablet by mouth daily.   metFORMIN 750 MG 24 hr tablet Commonly known as: GLUCOPHAGE-XR Take 1 tablet (750 mg total) by mouth 2 (two) times daily with a meal.   metoprolol tartrate 100 MG tablet Commonly known as: LOPRESSOR Take 1 tablet (100 mg total) by mouth 2 (two) times daily.   OneTouch Verio test strip Generic drug: glucose blood Check glucose twice daily Dx E11.9         Follow-up: Return in about 1 month (around 12/26/2022) for diabetes.  Mechele Claude, M.D.

## 2022-12-10 ENCOUNTER — Ambulatory Visit (INDEPENDENT_AMBULATORY_CARE_PROVIDER_SITE_OTHER): Payer: Medicare Other | Admitting: Family Medicine

## 2022-12-10 ENCOUNTER — Encounter: Payer: Self-pay | Admitting: Family Medicine

## 2022-12-10 VITALS — BP 132/72 | HR 57 | Temp 98.0°F | Ht 68.0 in | Wt 245.2 lb

## 2022-12-10 DIAGNOSIS — I10 Essential (primary) hypertension: Secondary | ICD-10-CM | POA: Diagnosis not present

## 2022-12-10 DIAGNOSIS — E782 Mixed hyperlipidemia: Secondary | ICD-10-CM | POA: Diagnosis not present

## 2022-12-10 DIAGNOSIS — Z1211 Encounter for screening for malignant neoplasm of colon: Secondary | ICD-10-CM

## 2022-12-10 DIAGNOSIS — Z0001 Encounter for general adult medical examination with abnormal findings: Secondary | ICD-10-CM | POA: Diagnosis not present

## 2022-12-10 DIAGNOSIS — Z6837 Body mass index (BMI) 37.0-37.9, adult: Secondary | ICD-10-CM

## 2022-12-10 DIAGNOSIS — E119 Type 2 diabetes mellitus without complications: Secondary | ICD-10-CM | POA: Diagnosis not present

## 2022-12-10 DIAGNOSIS — N3945 Continuous leakage: Secondary | ICD-10-CM

## 2022-12-10 DIAGNOSIS — Z0000 Encounter for general adult medical examination without abnormal findings: Secondary | ICD-10-CM

## 2022-12-10 LAB — LIPID PANEL

## 2022-12-10 LAB — URINALYSIS
Bilirubin, UA: NEGATIVE
Glucose, UA: NEGATIVE
Ketones, UA: NEGATIVE
Leukocytes,UA: NEGATIVE
Nitrite, UA: NEGATIVE
Protein,UA: NEGATIVE
RBC, UA: NEGATIVE
Specific Gravity, UA: 1.02 (ref 1.005–1.030)
Urobilinogen, Ur: 1 mg/dL (ref 0.2–1.0)
pH, UA: 6.5 (ref 5.0–7.5)

## 2022-12-10 LAB — BAYER DCA HB A1C WAIVED: HB A1C (BAYER DCA - WAIVED): 7.6 % — ABNORMAL HIGH (ref 4.8–5.6)

## 2022-12-10 NOTE — Progress Notes (Signed)
Subjective:  Patient ID: Adam Sutton,  male    DOB: 03-Dec-1955  Age: 67 y.o.    CC: Annual Exam   HPI Adam Sutton presents for annual physical.    follow-up of hypertension. Patient has no history of headache chest pain or shortness of breath or recent cough. Patient also denies symptoms of TIA such as numbness weakness lateralizing. Patient denies side effects from medication. States taking it regularly.  Patient also  in for follow-up of elevated cholesterol. Doing well without complaints on current medication. Denies side effects  including myalgia and arthralgia and nausea. Also in today for liver function testing. Currently no chest pain, shortness of breath or other cardiovascular related symptoms noted.  Follow-up of diabetes. Patient denies symptoms such as excessive hunger or urinary frequency, excessive hunger, nausea No significant hypoglycemic spells noted. Medications reviewed. Pt reports taking them regularly. Pt. denies complication/adverse reaction today.   Hip pain persists. Sneezed this morning and almost ended up on the ground. 2-3/10 when it subsides. Becoming more frequent.    Urinary urge incontinence since prostatectomy   History Adam Sutton has a past medical history of Bleeding hemorrhoid (10/27/2017), Cancer [facility]), Cervical disc disorder with radiculopathy, Cervical stenosis of spine, Diabetes mellitus without complication (HCC), Hypertension, Incontinence, Lipidemia, Nerve pain due to spinal stenosis, Sleep apnea (10/14/2011), and Vertigo.   He has a past surgical history that includes Umbilical hernia repair; bilateral jaw surgery; Robot assisted laparoscopic radical prostatectomy (10/22/2011); Prostate surgery; Vasectomy; Tonsillectomy; and Anterior cervical decomp/discectomy fusion (N/A, 04/13/2019).   His family history includes Cancer in his father, maternal grandmother, and mother; Diabetes in his father; Heart disease in his father; Hyperlipidemia  in his father; Hypertension in his father.He reports that he has never smoked. He has never used smokeless tobacco. He reports current alcohol use. He reports that he does not use drugs.  Current Outpatient Medications on File Prior to Visit  Medication Sig Dispense Refill   ONETOUCH VERIO test strip Check glucose twice daily Dx E11.9 200 each 3   No current facility-administered medications on file prior to visit.    ROS Review of Systems  Objective:  BP 132/72   Pulse (!) 57   Temp 98 F (36.7 C)   Ht 5\' 8"  (1.727 m)   Wt 245 lb 3.2 oz (111.2 kg)   SpO2 98%   BMI 37.28 kg/m   BP Readings from Last 3 Encounters:  12/10/22 132/72  11/26/22 (!) 162/81  02/27/22 138/81    Wt Readings from Last 3 Encounters:  12/10/22 245 lb 3.2 oz (111.2 kg)  11/26/22 244 lb (110.7 kg)  02/27/22 251 lb 6.4 oz (114 kg)     Physical Exam Constitutional:      Appearance: He is well-developed.  HENT:     Head: Normocephalic and atraumatic.  Eyes:     Pupils: Pupils are equal, round, and reactive to light.  Neck:     Thyroid: No thyromegaly.     Trachea: No tracheal deviation.  Cardiovascular:     Rate and Rhythm: Normal rate and regular rhythm.     Heart sounds: Normal heart sounds. No murmur heard.    No friction rub. No gallop.  Pulmonary:     Breath sounds: Normal breath sounds. No wheezing or rales.  Abdominal:     General: Bowel sounds are normal. There is no distension.     Palpations: Abdomen is soft. There is no mass.     Tenderness: There is no abdominal  tenderness.     Hernia: There is no hernia in the left inguinal area.  Musculoskeletal:        General: Normal range of motion.     Cervical back: Normal range of motion.  Lymphadenopathy:     Cervical: No cervical adenopathy.  Skin:    General: Skin is warm and dry.  Neurological:     Mental Status: He is alert and oriented to person, place, and time.     Diabetic Foot Exam - Simple   No data filed     Lab  Results  Component Value Date   HGBA1C 7.6 (H) 12/10/2022   HGBA1C 9.6 (H) 02/27/2022   HGBA1C 7.3 (H) 01/16/2021    Assessment & Plan:   Adam Sutton was seen today for annual exam.  Diagnoses and all orders for this visit:  Well adult exam -     Bayer DCA Hb A1c Waived -     CBC with Differential/Platelet -     CMP14+EGFR -     Lipid panel -     Urinalysis  Essential hypertension -     CBC with Differential/Platelet -     CMP14+EGFR  Mixed hyperlipidemia -     Lipid panel  Diabetes mellitus without complication (HCC) -     Bayer DCA Hb A1c Waived -     Microalbumin / creatinine urine ratio  BMI 37.0-37.9, adult  Primary hypertension  Screen for colon cancer -     Ambulatory referral to Gastroenterology  Continuous leakage of urine -     Ambulatory referral to Urology  Other orders -     amLODipine (NORVASC) 5 MG tablet; Take 1 tablet by mouth once daily for blood pressure -     atorvastatin (LIPITOR) 40 MG tablet; Take 1 tablet (40 mg total) by mouth daily. for cholesterol. -     fenofibrate 160 MG tablet; Take 1 tablet (160 mg total) by mouth daily. For cholesterol and triglyceride -     glimepiride (AMARYL) 2 MG tablet; Take 1 tablet by mouth once daily with breakfast -     lisinopril-hydrochlorothiazide (ZESTORETIC) 20-25 MG tablet; Take 1 tablet by mouth daily. -     metFORMIN (GLUCOPHAGE-XR) 750 MG 24 hr tablet; Take 1 tablet (750 mg total) by mouth 2 (two) times daily with a meal. -     metoprolol tartrate (LOPRESSOR) 100 MG tablet; Take 1 tablet (100 mg total) by mouth 2 (two) times daily.   I am having Adam Sutton maintain his OneTouch Verio, amLODipine, atorvastatin, fenofibrate, glimepiride, lisinopril-hydrochlorothiazide, metFORMIN, and metoprolol tartrate.  Meds ordered this encounter  Medications   amLODipine (NORVASC) 5 MG tablet    Sig: Take 1 tablet by mouth once daily for blood pressure    Dispense:  90 tablet    Refill:  2   atorvastatin  (LIPITOR) 40 MG tablet    Sig: Take 1 tablet (40 mg total) by mouth daily. for cholesterol.    Dispense:  90 tablet    Refill:  2   fenofibrate 160 MG tablet    Sig: Take 1 tablet (160 mg total) by mouth daily. For cholesterol and triglyceride    Dispense:  90 tablet    Refill:  2   glimepiride (AMARYL) 2 MG tablet    Sig: Take 1 tablet by mouth once daily with breakfast    Dispense:  90 tablet    Refill:  2   lisinopril-hydrochlorothiazide (ZESTORETIC) 20-25 MG tablet  Sig: Take 1 tablet by mouth daily.    Dispense:  90 tablet    Refill:  2   metFORMIN (GLUCOPHAGE-XR) 750 MG 24 hr tablet    Sig: Take 1 tablet (750 mg total) by mouth 2 (two) times daily with a meal.    Dispense:  180 tablet    Refill:  2   metoprolol tartrate (LOPRESSOR) 100 MG tablet    Sig: Take 1 tablet (100 mg total) by mouth 2 (two) times daily.    Dispense:  180 tablet    Refill:  2     Follow-up: Return in about 3 months (around 03/12/2023).  Mechele Claude, M.D.

## 2022-12-11 LAB — CBC WITH DIFFERENTIAL/PLATELET
Basophils Absolute: 0.1 10*3/uL (ref 0.0–0.2)
Basos: 1 %
EOS (ABSOLUTE): 0.2 10*3/uL (ref 0.0–0.4)
Eos: 3 %
Hematocrit: 44.1 % (ref 37.5–51.0)
Hemoglobin: 14.2 g/dL (ref 13.0–17.7)
Immature Grans (Abs): 0 10*3/uL (ref 0.0–0.1)
Immature Granulocytes: 0 %
Lymphocytes Absolute: 2.1 10*3/uL (ref 0.7–3.1)
Lymphs: 28 %
MCH: 26.9 pg (ref 26.6–33.0)
MCHC: 32.2 g/dL (ref 31.5–35.7)
MCV: 84 fL (ref 79–97)
Monocytes Absolute: 0.8 10*3/uL (ref 0.1–0.9)
Monocytes: 11 %
Neutrophils Absolute: 4.3 10*3/uL (ref 1.4–7.0)
Neutrophils: 57 %
Platelets: 307 10*3/uL (ref 150–450)
RBC: 5.28 x10E6/uL (ref 4.14–5.80)
RDW: 14.1 % (ref 11.6–15.4)
WBC: 7.6 10*3/uL (ref 3.4–10.8)

## 2022-12-11 LAB — CMP14+EGFR
ALT: 17 IU/L (ref 0–44)
AST: 18 IU/L (ref 0–40)
Albumin: 4.5 g/dL (ref 3.9–4.9)
Alkaline Phosphatase: 77 IU/L (ref 44–121)
BUN/Creatinine Ratio: 22 (ref 10–24)
BUN: 22 mg/dL (ref 8–27)
Bilirubin Total: 0.6 mg/dL (ref 0.0–1.2)
CO2: 24 mmol/L (ref 20–29)
Calcium: 9.7 mg/dL (ref 8.6–10.2)
Chloride: 98 mmol/L (ref 96–106)
Creatinine, Ser: 0.99 mg/dL (ref 0.76–1.27)
Globulin, Total: 2.4 g/dL (ref 1.5–4.5)
Glucose: 128 mg/dL — ABNORMAL HIGH (ref 70–99)
Potassium: 3.8 mmol/L (ref 3.5–5.2)
Sodium: 140 mmol/L (ref 134–144)
Total Protein: 6.9 g/dL (ref 6.0–8.5)
eGFR: 83 mL/min/{1.73_m2} (ref >59)

## 2022-12-11 LAB — LIPID PANEL
Cholesterol, Total: 118 mg/dL (ref 100–199)
LDL Chol Calc (NIH): 56 mg/dL (ref 0–99)
VLDL Cholesterol Cal: 30 mg/dL (ref 5–40)

## 2022-12-11 NOTE — Progress Notes (Signed)
Hello Adam Sutton,  Your lab result is normal and/or stable.Some minor variations that are not significant are commonly marked abnormal, but do not represent any medical problem for you.  Best regards, Leya Paige, M.D.

## 2022-12-14 ENCOUNTER — Encounter: Payer: Self-pay | Admitting: Family Medicine

## 2022-12-14 MED ORDER — METFORMIN HCL ER 750 MG PO TB24: 750.0000 mg | ORAL_TABLET | Freq: Two times a day (BID) | ORAL | 2 refills | Status: DC

## 2022-12-14 MED ORDER — ATORVASTATIN CALCIUM 40 MG PO TABS: 40.0000 mg | ORAL_TABLET | Freq: Every day | ORAL | 2 refills | Status: DC

## 2022-12-14 MED ORDER — FENOFIBRATE 160 MG PO TABS: 160.0000 mg | ORAL_TABLET | Freq: Every day | ORAL | 2 refills | Status: DC

## 2022-12-14 MED ORDER — GLIMEPIRIDE 2 MG PO TABS: ORAL_TABLET | ORAL | 2 refills | Status: DC

## 2022-12-14 MED ORDER — METOPROLOL TARTRATE 100 MG PO TABS: 100.0000 mg | ORAL_TABLET | Freq: Two times a day (BID) | ORAL | 2 refills | Status: DC

## 2022-12-14 MED ORDER — LISINOPRIL-HYDROCHLOROTHIAZIDE 20-25 MG PO TABS: 1.0000 | ORAL_TABLET | Freq: Every day | ORAL | 2 refills | Status: DC

## 2022-12-14 MED ORDER — AMLODIPINE BESYLATE 5 MG PO TABS: ORAL_TABLET | ORAL | 2 refills | Status: DC

## 2022-12-17 ENCOUNTER — Encounter: Payer: Self-pay | Admitting: Internal Medicine

## 2022-12-22 ENCOUNTER — Encounter: Payer: Self-pay | Admitting: Urology

## 2022-12-22 ENCOUNTER — Ambulatory Visit: Payer: Medicare Other | Admitting: Urology

## 2022-12-22 VITALS — BP 161/115 | HR 65 | Wt 250.0 lb

## 2022-12-22 DIAGNOSIS — R32 Unspecified urinary incontinence: Secondary | ICD-10-CM | POA: Diagnosis not present

## 2022-12-22 DIAGNOSIS — Z8546 Personal history of malignant neoplasm of prostate: Secondary | ICD-10-CM | POA: Diagnosis not present

## 2022-12-22 LAB — MICROSCOPIC EXAMINATION
Cast Type: NONE SEEN
Casts: NONE SEEN /lpf
Crystal Type: NONE SEEN
Crystals: NONE SEEN
Renal Epithel, UA: NONE SEEN /hpf
Trichomonas, UA: NONE SEEN
Yeast, UA: NONE SEEN

## 2022-12-22 LAB — URINALYSIS, ROUTINE W REFLEX MICROSCOPIC
Bilirubin, UA: NEGATIVE
Ketones, UA: NEGATIVE
Leukocytes,UA: NEGATIVE
Nitrite, UA: NEGATIVE
Specific Gravity, UA: 1.03 (ref 1.005–1.030)
Urobilinogen, Ur: 0.2 mg/dL (ref 0.2–1.0)
pH, UA: 6 (ref 5.0–7.5)

## 2022-12-22 LAB — BLADDER SCAN AMB NON-IMAGING

## 2022-12-22 NOTE — Progress Notes (Signed)
Assessment: 1. Urinary incontinence - post-prostatectomy   2. Personal history of prostate cancer; s/p RALP 5/13; pT2cNx; PSA undetectable      Plan: I personally reviewed the patient's chart including provider notes, and lab results. I reviewed the patient records from [facility]. I discussed the options for management of his post prostatectomy incontinence including male sling and artificial urinary sphincter.  I briefly discussed the pros and cons of both treatment modalities.  He was very well-informed on both of these treatment options.  He has concerns about the success rate of the male sling procedure and is actually interested in an artificial urinary sphincter.  I advised him that I do not have experience with either the male sling or artificial urinary sphincter procedure and would recommend that he seek care from someone who performs these on a regular basis. Will contact him with recommendations for urologist routinely performing male sling and AUS procedure Return to office prn  Chief Complaint:  Chief Complaint  Patient presents with   Prostate Cancer   Urinary Incontinence    History of Present Illness:  Adam Sutton is a 67 y.o. male who is seen in consultation from Mechele Claude, MD for evaluation of post prostatectomy urinary incontinence. He is status post a robotic assisted laparoscopic prostatectomy in May 2013 for Gleason 3+4 = 7, pT2cNx adenocarcinoma.  His PSA has remained undetectable postoperatively. PSA from 9/23: <0.1  He has a history of stress incontinence following the radical prostatectomy.  He has been deviously used a Cunningham clamp for management.  He was evaluated with cystoscopy showing an open bladder neck without any foreign body.  Urodynamic evaluation from 2015 showed a maximum capacity of 470 mL, 2 low-pressure unstable contractions 1 following a Valsalva with associated leakage, Valsalva leak point pressure 21 cm of water and at  200 mL, 27 cm of water.  He voided voluntarily with a max flow of 37 mL/s and a max voiding pressure of 22 cm of water.  EMG activity was normal.  He was given trials of Toviaz and Myrbetriq without significant benefit.  Surgical management with a male sling or artificial urinary sphincter had been discussed previously.  He elected to forego any surgical management at that time.  He presents today for continued evaluation of his urinary incontinence following his prostatectomy.  He continues to have significant incontinence throughout the day, primarily associated with any type of movement or activity.  He does not have significant nighttime incontinence unless he gets up to use the bathroom.  He does occasionally void with a good stream.  He is using 2-3 maxi pads per day as well as 1-2 adult diapers per day.  He is actually having leakage episodes that the incontinence pads and diapers do not hold.  He was treated for a UTI in January 2024.  No recent UTI symptoms.  No dysuria or gross hematuria. He has had recent onset of fecal incontinence as well.  He is scheduled to undergo a GI evaluation.    Past Medical History:  Past Medical History:  Diagnosis Date   Bleeding hemorrhoid 10/27/2017   Cancer [facility])    prostate cancer   Cervical disc disorder with radiculopathy    right arm numbness   Cervical stenosis of spine    Diabetes mellitus without complication (HCC)    Hypertension    Incontinence    Lipidemia    Nerve pain due to spinal stenosis    Sleep apnea 10/14/2011   STOP  BANG SCORE 4   Vertigo     Past Surgical History:  Past Surgical History:  Procedure Laterality Date   ANTERIOR CERVICAL DECOMP/DISCECTOMY FUSION N/A 04/13/2019   Procedure: ANTERIOR CERVICAL DECOMPRESSION FUSION CERVICAL FIVE-SIX,CERVICAL SIX-SEVEN;  Surgeon: Tia Alert, MD;  Location: [facility] OR;  Service: Neurosurgery;  Laterality: N/A;  anterior   bilateral jaw surgery     40+ yrs ago- for TMJ PROBLEMS AND  WISDOM TEETH IMPACTIONS   PROSTATE SURGERY     ROBOT ASSISTED LAPAROSCOPIC RADICAL PROSTATECTOMY  10/22/2011   Procedure: ROBOTIC ASSISTED LAPAROSCOPIC RADICAL PROSTATECTOMY;  Surgeon: Milford Cage, MD;  Location: [facility] ORS;  Service: Urology;  Laterality: N/A;      TONSILLECTOMY     UMBILICAL HERNIA REPAIR     VASECTOMY      Allergies:  Allergies  Allergen Reactions   Tape Other (See Comments)    blisters   Sulfamethoxazole-Trimethoprim Rash    Family History:  Family History  Problem Relation Age of Onset   Cancer Mother        lung   Cancer Father        prostate   Diabetes Father    Heart disease Father    Hyperlipidemia Father    Hypertension Father    Cancer Maternal Grandmother        breast    Social History:  Social History   Tobacco Use   Smoking status: Never   Smokeless tobacco: Never  Vaping Use   Vaping Use: Never used  Substance Use Topics   Alcohol use: Yes    Comment: OCCAS BEER   Drug use: No    Review of symptoms:  Constitutional:  Negative for unexplained weight loss, night sweats, fever, chills ENT:  Negative for nose bleeds, sinus pain, painful swallowing CV:  Negative for chest pain, shortness of breath, exercise intolerance, palpitations, loss of consciousness Resp:  Negative for cough, wheezing, shortness of breath GI:  Negative for nausea, vomiting, diarrhea, bloody stools GU:  Positives noted in HPI; otherwise negative for gross hematuria, dysuria Neuro:  Negative for seizures, poor balance, limb weakness, slurred speech Psych:  Negative for lack of energy, depression, anxiety Endocrine:  Negative for polydipsia, polyuria, symptoms of hypoglycemia (dizziness, hunger, sweating) Hematologic:  Negative for anemia, purpura, petechia, prolonged or excessive bleeding, use of anticoagulants  Allergic:  Negative for difficulty breathing or choking as a result of exposure to anything; no shellfish allergy; no allergic response  (rash/itch) to materials, foods  Physical exam: BP (!) 161/115   Pulse 65   Wt 250 lb (113.4 kg)   BMI 38.01 kg/m  GENERAL APPEARANCE:  Well appearing, well developed, well nourished, NAD HEENT: Atraumatic, Normocephalic, oropharynx clear. NECK: Supple without lymphadenopathy or thyromegaly. LUNGS: Clear to auscultation bilaterally. HEART: Regular Rate and Rhythm without murmurs, gallops, or rubs. ABDOMEN: Soft, non-tender, No Masses. EXTREMITIES: Moves all extremities well.  Without clubbing, cyanosis, or edema. NEUROLOGIC:  Alert and oriented x 3, normal gait, CN II-XII grossly intact.  MENTAL STATUS:  Appropriate. BACK:  Non-tender to palpation.  No CVAT SKIN:  Warm, dry and intact.    Results: U/A:  0-5 WBC, 0-2 RBC  Results for orders placed or performed in visit on 12/22/22 (from the past 24 hour(s))  Bladder Scan (Post Void Residual) in office   Collection Time: 12/22/22 10:34 AM  Result Value Ref Range   Scan Result 0ml

## 2023-01-07 ENCOUNTER — Encounter: Payer: Self-pay | Admitting: Urology

## 2023-01-15 ENCOUNTER — Other Ambulatory Visit: Payer: Self-pay | Admitting: Urology

## 2023-01-15 DIAGNOSIS — R32 Unspecified urinary incontinence: Secondary | ICD-10-CM

## 2023-01-27 ENCOUNTER — Encounter: Payer: Self-pay | Admitting: *Deleted

## 2023-01-30 ENCOUNTER — Ambulatory Visit: Payer: Medicare Other

## 2023-01-30 VITALS — Ht 69.0 in | Wt 240.0 lb

## 2023-01-30 DIAGNOSIS — Z1211 Encounter for screening for malignant neoplasm of colon: Secondary | ICD-10-CM

## 2023-01-30 MED ORDER — NA SULFATE-K SULFATE-MG SULF 17.5-3.13-1.6 GM/177ML PO SOLN
1.0000 | Freq: Once | ORAL | 0 refills | Status: AC
Start: 2023-01-30 — End: 2023-01-30

## 2023-01-30 NOTE — Progress Notes (Signed)

## 2023-02-10 ENCOUNTER — Encounter: Payer: Self-pay | Admitting: Internal Medicine

## 2023-02-21 ENCOUNTER — Encounter: Payer: Self-pay | Admitting: Certified Registered Nurse Anesthetist

## 2023-02-26 ENCOUNTER — Ambulatory Visit (AMBULATORY_SURGERY_CENTER): Payer: Medicare Other | Admitting: Internal Medicine

## 2023-02-26 ENCOUNTER — Encounter: Payer: Self-pay | Admitting: Internal Medicine

## 2023-02-26 VITALS — BP 160/84 | HR 69 | Temp 98.4°F | Resp 18 | Ht 68.0 in | Wt 240.0 lb

## 2023-02-26 DIAGNOSIS — D120 Benign neoplasm of cecum: Secondary | ICD-10-CM | POA: Diagnosis not present

## 2023-02-26 DIAGNOSIS — K635 Polyp of colon: Secondary | ICD-10-CM | POA: Diagnosis not present

## 2023-02-26 DIAGNOSIS — D124 Benign neoplasm of descending colon: Secondary | ICD-10-CM

## 2023-02-26 DIAGNOSIS — D122 Benign neoplasm of ascending colon: Secondary | ICD-10-CM | POA: Diagnosis not present

## 2023-02-26 DIAGNOSIS — D125 Benign neoplasm of sigmoid colon: Secondary | ICD-10-CM

## 2023-02-26 DIAGNOSIS — D123 Benign neoplasm of transverse colon: Secondary | ICD-10-CM

## 2023-02-26 DIAGNOSIS — Z1211 Encounter for screening for malignant neoplasm of colon: Secondary | ICD-10-CM

## 2023-02-26 MED ORDER — SODIUM CHLORIDE 0.9 % IV SOLN
500.0000 mL | INTRAVENOUS | Status: DC
Start: 2023-02-26 — End: 2023-02-26

## 2023-02-26 NOTE — Progress Notes (Signed)
Pt's states no medical or surgical changes since previsit or office visit. 

## 2023-02-26 NOTE — Progress Notes (Signed)
GASTROENTEROLOGY PROCEDURE H&P NOTE   Primary Care Physician: Mechele Claude, MD    Reason for Procedure:  Colon cancer screening  Plan:    Colonoscopy  Patient is appropriate for endoscopic procedure(s) in the ambulatory ([facility]) setting.  The nature of the procedure, as well as the risks, benefits, and alternatives were carefully and thoroughly reviewed with the patient. Ample time for discussion and questions allowed. The patient understood, was satisfied, and agreed to proceed.     HPI: Adam Sutton is a 67 y.o. male who presents for colonoscopy.  Medical history as below.  Tolerated the prep.  No recent chest pain or shortness of breath.  No abdominal pain today.  Past Medical History:  Diagnosis Date   Bleeding hemorrhoid 10/27/2017   Cancer [facility])    prostate cancer   Cervical disc disorder with radiculopathy    right arm numbness   Cervical stenosis of spine    Diabetes mellitus without complication (HCC)    Hypertension    Incontinence    Lipidemia    Nerve pain due to spinal stenosis    Sleep apnea 10/14/2011   STOP BANG SCORE 4   Vertigo     Past Surgical History:  Procedure Laterality Date   ANTERIOR CERVICAL DECOMP/DISCECTOMY FUSION N/A 04/13/2019   Procedure: ANTERIOR CERVICAL DECOMPRESSION FUSION CERVICAL FIVE-SIX,CERVICAL SIX-SEVEN;  Surgeon: Tia Alert, MD;  Location: [facility] OR;  Service: Neurosurgery;  Laterality: N/A;  anterior   bilateral jaw surgery     40+ yrs ago- for TMJ PROBLEMS AND WISDOM TEETH IMPACTIONS   PROSTATE SURGERY     ROBOT ASSISTED LAPAROSCOPIC RADICAL PROSTATECTOMY  10/22/2011   Procedure: ROBOTIC ASSISTED LAPAROSCOPIC RADICAL PROSTATECTOMY;  Surgeon: Milford Cage, MD;  Location: [facility]S;  Service: Urology;  Laterality: N/A;      TONSILLECTOMY     UMBILICAL HERNIA REPAIR  2003   VASECTOMY      Prior to Admission medications   Medication Sig Start Date End Date Taking? Authorizing Provider  amLODipine (NORVASC) 5  MG tablet Take 1 tablet by mouth once daily for blood pressure 12/14/22  Yes Stacks, Broadus John, MD  atorvastatin (LIPITOR) 40 MG tablet Take 1 tablet (40 mg total) by mouth daily. for cholesterol. 12/14/22  Yes Mechele Claude, MD  fenofibrate 160 MG tablet Take 1 tablet (160 mg total) by mouth daily. For cholesterol and triglyceride 12/14/22  Yes Stacks, Broadus John, MD  glimepiride (AMARYL) 2 MG tablet Take 1 tablet by mouth once daily with breakfast 12/14/22  Yes Stacks, Broadus John, MD  lisinopril-hydrochlorothiazide (ZESTORETIC) 20-25 MG tablet Take 1 tablet by mouth daily. 12/14/22  Yes Mechele Claude, MD  metFORMIN (GLUCOPHAGE-XR) 750 MG 24 hr tablet Take 1 tablet (750 mg total) by mouth 2 (two) times daily with a meal. 12/14/22  Yes Mechele Claude, MD  metoprolol tartrate (LOPRESSOR) 100 MG tablet Take 1 tablet (100 mg total) by mouth 2 (two) times daily. 12/14/22  Yes Mechele Claude, MD  [facility] VERIO test strip Check glucose twice daily Dx E11.9 10/13/22  Yes Mechele Claude, MD    Current Outpatient Medications  Medication Sig Dispense Refill   amLODipine (NORVASC) 5 MG tablet Take 1 tablet by mouth once daily for blood pressure 90 tablet 2   atorvastatin (LIPITOR) 40 MG tablet Take 1 tablet (40 mg total) by mouth daily. for cholesterol. 90 tablet 2   fenofibrate 160 MG tablet Take 1 tablet (160 mg total) by mouth daily. For cholesterol and triglyceride 90 tablet 2  glimepiride (AMARYL) 2 MG tablet Take 1 tablet by mouth once daily with breakfast 90 tablet 2   lisinopril-hydrochlorothiazide (ZESTORETIC) 20-25 MG tablet Take 1 tablet by mouth daily. 90 tablet 2   metFORMIN (GLUCOPHAGE-XR) 750 MG 24 hr tablet Take 1 tablet (750 mg total) by mouth 2 (two) times daily with a meal. 180 tablet 2   metoprolol tartrate (LOPRESSOR) 100 MG tablet Take 1 tablet (100 mg total) by mouth 2 (two) times daily. 180 tablet 2   ONETOUCH VERIO test strip Check glucose twice daily Dx E11.9 200 each 3   Current  Facility-Administered Medications  Medication Dose Route Frequency Provider Last Rate Last Admin   0.9 %  sodium chloride infusion  500 mL Intravenous Continuous Breylan Lefevers, Carie Caddy, MD        Allergies as of 02/26/2023   (No Known Allergies)    Family History  Problem Relation Age of Onset   Cancer Mother        lung   Cancer Father        prostate   Diabetes Father    Heart disease Father    Hyperlipidemia Father    Hypertension Father    Cancer Maternal Grandmother        breast   Colon cancer Neg Hx    Colon polyps Neg Hx    Esophageal cancer Neg Hx    Stomach cancer Neg Hx    Rectal cancer Neg Hx     Social History   Socioeconomic History   Marital status: Married    Spouse name: Not on file   Number of children: Not on file   Years of education: Not on file   Highest education level: Not on file  Occupational History   Not on file  Tobacco Use   Smoking status: Never   Smokeless tobacco: Never  Vaping Use   Vaping status: Never Used  Substance and Sexual Activity   Alcohol use: Yes    Comment: OCCAS BEER   Drug use: No   Sexual activity: Yes    Birth control/protection: Surgical  Other Topics Concern   Not on file  Social History Narrative   Not on file   Social Determinants of Health   Financial Resource Strain: Not on file  Food Insecurity: Not on file  Transportation Needs: Not on file  Physical Activity: Not on file  Stress: Not on file  Social Connections: Not on file  Intimate Partner Violence: Not on file    Physical Exam: Vital signs in last 24 hours: @BP  (!) 146/97   Pulse 79   Temp 98.4 F (36.9 C)   Ht 5\' 8"  (1.727 m)   Wt 240 lb (108.9 kg)   SpO2 97%   BMI 36.49 kg/m  GEN: NAD EYE: Sclerae anicteric ENT: MMM CV: Non-tachycardic Pulm: CTA b/l GI: Soft, NT/ND NEURO:  Alert & Oriented x 3   Erick Blinks, MD [facility]  02/26/2023 11:32 AM

## 2023-02-26 NOTE — Progress Notes (Signed)
Called to room to assist during endoscopic procedure.  Patient ID and intended procedure confirmed with present staff. Received instructions for my participation in the procedure from the performing physician.  

## 2023-02-26 NOTE — Patient Instructions (Signed)
Resume previous diet Continue present medications Await pathology results  Handouts/information given for polyps, diverticulosis and hemorrhoids  YOU HAD AN ENDOSCOPIC PROCEDURE TODAY AT THE [facility]:   Refer to the procedure report that was given to you for any specific questions about what was found during the examination.  If the procedure report does not answer your questions, please call your gastroenterologist to clarify.  If you requested that your care partner not be given the details of your procedure findings, then the procedure report has been included in a sealed envelope for you to review at your convenience later.  YOU SHOULD EXPECT: Some feelings of bloating in the abdomen. Passage of more gas than usual.  Walking can help get rid of the air that was put into your GI tract during the procedure and reduce the bloating. If you had a lower endoscopy (such as a colonoscopy or flexible sigmoidoscopy) you may notice spotting of blood in your stool or on the toilet paper. If you underwent a bowel prep for your procedure, you may not have a normal bowel movement for a few days.  Please Note:  You might notice some irritation and congestion in your nose or some drainage.  This is from the oxygen used during your procedure.  There is no need for concern and it should clear up in a day or so.  SYMPTOMS TO REPORT IMMEDIATELY:  Following lower endoscopy (colonoscopy):  Excessive amounts of blood in the stool  Significant tenderness or worsening of abdominal pains  Swelling of the abdomen that is new, acute  Fever of 100F or higher  For urgent or emergent issues, a gastroenterologist can be reached at any hour by calling [phone]. Do not use MyChart messaging for urgent concerns.   DIET:  We do recommend a small meal at first, but then you may proceed to your regular diet.  Drink plenty of fluids but you should avoid alcoholic beverages for 24 hours.  ACTIVITY:  You  should plan to take it easy for the rest of today and you should NOT DRIVE or use heavy machinery until tomorrow (because of the sedation medicines used during the test).    FOLLOW UP: Our staff will call the number listed on your records the next business day following your procedure.  We will call around 7:15- 8:00 am to check on you and address any questions or concerns that you may have regarding the information given to you following your procedure. If we do not reach you, we will leave a message.     If any biopsies were taken you will be contacted by phone or by letter within the next 1-3 weeks.  Please call us at [phone] if you have not heard about the biopsies in 3 weeks.    SIGNATURES/CONFIDENTIALITY: You and/or your care partner have signed paperwork which will be entered into your electronic medical record.  These signatures attest to the fact that that the information above on your After Visit Summary has been reviewed and is understood.  Full responsibility of the confidentiality of this discharge information lies with you and/or your care-partner. 

## 2023-02-26 NOTE — Progress Notes (Signed)
Report given to PACU, vss 

## 2023-02-26 NOTE — Op Note (Signed)
[facility] Patient Name: Adam Sutton Procedure Date: 02/26/2023 11:32 AM MRN: [id number] Endoscopist: Beverley Fiedler , MD, [id number] Age: 67 Referring MD:  Date of Birth: 11-20-1955 Gender: Male Account #: [id number] Procedure:                Colonoscopy Indications:              Screening for colorectal malignant neoplasm; last                            exam 20 years ago Medicines:                Monitored Anesthesia Care Procedure:                Pre-Anesthesia Assessment:                           - Prior to the procedure, a History and Physical                            was performed, and patient medications and                            allergies were reviewed. The patient's tolerance of                            previous anesthesia was also reviewed. The risks                            and benefits of the procedure and the sedation                            options and risks were discussed with the patient.                            All questions were answered, and informed consent                            was obtained. Prior Anticoagulants: The patient has                            taken no anticoagulant or antiplatelet agents. ASA                            Grade Assessment: II - A patient with mild systemic                            disease. After reviewing the risks and benefits,                            the patient was deemed in satisfactory condition to                            undergo the procedure.  After obtaining informed consent, the colonoscope                            was passed under direct vision. Throughout the                            procedure, the patient's blood pressure, pulse, and                            oxygen saturations were monitored continuously. The                            Olympus CF-HQ190L ([id number]) Colonoscope was                            introduced through the anus and advanced to  the                            cecum, identified by the appendiceal orifice,                            ileocecal valve and palpation. The colonoscopy was                            performed without difficulty. The patient tolerated                            the procedure well. The quality of the bowel                            preparation was good. The ileocecal valve,                            appendiceal orifice, and rectum were photographed. Scope In: 11:43:33 AM Scope Out: 12:03:22 PM Scope Withdrawal Time: 0 hours 17 minutes 46 seconds  Total Procedure Duration: 0 hours 19 minutes 49 seconds  Findings:                 The digital rectal exam was normal.                           Two sessile polyps were found in the cecum. The                            polyps were 2 to 3 mm in size. These polyps were                            removed with a cold snare. Resection and retrieval                            were complete.                           Two sessile polyps were found in the ascending  colon. The polyps were 4 to 5 mm in size. These                            polyps were removed with a cold snare. Resection                            and retrieval were complete.                           A 7 mm polyp was found in the transverse colon. The                            polyp was sessile. The polyp was removed with a                            cold snare. Resection and retrieval were complete.                           A 5 mm polyp was found in the descending colon. The                            polyp was sessile. The polyp was removed with a                            cold snare. Resection and retrieval were complete.                           Two sessile polyps were found in the sigmoid colon.                            The polyps were 5 to 7 mm in size. These polyps                            were removed with a cold snare. Resection and                             retrieval were complete.                           Multiple medium-mouthed and small-mouthed                            diverticula were found in the sigmoid colon and                            descending colon.                           Internal hemorrhoids were found during                            retroflexion. The hemorrhoids were small. Complications:            No  immediate complications. Estimated Blood Loss:     Estimated blood loss: none. Impression:               - Two 2 to 3 mm polyps in the cecum, removed with a                            cold snare. Resected and retrieved.                           - Two 4 to 5 mm polyps in the ascending colon,                            removed with a cold snare. Resected and retrieved.                           - One 7 mm polyp in the transverse colon, removed                            with a cold snare. Resected and retrieved.                           - One 5 mm polyp in the descending colon, removed                            with a cold snare. Resected and retrieved.                           - Two 5 to 7 mm polyps in the sigmoid colon,                            removed with a cold snare. Resected and retrieved.                           - Moderate diverticulosis in the sigmoid colon and                            in the descending colon.                           - Small internal hemorrhoids. Recommendation:           - Patient has a contact number available for                            emergencies. The signs and symptoms of potential                            delayed complications were discussed with the                            patient. Return to normal activities tomorrow.                            Written discharge  instructions were provided to the                            patient.                           - Resume previous diet.                           - Continue present medications.                            - Await pathology results.                           - Repeat colonoscopy is recommended for                            surveillance. The colonoscopy date will be                            determined after pathology results from today's                            exam become available for review. Beverley Fiedler, MD 02/26/2023 12:08:01 PM This report has been signed electronically.

## 2023-02-27 ENCOUNTER — Telehealth: Payer: Self-pay | Admitting: *Deleted

## 2023-02-27 NOTE — Telephone Encounter (Signed)
Post procedure follow up call placed, no answer and left VM.  

## 2023-03-03 ENCOUNTER — Encounter: Payer: Self-pay | Admitting: Internal Medicine

## 2023-03-03 LAB — SURGICAL PATHOLOGY

## 2023-03-12 ENCOUNTER — Ambulatory Visit: Payer: Medicare Other | Admitting: Family Medicine

## 2023-03-19 ENCOUNTER — Encounter: Payer: Self-pay | Admitting: Family Medicine

## 2023-03-19 ENCOUNTER — Ambulatory Visit: Payer: Medicare Other | Admitting: Family Medicine

## 2023-03-19 VITALS — BP 119/65 | HR 57 | Temp 97.6°F | Ht 68.0 in | Wt 238.8 lb

## 2023-03-19 DIAGNOSIS — E119 Type 2 diabetes mellitus without complications: Secondary | ICD-10-CM

## 2023-03-19 DIAGNOSIS — Z7984 Long term (current) use of oral hypoglycemic drugs: Secondary | ICD-10-CM

## 2023-03-19 DIAGNOSIS — E782 Mixed hyperlipidemia: Secondary | ICD-10-CM | POA: Diagnosis not present

## 2023-03-19 DIAGNOSIS — I10 Essential (primary) hypertension: Secondary | ICD-10-CM

## 2023-03-19 LAB — BAYER DCA HB A1C WAIVED: HB A1C (BAYER DCA - WAIVED): 8.3 % — ABNORMAL HIGH (ref 4.8–5.6)

## 2023-03-19 MED ORDER — RYBELSUS 3 MG PO TABS: 3.0000 mg | ORAL_TABLET | Freq: Every morning | ORAL | 0 refills | Status: AC

## 2023-03-19 NOTE — Progress Notes (Signed)
Subjective:  Patient ID: Adam Sutton,  male    DOB: Oct 23, 1955  Age: 67 y.o.    CC: Medical Management of Chronic Issues   HPI Adam Sutton presents for  follow-up of hypertension. Patient has no history of headache chest pain or shortness of breath or recent cough. Patient also denies symptoms of TIA such as numbness weakness lateralizing. Patient denies side effects from medication. States taking it regularly.  Patient also  in for follow-up of elevated cholesterol. Doing well without complaints on current medication. Denies side effects  including myalgia and arthralgia and nausea. Also in today for liver function testing. Currently no chest pain, shortness of breath or other cardiovascular related symptoms noted.  Follow-up of diabetes. Patient does check blood sugar at home occasionally. Readings run in 150s Patient denies symptoms such as excessive hunger or urinary frequency, excessive hunger, nausea. Cut out bread. Only 2 beers since last visit. Eating more vegetables, decreased starches.  No significant hypoglycemic spells noted. Medications reviewed. Pt reports taking them regularly. Pt. denies complication/adverse reaction today.    History Adam Sutton has a past medical history of Bleeding hemorrhoid (10/27/2017), Cancer [facility]), Cervical disc disorder with radiculopathy, Cervical stenosis of spine, Diabetes mellitus without complication (HCC), Hypertension, Incontinence, Lipidemia, Nerve pain due to spinal stenosis, Sleep apnea (10/14/2011), and Vertigo.   He has a past surgical history that includes Umbilical hernia repair (2003); bilateral jaw surgery; Robot assisted laparoscopic radical prostatectomy (10/22/2011); Prostate surgery; Vasectomy; Tonsillectomy; and Anterior cervical decomp/discectomy fusion (N/A, 04/13/2019).   His family history includes Cancer in his father, maternal grandmother, and mother; Diabetes in his father; Heart disease in his father;  Hyperlipidemia in his father; Hypertension in his father.He reports that he has never smoked. He has never used smokeless tobacco. He reports current alcohol use. He reports that he does not use drugs.  Current Outpatient Medications on File Prior to Visit  Medication Sig Dispense Refill   amLODipine (NORVASC) 5 MG tablet Take 1 tablet by mouth once daily for blood pressure 90 tablet 2   atorvastatin (LIPITOR) 40 MG tablet Take 1 tablet (40 mg total) by mouth daily. for cholesterol. 90 tablet 2   fenofibrate 160 MG tablet Take 1 tablet (160 mg total) by mouth daily. For cholesterol and triglyceride 90 tablet 2   glimepiride (AMARYL) 2 MG tablet Take 1 tablet by mouth once daily with breakfast 90 tablet 2   lisinopril-hydrochlorothiazide (ZESTORETIC) 20-25 MG tablet Take 1 tablet by mouth daily. 90 tablet 2   metFORMIN (GLUCOPHAGE-XR) 750 MG 24 hr tablet Take 1 tablet (750 mg total) by mouth 2 (two) times daily with a meal. 180 tablet 2   metoprolol tartrate (LOPRESSOR) 100 MG tablet Take 1 tablet (100 mg total) by mouth 2 (two) times daily. 180 tablet 2   ONETOUCH VERIO test strip Check glucose twice daily Dx E11.9 200 each 3   No current facility-administered medications on file prior to visit.    ROS Review of Systems  Constitutional:  Negative for fever.  Respiratory:  Negative for shortness of breath.   Cardiovascular:  Negative for chest pain.  Musculoskeletal:  Negative for arthralgias.  Skin:  Negative for rash.    Objective:  Ht 5\' 8"  (1.727 m)   BMI 36.49 kg/m   BP Readings from Last 3 Encounters:  02/26/23 (!) 160/84  12/22/22 (!) 161/115  12/10/22 132/72    Wt Readings from Last 3 Encounters:  02/26/23 240 lb (108.9 kg)  01/30/23 240 lb (108.9  kg)  12/22/22 250 lb (113.4 kg)     Physical Exam Vitals reviewed.  Constitutional:      Appearance: He is well-developed.  HENT:     Head: Normocephalic and atraumatic.     Right Ear: External ear normal.     Left  Ear: External ear normal.     Mouth/Throat:     Pharynx: No oropharyngeal exudate or posterior oropharyngeal erythema.  Eyes:     Pupils: Pupils are equal, round, and reactive to light.  Cardiovascular:     Rate and Rhythm: Normal rate and regular rhythm.     Heart sounds: No murmur heard. Pulmonary:     Effort: No respiratory distress.     Breath sounds: Normal breath sounds.  Musculoskeletal:     Cervical back: Normal range of motion and neck supple.  Neurological:     Mental Status: He is alert and oriented to person, place, and time.     Diabetic Foot Exam - Simple   No data filed     Lab Results  Component Value Date   HGBA1C 7.6 (H) 12/10/2022   HGBA1C 9.6 (H) 02/27/2022   HGBA1C 7.3 (H) 01/16/2021    Assessment & Plan:   Adam Sutton was seen today for medical management of chronic issues.  Diagnoses and all orders for this visit:  Diabetes mellitus without complication (HCC) -     Bayer DCA Hb A1c Waived  Essential hypertension -     CBC with Differential/Platelet -     CMP14+EGFR  Mixed hyperlipidemia -     Lipid panel   I am having Adam Sutton maintain his OneTouch Verio, amLODipine, atorvastatin, fenofibrate, glimepiride, lisinopril-hydrochlorothiazide, metFORMIN, and metoprolol tartrate.  No orders of the defined types were placed in this encounter.    Follow-up: No follow-ups on file.  Mechele Claude, M.D.

## 2023-03-20 LAB — CMP14+EGFR
ALT: 13 [IU]/L (ref 0–44)
AST: 18 [IU]/L (ref 0–40)
Albumin: 4.3 g/dL (ref 3.9–4.9)
Alkaline Phosphatase: 96 [IU]/L (ref 44–121)
BUN/Creatinine Ratio: 21 (ref 10–24)
BUN: 22 mg/dL (ref 8–27)
Bilirubin Total: 0.7 mg/dL (ref 0.0–1.2)
CO2: 22 mmol/L (ref 20–29)
Calcium: 9.8 mg/dL (ref 8.6–10.2)
Chloride: 98 mmol/L (ref 96–106)
Creatinine, Ser: 1.04 mg/dL (ref 0.76–1.27)
Globulin, Total: 2.7 g/dL (ref 1.5–4.5)
Glucose: 181 mg/dL — ABNORMAL HIGH (ref 70–99)
Potassium: 3.8 mmol/L (ref 3.5–5.2)
Sodium: 140 mmol/L (ref 134–144)
Total Protein: 7 g/dL (ref 6.0–8.5)
eGFR: 78 mL/min/{1.73_m2} (ref >59)

## 2023-03-20 LAB — CBC WITH DIFFERENTIAL/PLATELET
Basophils Absolute: 0.1 10*3/uL (ref 0.0–0.2)
Basos: 1 %
EOS (ABSOLUTE): 0.2 10*3/uL (ref 0.0–0.4)
Eos: 2 %
Hematocrit: 45.5 % (ref 37.5–51.0)
Hemoglobin: 14.5 g/dL (ref 13.0–17.7)
Immature Grans (Abs): 0 10*3/uL (ref 0.0–0.1)
Immature Granulocytes: 0 %
Lymphocytes Absolute: 2 10*3/uL (ref 0.7–3.1)
Lymphs: 27 %
MCH: 27.1 pg (ref 26.6–33.0)
MCHC: 31.9 g/dL (ref 31.5–35.7)
MCV: 85 fL (ref 79–97)
Monocytes Absolute: 0.7 10*3/uL (ref 0.1–0.9)
Monocytes: 10 %
Neutrophils Absolute: 4.4 10*3/uL (ref 1.4–7.0)
Neutrophils: 60 %
Platelets: 314 10*3/uL (ref 150–450)
RBC: 5.36 x10E6/uL (ref 4.14–5.80)
RDW: 13.8 % (ref 11.6–15.4)
WBC: 7.4 10*3/uL (ref 3.4–10.8)

## 2023-03-20 LAB — LIPID PANEL
Chol/HDL Ratio: 3.8 {ratio} (ref 0.0–5.0)
Cholesterol, Total: 110 mg/dL (ref 100–199)
HDL: 29 mg/dL — ABNORMAL LOW (ref >39)
LDL Chol Calc (NIH): 51 mg/dL (ref 0–99)
Triglycerides: 178 mg/dL — ABNORMAL HIGH (ref 0–149)
VLDL Cholesterol Cal: 30 mg/dL (ref 5–40)

## 2023-03-23 NOTE — Progress Notes (Signed)
Hello Adam Sutton,  Your lab result is normal and/or stable.Some minor variations that are not significant are commonly marked abnormal, but do not represent any medical problem for you.  Best regards, Bradley Handyside, M.D.

## 2023-06-22 ENCOUNTER — Ambulatory Visit: Payer: Medicare Other | Admitting: Family Medicine

## 2023-07-27 ENCOUNTER — Ambulatory Visit: Payer: Medicare Other | Admitting: Family Medicine

## 2023-07-27 VITALS — BP 150/82 | HR 64 | Temp 97.5°F | Wt 245.0 lb

## 2023-07-27 DIAGNOSIS — E782 Mixed hyperlipidemia: Secondary | ICD-10-CM

## 2023-07-27 DIAGNOSIS — E119 Type 2 diabetes mellitus without complications: Secondary | ICD-10-CM | POA: Diagnosis not present

## 2023-07-27 DIAGNOSIS — Z7984 Long term (current) use of oral hypoglycemic drugs: Secondary | ICD-10-CM

## 2023-07-27 DIAGNOSIS — I10 Essential (primary) hypertension: Secondary | ICD-10-CM

## 2023-07-27 LAB — LIPID PANEL

## 2023-07-27 LAB — BAYER DCA HB A1C WAIVED: HB A1C (BAYER DCA - WAIVED): 8.2 % — ABNORMAL HIGH (ref 4.8–5.6)

## 2023-07-28 LAB — LIPID PANEL
Cholesterol, Total: 154 mg/dL (ref 100–199)
HDL: 27 mg/dL — ABNORMAL LOW (ref >39)
LDL CALC COMMENT:: 5.7 ratio — ABNORMAL HIGH (ref 0.0–5.0)
LDL Chol Calc (NIH): 79 mg/dL (ref 0–99)
Triglycerides: 295 mg/dL — ABNORMAL HIGH (ref 0–149)
VLDL Cholesterol Cal: 48 mg/dL — ABNORMAL HIGH (ref 5–40)

## 2023-07-28 LAB — CMP14+EGFR
ALT: 20 IU/L (ref 0–44)
AST: 21 IU/L (ref 0–40)
Albumin: 4 g/dL (ref 3.9–4.9)
Alkaline Phosphatase: 81 IU/L (ref 44–121)
BUN/Creatinine Ratio: 23 (ref 10–24)
BUN: 22 mg/dL (ref 8–27)
Bilirubin Total: 0.5 mg/dL (ref 0.0–1.2)
CO2: 23 mmol/L (ref 20–29)
Calcium: 9.6 mg/dL (ref 8.6–10.2)
Chloride: 98 mmol/L (ref 96–106)
Creatinine, Ser: 0.97 mg/dL (ref 0.76–1.27)
Globulin, Total: 2.5 g/dL (ref 1.5–4.5)
Glucose: 115 mg/dL — ABNORMAL HIGH (ref 70–99)
Potassium: 3.6 mmol/L (ref 3.5–5.2)
Sodium: 139 mmol/L (ref 134–144)
Total Protein: 6.5 g/dL (ref 6.0–8.5)
eGFR: 85 mL/min/{1.73_m2} (ref >59)

## 2023-07-28 LAB — CBC WITH DIFFERENTIAL/PLATELET
Basophils Absolute: 0.1 10*3/uL (ref 0.0–0.2)
Basos: 1 %
EOS (ABSOLUTE): 0.2 10*3/uL (ref 0.0–0.4)
Eos: 2 %
Hematocrit: 43.6 % (ref 37.5–51.0)
Hemoglobin: 13.7 g/dL (ref 13.0–17.7)
Immature Grans (Abs): 0 10*3/uL (ref 0.0–0.1)
Immature Granulocytes: 0 %
Lymphocytes Absolute: 2 10*3/uL (ref 0.7–3.1)
Lymphs: 27 %
MCH: 26.9 pg (ref 26.6–33.0)
MCHC: 31.4 g/dL — ABNORMAL LOW (ref 31.5–35.7)
MCV: 86 fL (ref 79–97)
Monocytes Absolute: 0.8 10*3/uL (ref 0.1–0.9)
Monocytes: 11 %
Neutrophils Absolute: 4.2 10*3/uL (ref 1.4–7.0)
Neutrophils: 59 %
Platelets: 310 10*3/uL (ref 150–450)
RBC: 5.1 x10E6/uL (ref 4.14–5.80)
RDW: 14.2 % (ref 11.6–15.4)
WBC: 7.3 10*3/uL (ref 3.4–10.8)

## 2023-07-31 ENCOUNTER — Encounter: Payer: Self-pay | Admitting: Family Medicine

## 2023-07-31 MED ORDER — ATORVASTATIN CALCIUM 40 MG PO TABS: 40.0000 mg | ORAL_TABLET | Freq: Every day | ORAL | 2 refills | Status: DC

## 2023-07-31 MED ORDER — GLIMEPIRIDE 2 MG PO TABS: ORAL_TABLET | ORAL | 2 refills | Status: DC

## 2023-07-31 MED ORDER — LISINOPRIL-HYDROCHLOROTHIAZIDE 20-25 MG PO TABS: 1.0000 | ORAL_TABLET | Freq: Every day | ORAL | 2 refills | Status: DC

## 2023-07-31 MED ORDER — METOPROLOL TARTRATE 100 MG PO TABS: 100.0000 mg | ORAL_TABLET | Freq: Two times a day (BID) | ORAL | 2 refills | Status: DC

## 2023-07-31 MED ORDER — AMLODIPINE BESYLATE 5 MG PO TABS: ORAL_TABLET | ORAL | 2 refills | Status: DC

## 2023-07-31 MED ORDER — FENOFIBRATE 160 MG PO TABS: 160.0000 mg | ORAL_TABLET | Freq: Every day | ORAL | 2 refills | Status: DC

## 2023-07-31 MED ORDER — GLIMEPIRIDE 2 MG PO TABS
2.0000 mg | ORAL_TABLET | Freq: Two times a day (BID) | ORAL | 3 refills | Status: AC
Start: 2023-07-31 — End: ?

## 2023-07-31 MED ORDER — METFORMIN HCL ER 750 MG PO TB24: 750.0000 mg | ORAL_TABLET | Freq: Two times a day (BID) | ORAL | 2 refills | Status: DC

## 2023-07-31 NOTE — Progress Notes (Signed)
Subjective:  Patient ID: Adam Sutton,  male    DOB: [birth year]  Age: 68 y.o.    CC: Medical Management of Chronic Issues (No concerns at this time/Foot exam)   HPI Adam Sutton presents for  follow-up of hypertension. Patient has no history of headache chest pain or shortness of breath or recent cough. Patient also denies symptoms of TIA such as numbness weakness lateralizing. Patient denies side effects from medication. States taking it regularly.  Patient also  in for follow-up of elevated cholesterol. Doing well without complaints on current medication. Denies side effects  including myalgia and arthralgia and nausea. Also in today for liver function testing. Currently no chest pain, shortness of breath or other cardiovascular related symptoms noted.  Follow-up of diabetes. Patient does check blood sugar at home. Readings run between 150 and 200 Patient denies symptoms such as excessive hunger or urinary frequency, excessive hunger, nausea No significant hypoglycemic spells noted. Medications reviewed. Pt reports taking them regularly. Pt. denies complication/adverse reaction today.    History Adam Sutton has a past medical history of Bleeding hemorrhoid (10/27/2017), Cancer [facility]), Cervical disc disorder with radiculopathy, Cervical stenosis of spine, Diabetes mellitus without complication (HCC), Hypertension, Incontinence, Lipidemia, Nerve pain due to spinal stenosis, Sleep apnea (10/14/2011), and Vertigo.   He has a past surgical history that includes Umbilical hernia repair (2003); bilateral jaw surgery; Robot assisted laparoscopic radical prostatectomy (10/22/2011); Prostate surgery; Vasectomy; Tonsillectomy; and Anterior cervical decomp/discectomy fusion (N/A, 04/13/2019).   His family history includes Cancer in his father, maternal grandmother, and mother; Diabetes in his father; Heart disease in his father; Hyperlipidemia in his father; Hypertension in his father.He reports  that he has never smoked. He has never used smokeless tobacco. He reports current alcohol use. He reports that he does not use drugs.  Current Outpatient Medications on File Prior to Visit  Medication Sig Dispense Refill   ONETOUCH VERIO test strip Check glucose twice daily Dx E11.9 200 each 3   No current facility-administered medications on file prior to visit.    ROS Review of Systems  Constitutional:  Negative for fever.  Respiratory:  Negative for shortness of breath.   Cardiovascular:  Negative for chest pain.  Musculoskeletal:  Negative for arthralgias.  Skin:  Negative for rash.    Objective:  BP (!) 150/82   Pulse 64   Temp (!) 97.5 F (36.4 C)   Wt 245 lb (111.1 kg)   SpO2 98%   BMI 37.25 kg/m   BP Readings from Last 3 Encounters:  07/27/23 (!) 150/82  03/19/23 119/65  02/26/23 (!) 160/84    Wt Readings from Last 3 Encounters:  07/27/23 245 lb (111.1 kg)  03/19/23 238 lb 12.8 oz (108.3 kg)  02/26/23 240 lb (108.9 kg)     Physical Exam Vitals reviewed.  Constitutional:      Appearance: He is well-developed.  HENT:     Head: Normocephalic and atraumatic.     Right Ear: External ear normal.     Left Ear: External ear normal.     Mouth/Throat:     Pharynx: No oropharyngeal exudate or posterior oropharyngeal erythema.  Eyes:     Pupils: Pupils are equal, round, and reactive to light.  Cardiovascular:     Rate and Rhythm: Normal rate and regular rhythm.     Heart sounds: No murmur heard. Pulmonary:     Effort: No respiratory distress.     Breath sounds: Normal breath sounds.  Musculoskeletal:  Cervical back: Normal range of motion and neck supple.  Neurological:     Mental Status: He is alert and oriented to person, place, and time.     Diabetic Foot Exam - Simple   No data filed     Lab Results  Component Value Date   HGBA1C 8.2 (H) 07/27/2023   HGBA1C 8.3 (H) 03/19/2023   HGBA1C 7.6 (H) 12/10/2022    Assessment & Plan:   Adam Sutton  was seen today for medical management of chronic issues.  Diagnoses and all orders for this visit:  Diabetes mellitus without complication (HCC) -     Bayer DCA Hb A1c Waived -     Discontinue: glimepiride (AMARYL) 2 MG tablet; Take 1 tablet by mouth once daily with breakfast -     metFORMIN (GLUCOPHAGE-XR) 750 MG 24 hr tablet; Take 1 tablet (750 mg total) by mouth 2 (two) times daily with a meal. -     glimepiride (AMARYL) 2 MG tablet; Take 1 tablet (2 mg total) by mouth in the morning and at bedtime.  Essential hypertension -     CBC with Differential/Platelet -     CMP14+EGFR -     amLODipine (NORVASC) 5 MG tablet; Take 1 tablet by mouth once daily for blood pressure -     lisinopril-hydrochlorothiazide (ZESTORETIC) 20-25 MG tablet; Take 1 tablet by mouth daily. -     metoprolol tartrate (LOPRESSOR) 100 MG tablet; Take 1 tablet (100 mg total) by mouth 2 (two) times daily.  Mixed hyperlipidemia -     CMP14+EGFR -     Lipid panel -     atorvastatin (LIPITOR) 40 MG tablet; Take 1 tablet (40 mg total) by mouth daily. for cholesterol. -     fenofibrate 160 MG tablet; Take 1 tablet (160 mg total) by mouth daily. For cholesterol and triglyceride   I have discontinued Adam Sutton glimepiride. I have also changed his glimepiride. Additionally, I am having him maintain his OneTouch Verio, amLODipine, atorvastatin, fenofibrate, lisinopril-hydrochlorothiazide, metFORMIN, and metoprolol tartrate.     Follow-up: Return in about 3 months (around 10/24/2023) for diabetes.  Mechele Claude, M.D.

## 2023-10-21 ENCOUNTER — Encounter (HOSPITAL_COMMUNITY): Payer: Self-pay

## 2023-10-28 ENCOUNTER — Ambulatory Visit: Payer: Medicare Other | Admitting: Family Medicine

## 2023-10-28 ENCOUNTER — Encounter: Payer: Self-pay | Admitting: Family Medicine

## 2023-10-28 VITALS — BP 139/84 | HR 60 | Temp 97.3°F | Ht 68.0 in | Wt 244.4 lb

## 2023-10-28 DIAGNOSIS — R109 Unspecified abdominal pain: Secondary | ICD-10-CM

## 2023-10-28 DIAGNOSIS — I10 Essential (primary) hypertension: Secondary | ICD-10-CM | POA: Diagnosis not present

## 2023-10-28 DIAGNOSIS — Z6837 Body mass index (BMI) 37.0-37.9, adult: Secondary | ICD-10-CM | POA: Diagnosis not present

## 2023-10-28 DIAGNOSIS — G8929 Other chronic pain: Secondary | ICD-10-CM

## 2023-10-28 DIAGNOSIS — E782 Mixed hyperlipidemia: Secondary | ICD-10-CM | POA: Diagnosis not present

## 2023-10-28 DIAGNOSIS — I152 Hypertension secondary to endocrine disorders: Secondary | ICD-10-CM

## 2023-10-28 DIAGNOSIS — E119 Type 2 diabetes mellitus without complications: Secondary | ICD-10-CM

## 2023-10-28 DIAGNOSIS — E1159 Type 2 diabetes mellitus with other circulatory complications: Secondary | ICD-10-CM

## 2023-10-28 LAB — CMP14+EGFR
ALT: 14 IU/L (ref 0–44)
AST: 18 IU/L (ref 0–40)
Albumin: 4.1 g/dL (ref 3.9–4.9)
Alkaline Phosphatase: 88 IU/L (ref 44–121)
BUN/Creatinine Ratio: 19 (ref 10–24)
BUN: 22 mg/dL (ref 8–27)
Bilirubin Total: 0.5 mg/dL (ref 0.0–1.2)
CO2: 27 mmol/L (ref 20–29)
Calcium: 10.3 mg/dL — ABNORMAL HIGH (ref 8.6–10.2)
Chloride: 95 mmol/L — ABNORMAL LOW (ref 96–106)
Creatinine, Ser: 1.13 mg/dL (ref 0.76–1.27)
Globulin, Total: 2.4 g/dL (ref 1.5–4.5)
Glucose: 194 mg/dL — ABNORMAL HIGH (ref 70–99)
Potassium: 3.8 mmol/L (ref 3.5–5.2)
Sodium: 136 mmol/L (ref 134–144)
Total Protein: 6.5 g/dL (ref 6.0–8.5)
eGFR: 70 mL/min/{1.73_m2} (ref >59)

## 2023-10-28 LAB — CBC WITH DIFFERENTIAL/PLATELET
Basophils Absolute: 0.1 10*3/uL (ref 0.0–0.2)
Basos: 1 %
EOS (ABSOLUTE): 0.2 10*3/uL (ref 0.0–0.4)
Eos: 2 %
Hematocrit: 45.8 % (ref 37.5–51.0)
Hemoglobin: 14.2 g/dL (ref 13.0–17.7)
Immature Grans (Abs): 0 10*3/uL (ref 0.0–0.1)
Immature Granulocytes: 0 %
Lymphocytes Absolute: 2.6 10*3/uL (ref 0.7–3.1)
Lymphs: 30 %
MCH: 27 pg (ref 26.6–33.0)
MCHC: 31 g/dL — ABNORMAL LOW (ref 31.5–35.7)
MCV: 87 fL (ref 79–97)
Monocytes Absolute: 0.9 10*3/uL (ref 0.1–0.9)
Monocytes: 10 %
Neutrophils Absolute: 4.9 10*3/uL (ref 1.4–7.0)
Neutrophils: 57 %
Platelets: 308 10*3/uL (ref 150–450)
RBC: 5.26 x10E6/uL (ref 4.14–5.80)
RDW: 13.4 % (ref 11.6–15.4)
WBC: 8.7 10*3/uL (ref 3.4–10.8)

## 2023-10-28 LAB — LIPID PANEL
Chol/HDL Ratio: 3.1 ratio (ref 0.0–5.0)
Cholesterol, Total: 94 mg/dL — ABNORMAL LOW (ref 100–199)
HDL: 30 mg/dL — ABNORMAL LOW (ref >39)
LDL Chol Calc (NIH): 33 mg/dL (ref 0–99)
Triglycerides: 193 mg/dL — ABNORMAL HIGH (ref 0–149)
VLDL Cholesterol Cal: 31 mg/dL (ref 5–40)

## 2023-10-28 LAB — BAYER DCA HB A1C WAIVED: HB A1C (BAYER DCA - WAIVED): 7.7 % — ABNORMAL HIGH (ref 4.8–5.6)

## 2023-10-28 NOTE — Progress Notes (Signed)
 Subjective:  Patient ID: Adam Sutton, male    DOB: 10-01-1955  Age: 68 y.o. MRN: [id number]  CC: Medical Management of Chronic Issues (6 month follow up ) and Back Pain (Patient states he still has the right sided lower back pain on and off. )   HPI Adam Sutton presents forFollow-up of diabetes. Patient checks blood sugar at home.  Patient denies symptoms such as polyuria, polydipsia, excessive hunger, nausea No significant hypoglycemic spells noted. Medications reviewed. Pt reports taking them regularly without complication/adverse reaction being reported today.   Right lower back pain occurring randomly. Hits hard, but only lasts 10 seconds.   Seeing neurosurgery, Dr. Waymond Hailey for left arm pain. Getting MRI neck next month.    History Adam Sutton has a past medical history of Bleeding hemorrhoid (10/27/2017), Cancer [facility]), Cervical disc disorder with radiculopathy, Cervical stenosis of spine, Diabetes mellitus without complication (HCC), Hypertension, Incontinence, Lipidemia, Nerve pain due to spinal stenosis, Sleep apnea (10/14/2011), and Vertigo.   He has a past surgical history that includes Umbilical hernia repair (2003); bilateral jaw surgery; Robot assisted laparoscopic radical prostatectomy (10/22/2011); Prostate surgery; Vasectomy; Tonsillectomy; and Anterior cervical decomp/discectomy fusion (N/A, 04/13/2019).   His family history includes Cancer in his father, maternal grandmother, and mother; Diabetes in his father; Heart disease in his father; Hyperlipidemia in his father; Hypertension in his father.He reports that he has never smoked. He has never used smokeless tobacco. He reports current alcohol use. He reports that he does not use drugs.  Current Outpatient Medications on File Prior to Visit  Medication Sig Dispense Refill   amLODipine  (NORVASC ) 5 MG tablet Take 1 tablet by mouth once daily for blood pressure 90 tablet 2   atorvastatin  (LIPITOR) 40 MG tablet  Take 1 tablet (40 mg total) by mouth daily. for cholesterol. 90 tablet 2   fenofibrate  160 MG tablet Take 1 tablet (160 mg total) by mouth daily. For cholesterol and triglyceride 90 tablet 2   glimepiride  (AMARYL ) 2 MG tablet Take 1 tablet (2 mg total) by mouth in the morning and at bedtime. 180 tablet 3   lisinopril -hydrochlorothiazide  (ZESTORETIC ) 20-25 MG tablet Take 1 tablet by mouth daily. 90 tablet 2   metFORMIN  (GLUCOPHAGE -XR) 750 MG 24 hr tablet Take 1 tablet (750 mg total) by mouth 2 (two) times daily with a meal. 180 tablet 2   metoprolol  tartrate (LOPRESSOR ) 100 MG tablet Take 1 tablet (100 mg total) by mouth 2 (two) times daily. 180 tablet 2   ONETOUCH VERIO test strip Check glucose twice daily Dx E11.9 200 each 3   No current facility-administered medications on file prior to visit.    ROS Review of Systems  Constitutional: Negative.   HENT: Negative.    Eyes:  Negative for visual disturbance.  Respiratory:  Negative for cough and shortness of breath.   Cardiovascular:  Negative for chest pain and leg swelling.  Gastrointestinal:  Negative for abdominal pain, diarrhea, nausea and vomiting.  Genitourinary:  Negative for difficulty urinating.  Musculoskeletal:  Positive for arthralgias (left arm, shoulder, with numbness). Negative for myalgias.  Skin:  Negative for rash.  Neurological:  Positive for numbness (left arm). Negative for headaches.  Psychiatric/Behavioral:  Negative for sleep disturbance.     Objective:  BP 139/84   Pulse 60   Temp (!) 97.3 F (36.3 C)   Ht 5\' 8"  (1.727 m)   Wt 244 lb 6.4 oz (110.9 kg)   SpO2 95%   BMI 37.16 kg/m   BP  Readings from Last 3 Encounters:  10/28/23 139/84  07/27/23 (!) 150/82  03/19/23 119/65    Wt Readings from Last 3 Encounters:  10/28/23 244 lb 6.4 oz (110.9 kg)  07/27/23 245 lb (111.1 kg)  03/19/23 238 lb 12.8 oz (108.3 kg)    Lab Results  Component Value Date   HGBA1C 7.7 (H) 10/28/2023   HGBA1C 8.2 (H)  07/27/2023   HGBA1C 8.3 (H) 03/19/2023    Physical Exam Vitals reviewed.  Constitutional:      Appearance: He is well-developed.  HENT:     Head: Normocephalic and atraumatic.     Right Ear: External ear normal.     Left Ear: External ear normal.     Mouth/Throat:     Pharynx: No oropharyngeal exudate or posterior oropharyngeal erythema.  Eyes:     Pupils: Pupils are equal, round, and reactive to light.  Cardiovascular:     Rate and Rhythm: Normal rate and regular rhythm.     Heart sounds: No murmur heard. Pulmonary:     Effort: No respiratory distress.     Breath sounds: Normal breath sounds.  Musculoskeletal:     Cervical back: Normal range of motion and neck supple.  Neurological:     Mental Status: He is alert and oriented to person, place, and time.      Diabetic foot exam was performed with the following findings:   No deformities, ulcerations, or other skin breakdown Normal sensation of 10g monofilament Intact posterior tibialis and dorsalis pedis pulses Slight decrease in right DP pulse     Assessment & Plan:  Essential hypertension -     CBC with Differential/Platelet  Hypertension associated with diabetes (HCC) -     Bayer DCA Hb A1c Waived -     Microalbumin / creatinine urine ratio  Mixed hyperlipidemia -     Lipid panel  BMI 37.0-37.9, adult -     CBC with Differential/Platelet -     CMP14+EGFR -     Lipid panel  Primary hypertension -     CBC with Differential/Platelet -     Microalbumin / creatinine urine ratio  Chronic right flank pain -     CT ABDOMEN PELVIS W CONTRAST; Future      Follow-up: Return in about 3 months (around 01/28/2024).  Roise Cleaver, M.D.

## 2023-11-01 ENCOUNTER — Ambulatory Visit: Payer: Self-pay | Admitting: Family Medicine

## 2023-11-16 ENCOUNTER — Other Ambulatory Visit: Payer: Self-pay | Admitting: Neurological Surgery

## 2023-11-17 ENCOUNTER — Ambulatory Visit

## 2023-12-02 ENCOUNTER — Other Ambulatory Visit (HOSPITAL_COMMUNITY)

## 2023-12-02 NOTE — Pre-Procedure Instructions (Signed)
 Surgical Instructions   Your procedure is scheduled on Monday, June 30th. Report to [facility] Main Entrance A at 11:00 A.M., then check in with the Admitting office. Any questions or running late day of surgery: call [phone]  Questions prior to your surgery date: call [phone], Monday-Friday, 8am-4pm. If you experience any cold or flu symptoms such as cough, fever, chills, shortness of breath, etc. between now and your scheduled surgery, please notify us  at the above number.     Remember:  Do not eat after midnight the night before your surgery  You may drink clear liquids until 10:00 AM the morning of your surgery.   Clear liquids allowed are: Water , Non-Citrus Juices (without pulp), Carbonated Beverages, Clear Tea (no milk, honey, etc.), Black Coffee Only (NO MILK, CREAM OR POWDERED CREAMER of any kind), and Gatorade.    Take these medicines the morning of surgery with A SIP OF WATER   amLODipine  (NORVASC )  atorvastatin  (LIPITOR)  fenofibrate   metoprolol  tartrate (LOPRESSOR )    One week prior to surgery, STOP taking any Aspirin (unless otherwise instructed by your surgeon) Aleve, Naproxen, Ibuprofen, Motrin, Advil, Goody's, BC's, all herbal medications, fish oil, and non-prescription vitamins.  WHAT DO I DO ABOUT MY DIABETES MEDICATION?   Do not take metFORMIN  (GLUCOPHAGE -XR) the morning of surgery.  Do not take glimepiride  (AMARYL ) the night before surgery OR the morning of surgery.    HOW TO MANAGE YOUR DIABETES BEFORE AND AFTER SURGERY  Why is it important to control my blood sugar before and after surgery? Improving blood sugar levels before and after surgery helps healing and can limit problems. A way of improving blood sugar control is eating a healthy diet by:  Eating less sugar and carbohydrates  Increasing activity/exercise  Talking with your doctor about reaching your blood sugar goals High blood sugars (greater than 180 mg/dL) can raise your risk of  infections and slow your recovery, so you will need to focus on controlling your diabetes during the weeks before surgery. Make sure that the doctor who takes care of your diabetes knows about your planned surgery including the date and location.  How do I manage my blood sugar before surgery? Check your blood sugar at least 4 times a day, starting 2 days before surgery, to make sure that the level is not too high or low.  Check your blood sugar the morning of your surgery when you wake up and every 2 hours until you get to the Short Stay unit.  If your blood sugar is less than 70 mg/dL, you will need to treat for low blood sugar: Do not take insulin . Treat a low blood sugar (less than 70 mg/dL) with  cup of clear juice (cranberry or apple), 4 glucose tablets, OR glucose gel. Recheck blood sugar in 15 minutes after treatment (to make sure it is greater than 70 mg/dL). If your blood sugar is not greater than 70 mg/dL on recheck, call [phone] for further instructions. Report your blood sugar to the short stay nurse when you get to Short Stay.  If you are admitted to the hospital after surgery: Your blood sugar will be checked by the staff and you will probably be given insulin  after surgery (instead of oral diabetes medicines) to make sure you have good blood sugar levels. The goal for blood sugar control after surgery is 80-180 mg/dL.                     Do NOT Smoke (  Tobacco/Vaping) for 24 hours prior to your procedure.  If you use a CPAP at night, you may bring your mask/headgear for your overnight stay.   You will be asked to remove any contacts, glasses, piercing's, hearing aid's, dentures/partials prior to surgery. Please bring cases for these items if needed.    Patients discharged the day of surgery will not be allowed to drive home, and someone needs to stay with them for 24 hours.  SURGICAL WAITING ROOM VISITATION Patients may have no more than 2 support people in the waiting  area - these visitors may rotate.   Pre-op nurse will coordinate an appropriate time for 1 ADULT support person, who may not rotate, to accompany patient in pre-op.  Children under the age of 54 must have an adult with them who is not the patient and must remain in the main waiting area with an adult.  If the patient needs to stay at the hospital during part of their recovery, the visitor guidelines for inpatient rooms apply.  Please refer to the [facility] website for the visitor guidelines for any additional information.   If you received a COVID test during your pre-op visit  it is requested that you wear a mask when out in public, stay away from anyone that may not be feeling well and notify your surgeon if you develop symptoms. If you have been in contact with anyone that has tested positive in the last 10 days please notify you surgeon.      Pre-operative 5 CHG Bathing Instructions   You can play a key role in reducing the risk of infection after surgery. Your skin needs to be as free of germs as possible. You can reduce the number of germs on your skin by washing with CHG (chlorhexidine  gluconate) soap before surgery. CHG is an antiseptic soap that kills germs and continues to kill germs even after washing.   DO NOT use if you have an allergy to chlorhexidine /CHG or antibacterial soaps. If your skin becomes reddened or irritated, stop using the CHG and notify one of our RNs at [phone].   Please shower with the CHG soap starting 4 days before surgery using the following schedule:     Please keep in mind the following:  DO NOT shave, including legs and underarms, starting the day of your first shower.   You may shave your face at any point before/day of surgery.  Place clean sheets on your bed the day you start using CHG soap. Use a clean washcloth (not used since being washed) for each shower. DO NOT sleep with pets once you start using the CHG.   CHG Shower Instructions:   Wash your face and private area with normal soap. If you choose to wash your hair, wash first with your normal shampoo.  After you use shampoo/soap, rinse your hair and body thoroughly to remove shampoo/soap residue.  Turn the water  OFF and apply about 3 tablespoons (45 ml) of CHG soap to a CLEAN washcloth.  Apply CHG soap ONLY FROM YOUR NECK DOWN TO YOUR TOES (washing for 3-5 minutes)  DO NOT use CHG soap on face, private areas, open wounds, or sores.  Pay special attention to the area where your surgery is being performed.  If you are having back surgery, having someone wash your back for you may be helpful. Wait 2 minutes after CHG soap is applied, then you may rinse off the CHG soap.  Pat dry with a clean towel  Put on clean clothes/pajamas   If you choose to wear lotion, please use ONLY the CHG-compatible lotions that are listed below.  Additional instructions for the day of surgery: DO NOT APPLY any lotions, deodorants, cologne, or perfumes.   Do not bring valuables to the hospital. [facility] is not responsible for any belongings/valuables. Do not wear nail polish, gel polish, artificial nails, or any other type of covering on natural nails (fingers and toes) Do not wear jewelry or makeup Put on clean/comfortable clothes.  Please brush your teeth.  Ask your nurse before applying any prescription medications to the skin.     CHG Compatible Lotions   Aveeno Moisturizing lotion  Cetaphil Moisturizing Cream  Cetaphil Moisturizing Lotion  Clairol Herbal Essence Moisturizing Lotion, Dry Skin  Clairol Herbal Essence Moisturizing Lotion, Extra Dry Skin  Clairol Herbal Essence Moisturizing Lotion, Normal Skin  Curel Age Defying Therapeutic Moisturizing Lotion with Alpha Hydroxy  Curel Extreme Care Body Lotion  Curel Soothing Hands Moisturizing Hand Lotion  Curel Therapeutic Moisturizing Cream, Fragrance-Free  Curel Therapeutic Moisturizing Lotion, Fragrance-Free  Curel  Therapeutic Moisturizing Lotion, Original Formula  Eucerin Daily Replenishing Lotion  Eucerin Dry Skin Therapy Plus Alpha Hydroxy Crme  Eucerin Dry Skin Therapy Plus Alpha Hydroxy Lotion  Eucerin Original Crme  Eucerin Original Lotion  Eucerin Plus Crme Eucerin Plus Lotion  Eucerin TriLipid Replenishing Lotion  Keri Anti-Bacterial Hand Lotion  Keri Deep Conditioning Original Lotion Dry Skin Formula Softly Scented  Keri Deep Conditioning Original Lotion, Fragrance Free Sensitive Skin Formula  Keri Lotion Fast Absorbing Fragrance Free Sensitive Skin Formula  Keri Lotion Fast Absorbing Softly Scented Dry Skin Formula  Keri Original Lotion  Keri Skin Renewal Lotion Keri Silky Smooth Lotion  Keri Silky Smooth Sensitive Skin Lotion  Nivea Body Creamy Conditioning Oil  Nivea Body Extra Enriched Lotion  Nivea Body Original Lotion  Nivea Body Sheer Moisturizing Lotion Nivea Crme  Nivea Skin Firming Lotion  NutraDerm 30 Skin Lotion  NutraDerm Skin Lotion  NutraDerm Therapeutic Skin Cream  NutraDerm Therapeutic Skin Lotion  ProShield Protective Hand Cream  Provon moisturizing lotion  Please read over the following fact sheets that you were given.

## 2023-12-03 ENCOUNTER — Encounter (HOSPITAL_COMMUNITY): Payer: Self-pay

## 2023-12-03 ENCOUNTER — Encounter (HOSPITAL_COMMUNITY)
Admission: RE | Admit: 2023-12-03 | Discharge: 2023-12-03 | Disposition: A | Discharge status: Home / Self Care | Source: Ambulatory Visit | Attending: Neurological Surgery | Admitting: Neurological Surgery

## 2023-12-03 ENCOUNTER — Other Ambulatory Visit: Payer: Self-pay

## 2023-12-03 VITALS — BP 142/93 | HR 59 | Temp 98.2°F | Resp 18 | Ht 68.0 in | Wt 245.5 lb

## 2023-12-03 DIAGNOSIS — Z7984 Long term (current) use of oral hypoglycemic drugs: Secondary | ICD-10-CM | POA: Insufficient documentation

## 2023-12-03 DIAGNOSIS — Z9079 Acquired absence of other genital organ(s): Secondary | ICD-10-CM | POA: Insufficient documentation

## 2023-12-03 DIAGNOSIS — Z01818 Encounter for other preprocedural examination: Secondary | ICD-10-CM | POA: Insufficient documentation

## 2023-12-03 DIAGNOSIS — E785 Hyperlipidemia, unspecified: Secondary | ICD-10-CM | POA: Insufficient documentation

## 2023-12-03 DIAGNOSIS — R001 Bradycardia, unspecified: Secondary | ICD-10-CM | POA: Insufficient documentation

## 2023-12-03 DIAGNOSIS — Z981 Arthrodesis status: Secondary | ICD-10-CM | POA: Diagnosis not present

## 2023-12-03 DIAGNOSIS — G4730 Sleep apnea, unspecified: Secondary | ICD-10-CM | POA: Insufficient documentation

## 2023-12-03 DIAGNOSIS — Z8546 Personal history of malignant neoplasm of prostate: Secondary | ICD-10-CM | POA: Diagnosis not present

## 2023-12-03 DIAGNOSIS — Z6837 Body mass index (BMI) 37.0-37.9, adult: Secondary | ICD-10-CM | POA: Insufficient documentation

## 2023-12-03 DIAGNOSIS — I10 Essential (primary) hypertension: Secondary | ICD-10-CM | POA: Diagnosis not present

## 2023-12-03 DIAGNOSIS — E669 Obesity, unspecified: Secondary | ICD-10-CM | POA: Diagnosis not present

## 2023-12-03 DIAGNOSIS — M4722 Other spondylosis with radiculopathy, cervical region: Secondary | ICD-10-CM | POA: Insufficient documentation

## 2023-12-03 DIAGNOSIS — E119 Type 2 diabetes mellitus without complications: Secondary | ICD-10-CM | POA: Diagnosis not present

## 2023-12-03 LAB — CBC
HCT: 43.9 % (ref 39.0–52.0)
Hemoglobin: 14.1 g/dL (ref 13.0–17.0)
MCH: 27.3 pg (ref 26.0–34.0)
MCHC: 32.1 g/dL (ref 30.0–36.0)
MCV: 85.1 fL (ref 80.0–100.0)
Platelets: 295 10*3/uL (ref 150–400)
RBC: 5.16 MIL/uL (ref 4.22–5.81)
RDW: 14.1 % (ref 11.5–15.5)
WBC: 8.7 10*3/uL (ref 4.0–10.5)
nRBC: 0 % (ref 0.0–0.2)

## 2023-12-03 LAB — SURGICAL PCR SCREEN
MRSA, PCR: NEGATIVE
Staphylococcus aureus: NEGATIVE

## 2023-12-03 LAB — BASIC METABOLIC PANEL WITH GFR
Anion gap: 10 (ref 5–15)
BUN: 26 mg/dL — ABNORMAL HIGH (ref 8–23)
CO2: 29 mmol/L (ref 22–32)
Calcium: 9.4 mg/dL (ref 8.9–10.3)
Chloride: 100 mmol/L (ref 98–111)
Creatinine, Ser: 0.97 mg/dL (ref 0.61–1.24)
GFR, Estimated: >60 mL/min (ref >60)
Glucose, Bld: 210 mg/dL — ABNORMAL HIGH (ref 70–99)
Potassium: 3.7 mmol/L (ref 3.5–5.1)
Sodium: 139 mmol/L (ref 135–145)

## 2023-12-03 LAB — GLUCOSE, CAPILLARY: Glucose-Capillary: 209 mg/dL — ABNORMAL HIGH (ref 70–99)

## 2023-12-03 NOTE — Progress Notes (Signed)
 PCP - Roise Cleaver, MD Cardiologist - denies  PPM/ICD - denies Device Orders - n/a Rep Notified - n/a  Chest x-ray - denies EKG - 12/03/2023 Stress Test - denies ECHO - denies Cardiac Cath - denies  Sleep Study - denies; OSA in 2013 per chart CPAP - no  Fasting Blood Sugar - 160 per pt; 209 at PAT Checks Blood Sugar 3 times a week  Last dose of GLP1 agonist-  n/a GLP1 instructions: n/a  Blood Thinner Instructions: n/a Aspirin Instructions: n/a  ERAS Protcol - yes; till 1000 PRE-SURGERY Ensure or G2- no  COVID TEST- n/a   Anesthesia review: yes; hx of DM2 and HTN  Patient denies shortness of breath, fever, cough and chest pain at PAT appointment   All instructions explained to the patient, with a verbal understanding of the material. Patient agrees to go over the instructions while at home for a better understanding. Patient also instructed to self quarantine after being tested for COVID-19. The opportunity to ask questions was provided.

## 2023-12-04 NOTE — Anesthesia Preprocedure Evaluation (Addendum)
 Anesthesia Evaluation  Patient identified by MRN, date of birth, ID band Patient awake    Reviewed: Allergy & Precautions, NPO status , Patient's Chart, lab work & pertinent test results, reviewed documented beta blocker date and time   History of Anesthesia Complications Negative for: history of anesthetic complications  Airway Mallampati: II  TM Distance: >3 FB Neck ROM: Full    Dental no notable dental hx.    Pulmonary sleep apnea    Pulmonary exam normal        Cardiovascular hypertension, Pt. on medications and Pt. on home beta blockers Normal cardiovascular exam     Neuro/Psych cervical radiculopathy, s/p ACDF C5-7 04/13/19    GI/Hepatic Neg liver ROS,GERD  Medicated,,  Endo/Other  diabetes (A1c 7.7), Type 2, Oral Hypoglycemic Agents    Renal/GU negative Renal ROS  negative genitourinary   Musculoskeletal negative musculoskeletal ROS (+)    Abdominal   Peds  Hematology negative hematology ROS (+)   Anesthesia Other Findings Day of surgery medications reviewed with patient.  Reproductive/Obstetrics negative OB ROS                             Anesthesia Physical Anesthesia Plan  ASA: 2  Anesthesia Plan: General   Post-op Pain Management: Tylenol  PO (pre-op)*, Dilaudid  IV and Ketamine IV*   Induction: Intravenous  PONV Risk Score and Plan: 3 and Treatment may vary due to age or medical condition, Dexamethasone , Ondansetron  and Midazolam   Airway Management Planned: Oral ETT and Video Laryngoscope Planned  Additional Equipment: None  Intra-op Plan:   Post-operative Plan: Extubation in OR  Informed Consent: I have reviewed the patients History and Physical, chart, labs and discussed the procedure including the risks, benefits and alternatives for the proposed anesthesia with the patient or authorized representative who has indicated his/her understanding and acceptance.      Dental advisory given  Plan Discussed with: CRNA  Anesthesia Plan Comments: (PAT note written 12/04/2023 by Allison Zelenak, PA-C.  )       Anesthesia Quick Evaluation

## 2023-12-04 NOTE — Progress Notes (Signed)
 Anesthesia Chart Review:  Case: [id number] Date/Time: 12/14/23 1247   Procedure: ANTERIOR CERVICAL DECOMPRESSION/DISCECTOMY FUSION 2 LEVELS - ACDF - C3-C4 - C4-C5, possible removal of plate A5-4   Anesthesia type: General   Diagnosis: Radiculopathy, cervical region [M54.12]   Pre-op diagnosis: Radiculopathy, cervical region   [facility] ROOM 21 / [facility] OR   Surgeons: Joaquin Mulberry, MD       DISCUSSION: Patient is a 68 year old male scheduled for the above procedure.  History includes never smoker, HTN, DM2, hyperlipidemia, vertigo (admission 08/2018 for BPPV, MRI negative for CVA), prostate cancer (s/p robotic-assisted laparoscopic radical prostatectomy 10/22/11), spinal surgery (ACDF C5-7 04/13/19). Elevated OSA screening score. BMI is consistent with obesity. .    Recent routine follow-up with PCP Dr. Veleta Gerold on 10/28/23. He had pending MRI for neck pain at that time. A1c 7.7%, down from 8.2%. 3 month follow-up planned.  EKG showed sinus bradycardia at 58 bpm, non-specific intra-ventricular conduction delay which has been present on prior tracings. He denied shortness of breath, cough, fever, chest pain at PAT RN visit.   Anesthesia team to evaluate on the day of surgery.    VS: BP (!) 142/93   Pulse (!) 59   Temp 36.8 C   Resp 18   Ht 5' 8 (1.727 m)   Wt 111.4 kg   SpO2 99%   BMI 37.33 kg/m   PROVIDERS: Roise Cleaver, MD is PCP    LABS: Labs reviewed: Acceptable for surgery. A1c 7.7% 10/28/23.  (all labs ordered are listed, but only abnormal results are displayed)  Labs Reviewed  GLUCOSE, CAPILLARY - Abnormal; Notable for the following components:      Result Value   Glucose-Capillary 209 (*)    All other components within normal limits  BASIC METABOLIC PANEL WITH GFR - Abnormal; Notable for the following components:   Glucose, Bld 210 (*)    BUN 26 (*)    All other components within normal limits  SURGICAL PCR SCREEN  CBC     IMAGES: MRI C-spine 11/04/23  (Canopy/PACS): IMPRESSION: - Degenerative changes of the cervical spine as above. Disc osteophyte complex and thickening of the ligamentum flavum at C4-5 resulting in severe spinal canal stenosis with mild cord compression. No cervical cord signal abnormality appreciated. - Additional disc osteophyte complexes at multiple levels resulting in mild flattening of the ventral cord at C3-4, C6-7, and C7-T1. - Multilevel foraminal stenosis, greatest and severe on the right at C3-4 and bilaterally at C4-5. Moderate to severe foraminal stenosis on the right at C7-T1. - Anterior cervical fusion spanning C5-C7. - Modic type 1 degenerative endplate changes at C4-5 with associated edema which may contribute to neck pain.    EKG: 12/03/23: Sinus bradycardia at 58 bpm Non-specific intra-ventricular conduction delay Borderline ECG When compared with ECG of 11-Apr-2019 10:52, PREVIOUS ECG IS PRESENT Confirmed by Grady Lawman ([id number]) on 12/03/2023 10:27:57 PM - Comparison EKG 11/24/19 SR, RBBB; 04/11/19 NSR, non-specific intra-ventricular conduction block, minimal voltage criteria for LVH, nonspecific T wave abnormality   CV: N/A  Past Medical History:  Diagnosis Date   Bleeding hemorrhoid 10/27/2017   Cancer [facility])    prostate cancer   Cervical disc disorder with radiculopathy    right arm numbness   Cervical stenosis of spine    Diabetes mellitus without complication (HCC)    Hypertension    Incontinence    Lipidemia    Nerve pain due to spinal stenosis    Sleep apnea 10/14/2011  STOP BANG SCORE 4   Vertigo     Past Surgical History:  Procedure Laterality Date   ANTERIOR CERVICAL DECOMP/DISCECTOMY FUSION N/A 04/13/2019   Procedure: ANTERIOR CERVICAL DECOMPRESSION FUSION CERVICAL FIVE-SIX,CERVICAL SIX-SEVEN;  Surgeon: Isadora Mar, MD;  Location: [facility] OR;  Service: Neurosurgery;  Laterality: N/A;  anterior   bilateral jaw surgery     40+ yrs ago- for TMJ PROBLEMS AND WISDOM TEETH  IMPACTIONS   PROSTATE SURGERY     ROBOT ASSISTED LAPAROSCOPIC RADICAL PROSTATECTOMY  10/22/2011   Procedure: ROBOTIC ASSISTED LAPAROSCOPIC RADICAL PROSTATECTOMY;  Surgeon: Soledad Dupes, MD;  Location: [facility] ORS;  Service: Urology;  Laterality: N/A;      TONSILLECTOMY     UMBILICAL HERNIA REPAIR  2003   VASECTOMY      MEDICATIONS:  amLODipine  (NORVASC ) 5 MG tablet   atorvastatin  (LIPITOR) 40 MG tablet   fenofibrate  160 MG tablet   glimepiride  (AMARYL ) 2 MG tablet   lisinopril -hydrochlorothiazide  (ZESTORETIC ) 20-25 MG tablet   metFORMIN  (GLUCOPHAGE -XR) 750 MG 24 hr tablet   metoprolol  tartrate (LOPRESSOR ) 100 MG tablet   No current facility-administered medications for this encounter.    Ella Gun, PA-C Surgical Short Stay/Anesthesiology [facility] Phone [phone] [facility] Phone [phone] 12/04/2023 4:37 PM

## 2023-12-07 ENCOUNTER — Encounter: Payer: Self-pay | Admitting: Family Medicine

## 2023-12-07 ENCOUNTER — Ambulatory Visit: Admitting: Family Medicine

## 2023-12-07 VITALS — BP 133/74 | HR 63 | Temp 97.8°F | Ht 68.0 in | Wt 245.8 lb

## 2023-12-07 DIAGNOSIS — H1031 Unspecified acute conjunctivitis, right eye: Secondary | ICD-10-CM

## 2023-12-07 DIAGNOSIS — L989 Disorder of the skin and subcutaneous tissue, unspecified: Secondary | ICD-10-CM | POA: Diagnosis not present

## 2023-12-07 MED ORDER — OFLOXACIN 0.3 % OP SOLN: 2.0000 [drp] | Freq: Four times a day (QID) | OPHTHALMIC | 0 refills | Status: DC

## 2023-12-07 NOTE — Progress Notes (Signed)
 Acute Office Visit  Subjective:     Patient ID: Adam Sutton, male    DOB: Oct 26, 1955, 68 y.o.   MRN: [id number]  Chief Complaint  Patient presents with   Conjunctivitis    Conjunctivitis  The current episode started 2 days ago. The onset was gradual. Associated symptoms include eye discharge (increased watery, yellow/green) and eye redness (mild). Pertinent negatives include no fever, no decreased vision, no double vision, no eye itching, no congestion, no ear discharge, no ear pain, no headaches, no rhinorrhea, no sore throat, no stridor, no swollen glands and no eye pain. The right eye is affected. The eyelid exhibits no abnormality.   Adam Sutton does normally wear contacts. Has glasses on today.   Also reports a skin tag of his left upper thigh. This has gotten larger over the last few months. Flesh colored. In an area of high friction and is intermittently irritated.   Review of Systems  Constitutional:  Negative for fever.  HENT:  Negative for congestion, ear discharge, ear pain, rhinorrhea and sore throat.   Eyes:  Positive for discharge (increased watery, yellow/green) and redness (mild). Negative for double vision, pain and itching.  Respiratory:  Negative for stridor.   Neurological:  Negative for headaches.        Objective:    BP 133/74   Pulse 63   Temp 97.8 F (36.6 C) (Temporal)   Ht 5' 8 (1.727 m)   Wt 245 lb 12.8 oz (111.5 kg)   SpO2 97%   BMI 37.37 kg/m    Physical Exam Vitals and nursing note reviewed.  Constitutional:      General: Adam Sutton is not in acute distress.    Appearance: Adam Sutton is not ill-appearing, toxic-appearing or diaphoretic.   Eyes:     General: Lids are normal. Lids are everted, no foreign bodies appreciated. Vision grossly intact.        Right eye: Discharge present. No foreign body or hordeolum.        Left eye: No foreign body, discharge or hordeolum.     Extraocular Movements: Extraocular movements intact.     Conjunctiva/sclera:      Right eye: Right conjunctiva is injected. No exudate.  Pulmonary:     Effort: Pulmonary effort is normal. No respiratory distress.   Musculoskeletal:     Right lower leg: No edema.     Left lower leg: No edema.   Skin:    General: Skin is warm and dry.     Findings: Lesion (flesh colored lesion to right upper medial thigh. See picture below.) present.   Neurological:     General: No focal deficit present.     Mental Status: Adam Sutton is alert and oriented to person, place, and time.   Psychiatric:        Mood and Affect: Mood normal.        Behavior: Behavior normal.       No results found for any visits on 12/07/23.      Assessment & Plan:   Adam Sutton was seen today for conjunctivitis.  Diagnoses and all orders for this visit:  Acute bacterial conjunctivitis of right eye Ofloxacin as below. Return to office for new or worsening symptoms, or if symptoms persist.  -     ofloxacin (OCUFLOX) 0.3 % ophthalmic solution; Place 2 drops into the right eye 4 (four) times daily.  Skin lesion of right leg Referral to derm discussed and placed.   The patient indicates understanding of these issues  and agrees with the plan.  Annabella CHRISTELLA Search, FNP

## 2023-12-11 ENCOUNTER — Ambulatory Visit (HOSPITAL_COMMUNITY)
Admission: RE | Admit: 2023-12-11 | Discharge: 2023-12-11 | Disposition: A | Discharge status: Home / Self Care | Source: Ambulatory Visit | Attending: Family Medicine | Admitting: Family Medicine

## 2023-12-11 DIAGNOSIS — G8929 Other chronic pain: Secondary | ICD-10-CM | POA: Diagnosis present

## 2023-12-11 DIAGNOSIS — R109 Unspecified abdominal pain: Secondary | ICD-10-CM | POA: Insufficient documentation

## 2023-12-11 MED ORDER — IOHEXOL 9 MG/ML PO SOLN
ORAL | Status: AC
  Filled 2023-12-11: qty 1000

## 2023-12-11 MED ORDER — IOHEXOL 9 MG/ML PO SOLN
500.0000 mL | ORAL | Status: AC
  Administered 2023-12-11: 1000 mL via ORAL

## 2023-12-11 MED ORDER — IOHEXOL 300 MG/ML  SOLN
100.0000 mL | Freq: Once | INTRAMUSCULAR | Status: AC | PRN
  Administered 2023-12-11: 100 mL via INTRAVENOUS

## 2023-12-14 ENCOUNTER — Other Ambulatory Visit: Payer: Self-pay

## 2023-12-14 ENCOUNTER — Ambulatory Visit (HOSPITAL_COMMUNITY): Admitting: Anesthesiology

## 2023-12-14 ENCOUNTER — Observation Stay (HOSPITAL_COMMUNITY)
Admission: RE | Admit: 2023-12-14 | Discharge: 2023-12-15 | Disposition: A | Discharge status: Home / Self Care | Attending: Neurological Surgery | Admitting: Neurological Surgery

## 2023-12-14 ENCOUNTER — Encounter (HOSPITAL_COMMUNITY): Payer: Self-pay | Admitting: Neurological Surgery

## 2023-12-14 ENCOUNTER — Encounter (HOSPITAL_COMMUNITY)
Admission: RE | Disposition: A | Discharge status: Home / Self Care | Payer: Self-pay | Source: Home / Self Care | Attending: Neurological Surgery

## 2023-12-14 ENCOUNTER — Ambulatory Visit (HOSPITAL_COMMUNITY)

## 2023-12-14 ENCOUNTER — Ambulatory Visit (HOSPITAL_COMMUNITY): Payer: Self-pay | Admitting: Vascular Surgery

## 2023-12-14 DIAGNOSIS — Z981 Arthrodesis status: Principal | ICD-10-CM

## 2023-12-14 DIAGNOSIS — Z79899 Other long term (current) drug therapy: Secondary | ICD-10-CM | POA: Diagnosis not present

## 2023-12-14 DIAGNOSIS — I10 Essential (primary) hypertension: Secondary | ICD-10-CM | POA: Diagnosis not present

## 2023-12-14 DIAGNOSIS — E119 Type 2 diabetes mellitus without complications: Secondary | ICD-10-CM | POA: Insufficient documentation

## 2023-12-14 DIAGNOSIS — Z8546 Personal history of malignant neoplasm of prostate: Secondary | ICD-10-CM | POA: Insufficient documentation

## 2023-12-14 DIAGNOSIS — M4802 Spinal stenosis, cervical region: Secondary | ICD-10-CM

## 2023-12-14 DIAGNOSIS — M4722 Other spondylosis with radiculopathy, cervical region: Secondary | ICD-10-CM | POA: Diagnosis present

## 2023-12-14 DIAGNOSIS — M50121 Cervical disc disorder at C4-C5 level with radiculopathy: Secondary | ICD-10-CM | POA: Insufficient documentation

## 2023-12-14 DIAGNOSIS — Z7984 Long term (current) use of oral hypoglycemic drugs: Secondary | ICD-10-CM | POA: Diagnosis not present

## 2023-12-14 DIAGNOSIS — M5011 Cervical disc disorder with radiculopathy,  high cervical region: Secondary | ICD-10-CM | POA: Diagnosis not present

## 2023-12-14 HISTORY — PX: ANTERIOR CERVICAL DECOMP/DISCECTOMY FUSION: SHX1161

## 2023-12-14 LAB — GLUCOSE, CAPILLARY
Glucose-Capillary: 205 mg/dL — ABNORMAL HIGH (ref 70–99)
Glucose-Capillary: 178 mg/dL — ABNORMAL HIGH (ref 70–99)
Glucose-Capillary: 175 mg/dL — ABNORMAL HIGH (ref 70–99)
Glucose-Capillary: 247 mg/dL — ABNORMAL HIGH (ref 70–99)

## 2023-12-14 LAB — HEMOGLOBIN A1C
Hgb A1c MFr Bld: 10.1 % — ABNORMAL HIGH (ref 4.8–5.6)
Mean Plasma Glucose: 243.17 mg/dL

## 2023-12-14 SURGERY — ANTERIOR CERVICAL DECOMPRESSION/DISCECTOMY FUSION 2 LEVELS
Anesthesia: General

## 2023-12-14 MED ORDER — SODIUM CHLORIDE 0.9 % IV SOLN
250.0000 mL | INTRAVENOUS | Status: DC
  Administered 2023-12-14: 250 mL via INTRAVENOUS

## 2023-12-14 MED ORDER — LISINOPRIL-HYDROCHLOROTHIAZIDE 20-25 MG PO TABS: 1.0000 | ORAL_TABLET | Freq: Every day | ORAL | Status: DC

## 2023-12-14 MED ORDER — CEFAZOLIN SODIUM-DEXTROSE 2-4 GM/100ML-% IV SOLN
2.0000 g | Freq: Three times a day (TID) | INTRAVENOUS | Status: AC
  Administered 2023-12-14 – 2023-12-15 (×2): 2 g via INTRAVENOUS
  Filled 2023-12-14 (×2): qty 100

## 2023-12-14 MED ORDER — HYDROMORPHONE HCL 1 MG/ML IJ SOLN: 0.2500 mg | INTRAMUSCULAR | Status: DC | PRN

## 2023-12-14 MED ORDER — LIDOCAINE 2% (20 MG/ML) 5 ML SYRINGE
INTRAMUSCULAR | Status: AC
Start: 2023-12-14 — End: 2023-12-14
  Filled 2023-12-14: qty 5

## 2023-12-14 MED ORDER — THROMBIN (RECOMBINANT) 5000 UNITS EX SOLR
CUTANEOUS | Status: DC | PRN
  Administered 2023-12-14: 10 mL via TOPICAL

## 2023-12-14 MED ORDER — ACETAMINOPHEN 10 MG/ML IV SOLN: 1000.0000 mg | Freq: Once | INTRAVENOUS | Status: DC | PRN

## 2023-12-14 MED ORDER — HYDROCHLOROTHIAZIDE 25 MG PO TABS
25.0000 mg | ORAL_TABLET | Freq: Every day | ORAL | Status: DC
  Administered 2023-12-14: 25 mg via ORAL
  Filled 2023-12-14: qty 1

## 2023-12-14 MED ORDER — PHENOL 1.4 % MT LIQD
1.0000 | OROMUCOSAL | Status: DC | PRN
  Filled 2023-12-14: qty 177

## 2023-12-14 MED ORDER — OXYCODONE HCL 5 MG PO TABS
5.0000 mg | ORAL_TABLET | ORAL | Status: DC | PRN
  Administered 2023-12-14: 5 mg via ORAL

## 2023-12-14 MED ORDER — METOPROLOL TARTRATE 100 MG PO TABS
100.0000 mg | ORAL_TABLET | Freq: Two times a day (BID) | ORAL | Status: DC
  Administered 2023-12-14: 100 mg via ORAL
  Filled 2023-12-14 (×2): qty 1
  Filled 2023-12-14: qty 2

## 2023-12-14 MED ORDER — BUPIVACAINE HCL (PF) 0.25 % IJ SOLN
INTRAMUSCULAR | Status: AC
  Filled 2023-12-14: qty 30

## 2023-12-14 MED ORDER — CHLORHEXIDINE GLUCONATE CLOTH 2 % EX PADS: 6.0000 | MEDICATED_PAD | Freq: Once | CUTANEOUS | Status: DC

## 2023-12-14 MED ORDER — SODIUM CHLORIDE 0.9% FLUSH: 3.0000 mL | INTRAVENOUS | Status: DC | PRN

## 2023-12-14 MED ORDER — LACTATED RINGERS IV SOLN: INTRAVENOUS | Status: DC

## 2023-12-14 MED ORDER — GLIMEPIRIDE 2 MG PO TABS
2.0000 mg | ORAL_TABLET | Freq: Every day | ORAL | Status: DC
  Administered 2023-12-15: 2 mg via ORAL
  Filled 2023-12-14: qty 1

## 2023-12-14 MED ORDER — FENTANYL CITRATE (PF) 100 MCG/2ML IJ SOLN
INTRAMUSCULAR | Status: AC
  Filled 2023-12-14: qty 4

## 2023-12-14 MED ORDER — ACETAMINOPHEN 500 MG PO TABS: 1000.0000 mg | ORAL_TABLET | ORAL | Status: DC

## 2023-12-14 MED ORDER — CEFAZOLIN SODIUM-DEXTROSE 2-4 GM/100ML-% IV SOLN
2.0000 g | INTRAVENOUS | Status: AC
  Administered 2023-12-14: 2 g via INTRAVENOUS
  Filled 2023-12-14: qty 100

## 2023-12-14 MED ORDER — PROPOFOL 10 MG/ML IV BOLUS
INTRAVENOUS | Status: DC | PRN
  Administered 2023-12-14: 200 mg via INTRAVENOUS

## 2023-12-14 MED ORDER — DEXAMETHASONE 4 MG PO TABS
4.0000 mg | ORAL_TABLET | Freq: Four times a day (QID) | ORAL | Status: DC
  Administered 2023-12-14 – 2023-12-15 (×3): 4 mg via ORAL
  Filled 2023-12-14 (×3): qty 1

## 2023-12-14 MED ORDER — ORAL CARE MOUTH RINSE: 15.0000 mL | Freq: Once | OROMUCOSAL | Status: AC

## 2023-12-14 MED ORDER — ONDANSETRON HCL 4 MG/2ML IJ SOLN: 4.0000 mg | Freq: Four times a day (QID) | INTRAMUSCULAR | Status: DC | PRN

## 2023-12-14 MED ORDER — MENTHOL 3 MG MT LOZG: 1.0000 | LOZENGE | OROMUCOSAL | Status: DC | PRN

## 2023-12-14 MED ORDER — INSULIN ASPART 100 UNIT/ML IJ SOLN
0.0000 [IU] | Freq: Every day | INTRAMUSCULAR | Status: DC
  Administered 2023-12-14: 2 [IU] via SUBCUTANEOUS

## 2023-12-14 MED ORDER — METHOCARBAMOL 500 MG PO TABS
500.0000 mg | ORAL_TABLET | Freq: Four times a day (QID) | ORAL | Status: DC | PRN
  Administered 2023-12-14 – 2023-12-15 (×3): 500 mg via ORAL
  Filled 2023-12-14 (×2): qty 1

## 2023-12-14 MED ORDER — ACETAMINOPHEN 650 MG RE SUPP: 650.0000 mg | RECTAL | Status: DC | PRN

## 2023-12-14 MED ORDER — ACETAMINOPHEN 500 MG PO TABS
1000.0000 mg | ORAL_TABLET | Freq: Four times a day (QID) | ORAL | Status: DC
  Administered 2023-12-14 – 2023-12-15 (×3): 1000 mg via ORAL
  Filled 2023-12-14 (×3): qty 2

## 2023-12-14 MED ORDER — PROPOFOL 10 MG/ML IV BOLUS
INTRAVENOUS | Status: AC
  Filled 2023-12-14: qty 20

## 2023-12-14 MED ORDER — MIDAZOLAM HCL 2 MG/2ML IJ SOLN
INTRAMUSCULAR | Status: AC
  Filled 2023-12-14: qty 2

## 2023-12-14 MED ORDER — ROCURONIUM BROMIDE 10 MG/ML (PF) SYRINGE
PREFILLED_SYRINGE | INTRAVENOUS | Status: DC | PRN
  Administered 2023-12-14: 30 mg via INTRAVENOUS
  Administered 2023-12-14: 90 mg via INTRAVENOUS
  Administered 2023-12-14: 20 mg via INTRAVENOUS

## 2023-12-14 MED ORDER — CHLORHEXIDINE GLUCONATE 0.12 % MT SOLN
15.0000 mL | Freq: Once | OROMUCOSAL | Status: AC
Start: 2023-12-14 — End: 2023-12-14
  Administered 2023-12-14: 15 mL via OROMUCOSAL
  Filled 2023-12-14: qty 15

## 2023-12-14 MED ORDER — DEXAMETHASONE SODIUM PHOSPHATE 10 MG/ML IJ SOLN
INTRAMUSCULAR | Status: DC | PRN
  Administered 2023-12-14: 4 mg via INTRAVENOUS

## 2023-12-14 MED ORDER — GABAPENTIN 300 MG PO CAPS
300.0000 mg | ORAL_CAPSULE | ORAL | Status: AC
  Administered 2023-12-14: 300 mg via ORAL
  Filled 2023-12-14: qty 1

## 2023-12-14 MED ORDER — ACETAMINOPHEN 325 MG PO TABS: 650.0000 mg | ORAL_TABLET | ORAL | Status: DC | PRN

## 2023-12-14 MED ORDER — LISINOPRIL 20 MG PO TABS
20.0000 mg | ORAL_TABLET | Freq: Every day | ORAL | Status: DC
  Administered 2023-12-14: 20 mg via ORAL
  Filled 2023-12-14: qty 1

## 2023-12-14 MED ORDER — OXYCODONE HCL 5 MG PO TABS
10.0000 mg | ORAL_TABLET | ORAL | Status: DC | PRN
  Administered 2023-12-14 – 2023-12-15 (×4): 10 mg via ORAL
  Filled 2023-12-14 (×4): qty 2

## 2023-12-14 MED ORDER — MIDAZOLAM HCL 2 MG/2ML IJ SOLN
INTRAMUSCULAR | Status: DC | PRN
  Administered 2023-12-14: 2 mg via INTRAVENOUS

## 2023-12-14 MED ORDER — INSULIN ASPART 100 UNIT/ML IJ SOLN
0.0000 [IU] | Freq: Three times a day (TID) | INTRAMUSCULAR | Status: DC
  Administered 2023-12-15: 3 [IU] via SUBCUTANEOUS

## 2023-12-14 MED ORDER — ACETAMINOPHEN 325 MG PO TABS
650.0000 mg | ORAL_TABLET | ORAL | Status: DC | PRN
Start: 2023-12-15 — End: 2023-12-15

## 2023-12-14 MED ORDER — SODIUM CHLORIDE 0.9 % IV SOLN: INTRAVENOUS | Status: DC | PRN

## 2023-12-14 MED ORDER — 0.9 % SODIUM CHLORIDE (POUR BTL) OPTIME
TOPICAL | Status: DC | PRN
  Administered 2023-12-14: 1000 mL

## 2023-12-14 MED ORDER — CHLORHEXIDINE GLUCONATE CLOTH 2 % EX PADS
6.0000 | MEDICATED_PAD | Freq: Once | CUTANEOUS | Status: AC
  Administered 2023-12-14: 6 via TOPICAL

## 2023-12-14 MED ORDER — MORPHINE SULFATE (PF) 2 MG/ML IV SOLN: 2.0000 mg | INTRAVENOUS | Status: DC | PRN

## 2023-12-14 MED ORDER — ACETAMINOPHEN 500 MG PO TABS
1000.0000 mg | ORAL_TABLET | Freq: Once | ORAL | Status: AC
  Administered 2023-12-14: 1000 mg via ORAL
  Filled 2023-12-14: qty 2

## 2023-12-14 MED ORDER — LIDOCAINE 2% (20 MG/ML) 5 ML SYRINGE
INTRAMUSCULAR | Status: DC | PRN
  Administered 2023-12-14: 100 mg via INTRAVENOUS

## 2023-12-14 MED ORDER — SUGAMMADEX SODIUM 200 MG/2ML IV SOLN
INTRAVENOUS | Status: DC | PRN
  Administered 2023-12-14: 200 mg via INTRAVENOUS

## 2023-12-14 MED ORDER — BUPIVACAINE HCL (PF) 0.25 % IJ SOLN
INTRAMUSCULAR | Status: DC | PRN
  Administered 2023-12-14: 5 mL

## 2023-12-14 MED ORDER — SODIUM CHLORIDE 0.9% FLUSH
3.0000 mL | Freq: Two times a day (BID) | INTRAVENOUS | Status: DC
  Administered 2023-12-14: 3 mL via INTRAVENOUS

## 2023-12-14 MED ORDER — AMLODIPINE BESYLATE 2.5 MG PO TABS
2.5000 mg | ORAL_TABLET | Freq: Every day | ORAL | Status: DC
  Filled 2023-12-14: qty 1

## 2023-12-14 MED ORDER — EPHEDRINE SULFATE-NACL 50-0.9 MG/10ML-% IV SOSY
PREFILLED_SYRINGE | INTRAVENOUS | Status: DC | PRN
  Administered 2023-12-14: 5 mg via INTRAVENOUS
  Administered 2023-12-14 (×2): 10 mg via INTRAVENOUS

## 2023-12-14 MED ORDER — INSULIN ASPART 100 UNIT/ML IJ SOLN: 0.0000 [IU] | Freq: Three times a day (TID) | INTRAMUSCULAR | Status: DC

## 2023-12-14 MED ORDER — DEXAMETHASONE SODIUM PHOSPHATE 4 MG/ML IJ SOLN: 4.0000 mg | Freq: Four times a day (QID) | INTRAMUSCULAR | Status: DC

## 2023-12-14 MED ORDER — METFORMIN HCL ER 750 MG PO TB24
750.0000 mg | ORAL_TABLET | Freq: Two times a day (BID) | ORAL | Status: DC
  Administered 2023-12-15: 750 mg via ORAL
  Filled 2023-12-14 (×2): qty 1

## 2023-12-14 MED ORDER — INSULIN ASPART 100 UNIT/ML IJ SOLN
0.0000 [IU] | INTRAMUSCULAR | Status: AC | PRN
  Administered 2023-12-14: 2 [IU] via SUBCUTANEOUS
  Administered 2023-12-14: 4 [IU] via SUBCUTANEOUS

## 2023-12-14 MED ORDER — FENTANYL CITRATE (PF) 250 MCG/5ML IJ SOLN
INTRAMUSCULAR | Status: DC | PRN
  Administered 2023-12-14: 100 ug via INTRAVENOUS

## 2023-12-14 MED ORDER — METHOCARBAMOL 500 MG PO TABS
ORAL_TABLET | ORAL | Status: AC
  Filled 2023-12-14: qty 1

## 2023-12-14 MED ORDER — OXYCODONE HCL 5 MG PO TABS
ORAL_TABLET | ORAL | Status: AC
Start: 2023-12-14 — End: 2023-12-14
  Filled 2023-12-14: qty 1

## 2023-12-14 MED ORDER — SENNA 8.6 MG PO TABS
1.0000 | ORAL_TABLET | Freq: Two times a day (BID) | ORAL | Status: DC
  Administered 2023-12-14: 8.6 mg via ORAL
  Filled 2023-12-14: qty 1

## 2023-12-14 MED ORDER — KETAMINE HCL 50 MG/5ML IJ SOSY
PREFILLED_SYRINGE | INTRAMUSCULAR | Status: DC | PRN
Start: 2023-12-14 — End: 2023-12-14
  Administered 2023-12-14: 30 mg via INTRAVENOUS
  Administered 2023-12-14: 20 mg via INTRAVENOUS

## 2023-12-14 MED ORDER — DROPERIDOL 2.5 MG/ML IJ SOLN: 0.6250 mg | Freq: Once | INTRAMUSCULAR | Status: DC | PRN

## 2023-12-14 MED ORDER — SODIUM CHLORIDE 0.9% FLUSH: 3.0000 mL | Freq: Two times a day (BID) | INTRAVENOUS | Status: DC

## 2023-12-14 MED ORDER — ONDANSETRON HCL 4 MG/2ML IJ SOLN
INTRAMUSCULAR | Status: DC | PRN
  Administered 2023-12-14: 4 mg via INTRAVENOUS

## 2023-12-14 MED ORDER — ONDANSETRON HCL 4 MG PO TABS: 4.0000 mg | ORAL_TABLET | Freq: Four times a day (QID) | ORAL | Status: DC | PRN

## 2023-12-14 MED ORDER — KETAMINE HCL 50 MG/5ML IJ SOSY
PREFILLED_SYRINGE | INTRAMUSCULAR | Status: AC
  Filled 2023-12-14: qty 5

## 2023-12-14 MED ORDER — THROMBIN 5000 UNITS EX KIT
PACK | CUTANEOUS | Status: AC
  Filled 2023-12-14: qty 3

## 2023-12-14 MED ORDER — METHOCARBAMOL 1000 MG/10ML IJ SOLN
500.0000 mg | Freq: Four times a day (QID) | INTRAMUSCULAR | Status: DC | PRN
Start: 2023-12-14 — End: 2023-12-15

## 2023-12-14 MED ORDER — OFLOXACIN 0.3 % OP SOLN
2.0000 [drp] | Freq: Four times a day (QID) | OPHTHALMIC | Status: DC
  Administered 2023-12-14: 2 [drp] via OPHTHALMIC
  Filled 2023-12-14: qty 5

## 2023-12-14 MED ORDER — FENOFIBRATE 160 MG PO TABS: 160.0000 mg | ORAL_TABLET | Freq: Every day | ORAL | Status: DC

## 2023-12-14 MED ORDER — ROCURONIUM BROMIDE 10 MG/ML (PF) SYRINGE
PREFILLED_SYRINGE | INTRAVENOUS | Status: AC
Start: 2023-12-14 — End: 2023-12-14
  Filled 2023-12-14: qty 10

## 2023-12-14 MED ORDER — THROMBIN 5000 UNITS EX SOLR
OROMUCOSAL | Status: DC | PRN
  Administered 2023-12-14: 5 mL via TOPICAL

## 2023-12-14 SURGICAL SUPPLY — 47 items
BAG COUNTER SPONGE SURGICOUNT (BAG) ×2 IMPLANT
BAND RUBBER #18 3X1/16 STRL (MISCELLANEOUS) ×4 IMPLANT
BASKET BONE COLLECTION (BASKET) ×2 IMPLANT
BENZOIN TINCTURE PRP APPL 2/3 (GAUZE/BANDAGES/DRESSINGS) ×2 IMPLANT
BONE MATRIX GEL 5CC (Bone Implant) IMPLANT
BUR CARBIDE MATCH 3.0 (BURR) ×2 IMPLANT
CANISTER SUCTION 3000ML PPV (SUCTIONS) ×2 IMPLANT
DRAPE C-ARM 42X72 X-RAY (DRAPES) ×4 IMPLANT
DRAPE LAPAROTOMY 100X72 PEDS (DRAPES) ×2 IMPLANT
DRAPE MICROSCOPE SLANT 54X150 (MISCELLANEOUS) IMPLANT
DRSG OPSITE POSTOP 4X6 (GAUZE/BANDAGES/DRESSINGS) IMPLANT
DURAPREP 6ML APPLICATOR 50/CS (WOUND CARE) ×2 IMPLANT
ELECT COATED BLADE 2.86 ST (ELECTRODE) ×2 IMPLANT
ELECTRODE REM PT RTRN 9FT ADLT (ELECTROSURGICAL) ×2 IMPLANT
GAUZE 4X4 16PLY ~~LOC~~+RFID DBL (SPONGE) IMPLANT
GLOVE BIO SURGEON STRL SZ7 (GLOVE) ×2 IMPLANT
GLOVE BIO SURGEON STRL SZ8 (GLOVE) ×2 IMPLANT
GLOVE BIOGEL PI IND STRL 7.0 (GLOVE) ×2 IMPLANT
GOWN STRL REUS W/ TWL LRG LVL3 (GOWN DISPOSABLE) ×2 IMPLANT
GOWN STRL REUS W/ TWL XL LVL3 (GOWN DISPOSABLE) ×2 IMPLANT
GOWN STRL REUS W/TWL 2XL LVL3 (GOWN DISPOSABLE) IMPLANT
HEMOSTAT POWDER KIT SURGIFOAM (HEMOSTASIS) ×2 IMPLANT
KIT BASIN OR (CUSTOM PROCEDURE TRAY) ×2 IMPLANT
KIT TURNOVER KIT B (KITS) ×2 IMPLANT
NDL HYPO 25X1 1.5 SAFETY (NEEDLE) ×2 IMPLANT
NDL SPNL 20GX3.5 QUINCKE YW (NEEDLE) ×2 IMPLANT
NEEDLE HYPO 25X1 1.5 SAFETY (NEEDLE) ×1 IMPLANT
NEEDLE SPNL 20GX3.5 QUINCKE YW (NEEDLE) ×1 IMPLANT
NS IRRIG 1000ML POUR BTL (IV SOLUTION) ×2 IMPLANT
PACK LAMINECTOMY NEURO (CUSTOM PROCEDURE TRAY) ×2 IMPLANT
PAD ARMBOARD POSITIONER FOAM (MISCELLANEOUS) ×6 IMPLANT
PATTIES SURGICAL .5 X.5 (GAUZE/BANDAGES/DRESSINGS) ×2 IMPLANT
PATTIES SURGICAL 1X1 (DISPOSABLE) ×2 IMPLANT
PIN DISTRACTION 14MM (PIN) ×4 IMPLANT
PLATE ANTC INSIG 40 2L (Plate) IMPLANT
SCREW VA SINGLE LEAD 4X16 (Screw) IMPLANT
SPACER IDENTITI 7X16X14 7D (Spacer) IMPLANT
SPACER IDENTITI 8X16X14 7D (Spacer) IMPLANT
SPONGE INTESTINAL PEANUT (DISPOSABLE) ×2 IMPLANT
SPONGE SURGIFOAM ABS GEL SZ50 (HEMOSTASIS) IMPLANT
STRIP CLOSURE SKIN 1/2X4 (GAUZE/BANDAGES/DRESSINGS) ×2 IMPLANT
SUT VIC AB 3-0 SH 8-18 (SUTURE) ×4 IMPLANT
SUT VIC AB 4-0 PS2 18 (SUTURE) IMPLANT
SYR 30ML SLIP (SYRINGE) ×2 IMPLANT
TOWEL GREEN STERILE (TOWEL DISPOSABLE) ×2 IMPLANT
TOWEL GREEN STERILE FF (TOWEL DISPOSABLE) ×2 IMPLANT
WATER STERILE IRR 1000ML POUR (IV SOLUTION) ×2 IMPLANT

## 2023-12-14 NOTE — Anesthesia Postprocedure Evaluation (Signed)
 Anesthesia Post Note  Patient: Adam Sutton  Procedure(s) Performed: ANTERIOR CERVICAL DECOMPRESSION/DISCECTOMY FUSION CERVICAL  THREE-FOUR , CERVICAL FOUR-FIVE     Patient location during evaluation: PACU Anesthesia Type: General Level of consciousness: awake and alert Pain management: pain level controlled Vital Signs Assessment: post-procedure vital signs reviewed and stable Respiratory status: spontaneous breathing, nonlabored ventilation and respiratory function stable Cardiovascular status: blood pressure returned to baseline Postop Assessment: no apparent nausea or vomiting Anesthetic complications: no   No notable events documented.  Last Vitals:  Vitals:   12/14/23 1645 12/14/23 1700  BP: 138/82 133/84  Pulse: 67 69  Resp: 16 17  Temp:    SpO2: 96% 91%    Last Pain:  Vitals:   12/14/23 1625  TempSrc:   PainSc: 3                  Vertell Row

## 2023-12-14 NOTE — Plan of Care (Signed)

## 2023-12-14 NOTE — Transfer of Care (Signed)
 Immediate Anesthesia Transfer of Care Note  Patient: Adam Sutton  Procedure(s) Performed: ANTERIOR CERVICAL DECOMPRESSION/DISCECTOMY FUSION CERVICAL  THREE-FOUR , CERVICAL FOUR-FIVE  Patient Location: PACU  Anesthesia Type:General  Level of Consciousness: awake, alert , and oriented  Airway & Oxygen  Therapy: Patient Spontanous Breathing  Post-op Assessment: Report given to RN and Post -op Vital signs reviewed and stable  Post vital signs: Reviewed and stable  Last Vitals:  Vitals Value Taken Time  BP 141/91 12/14/23 15:31  Temp    Pulse 68 12/14/23 15:32  Resp 15 12/14/23 15:32  SpO2 90 % 12/14/23 15:32  Vitals shown include unfiled device data.  Last Pain:  Vitals:   12/14/23 1212  TempSrc:   PainSc: 0-No pain         Complications: No notable events documented.

## 2023-12-14 NOTE — Op Note (Signed)
 12/14/2023  3:15 PM  PATIENT:  Adam Sutton  68 y.o. male  PRE-OPERATIVE DIAGNOSIS: Cervical spondylosis with cervical spinal stenosis C3-4 C4-5 with neck pain and radiculopathy  POST-OPERATIVE DIAGNOSIS:  same  PROCEDURE:  1. Decompressive anterior cervical discectomy C3-4 C4-5, 2. Anterior cervical arthrodesis C3-4 C4-5 utilizing a PTI interbody cage packed with locally harvested morcellized autologous bone graft and DBM putty 3. Anterior cervical plating C3-C5 inclusive utilizing a ATEC plate  SURGEON:  Alm Molt, MD  ASSISTANTS: Suzen Pean, FNP  ANESTHESIA:   General  EBL: 30 ml  Total I/O In: 1151.7 [I.V.:1051.7; IV Piggyback:100] Out: 30 [Blood:30]  BLOOD ADMINISTERED: none  DRAINS: none  SPECIMEN:  none  INDICATION FOR PROCEDURE: This patient presented with neck pain and arm pain. Imaging showed spondylosis with cervical spinal stenosis C3-4 C4-5 above previous C5-C7 fusion. The patient tried conservative measures without relief. Pain was debilitating. Recommended ACDF with plating. Patient understood the risks, benefits, and alternatives and potential outcomes and wished to proceed.  PROCEDURE DETAILS: Patient was brought to the operating room placed under general endotracheal anesthesia. Patient was placed in the supine position on the operating room table. The neck was prepped with Duraprep and draped in a sterile fashion.   Three cc of local anesthesia was injected and a transverse incision was made on the right side of the neck.  Dissection was carried down thru the subcutaneous tissue and the platysma was  elevated, opened, and undermined with Metzenbaum scissors.  Dissection was then carried out thru an avascular plane leaving the sternocleidomastoid carotid artery and jugular vein laterally and the trachea and esophagus medially with the assistance of my nurse practitioner. The ventral aspect of the vertebral column was identified and a localizing x-ray was  taken. The C3-4 and C4-5 level was identified and all in the room agreed with the level. The longus colli muscles were then elevated and the retractor was placed with the assistance of my nurse practitioner. The annulus was incised and the disc space entered. Discectomy was performed with micro-curettes and pituitary rongeurs. I then used the high-speed drill to drill the endplates down to the level of the posterior longitudinal ligament. The drill shavings were saved in a mucous trap for later arthrodesis. The operating microscope was draped and brought into the field provided additional magnification, illumination and visualization. Discectomy was continued posteriorly thru the disc space. Posterior longitudinal ligament was opened with a nerve hook, and then removed along with disc herniation and osteophytes, decompressing the spinal canal and thecal sac. We then continued to remove osteophytic overgrowth and disc material decompressing the neural foramina and exiting nerve roots bilaterally. The scope was angled up and down to help decompress and undercut the vertebral bodies. Once the decompression was completed we could pass a nerve hook circumferentially to assure adequate decompression in the midline and in the neural foramina. So by both visualization and palpation we felt we had an adequate decompression of the neural elements. We then measured the height of the intravertebral disc space and selected a 7 millimeter PTI interbody cage packed with autograft and DBM for C3-4 and an 8 mm graft at C4-5. It was then gently positioned in the intravertebral disc space(s) and countersunk. I then used a 40 mm ATEC plate and placed 16 mm variable angle screws into the vertebral bodies of each level and locked them into position. The wound was irrigated with bacitracin  solution, checked for hemostasis which was established and confirmed. Once meticulous hemostasis was achieved,  we then proceeded with closure with the  assistance of my nurse practitioner. The platysma was closed with interrupted 3-0 undyed Vicryl suture, the subcuticular layer was closed with interrupted 3-0 undyed Vicryl suture. The skin edges were approximated with steristrips. The drapes were removed. A sterile dressing was applied. The patient was then awakened from general anesthesia and transferred to the recovery room in stable condition. At the end of the procedure all sponge, needle and instrument counts were correct.  My nurse practitioner was scrubbed for the entirety of the case and helped with the exposure, the decompression, the placement of the grafts, the placement of the plate, and the closure.   PLAN OF CARE: Admit for overnight observation  PATIENT DISPOSITION:  PACU - hemodynamically stable.   Delay start of Pharmacological VTE agent (>24hrs) due to surgical blood loss or risk of bleeding:  yes

## 2023-12-14 NOTE — H&P (Signed)
 Subjective:   Patient is a 68 y.o. male admitted for neck pain. The patient first presented to me with complaints of neck pain and shooting pains in the arm(s). Onset of symptoms was several months ago. The pain is described as aching and stabbing and occurs all day. The pain is rated severe, and is located in the neck and radiates to the arms. The symptoms have been progressive. Symptoms are exacerbated by extending head backwards, and are relieved by none.  Previous work up includes MRI of cervical spine, results: spinal stenosis.  Past Medical History:  Diagnosis Date   Bleeding hemorrhoid 10/27/2017   Cancer [facility])    prostate cancer   Cervical disc disorder with radiculopathy    right arm numbness   Cervical stenosis of spine    Diabetes mellitus without complication (HCC)    Hypertension    Incontinence    Lipidemia    Nerve pain due to spinal stenosis    Sleep apnea 10/14/2011   STOP BANG SCORE 4   Vertigo     Past Surgical History:  Procedure Laterality Date   ANTERIOR CERVICAL DECOMP/DISCECTOMY FUSION N/A 04/13/2019   Procedure: ANTERIOR CERVICAL DECOMPRESSION FUSION CERVICAL FIVE-SIX,CERVICAL SIX-SEVEN;  Surgeon: Joshua Alm RAMAN, MD;  Location: [facility] OR;  Service: Neurosurgery;  Laterality: N/A;  anterior   bilateral jaw surgery     40+ yrs ago- for TMJ PROBLEMS AND WISDOM TEETH IMPACTIONS   PROSTATE SURGERY     ROBOT ASSISTED LAPAROSCOPIC RADICAL PROSTATECTOMY  10/22/2011   Procedure: ROBOTIC ASSISTED LAPAROSCOPIC RADICAL PROSTATECTOMY;  Surgeon: Toribio Neysa Repine, MD;  Location: [facility] ORS;  Service: Urology;  Laterality: N/A;      TONSILLECTOMY     UMBILICAL HERNIA REPAIR  2003   VASECTOMY      Allergies  Allergen Reactions   Chlorhexidine  Hives and Other (See Comments)    Reaction to CHG surgical wipes only Patient also had redness at site.    Social History   Tobacco Use   Smoking status: Never   Smokeless tobacco: Never  Substance Use Topics   Alcohol use:  Yes    Comment: OCCAS BEER    Family History  Problem Relation Age of Onset   Cancer Mother        lung   Cancer Father        prostate   Diabetes Father    Heart disease Father    Hyperlipidemia Father    Hypertension Father    Cancer Maternal Grandmother        breast   Colon cancer Neg Hx    Colon polyps Neg Hx    Esophageal cancer Neg Hx    Stomach cancer Neg Hx    Rectal cancer Neg Hx    Prior to Admission medications   Medication Sig Start Date End Date Taking? Authorizing Provider  amLODipine  (NORVASC ) 5 MG tablet Take 1 tablet by mouth once daily for blood pressure 07/31/23  Yes Stacks, Butler, MD  atorvastatin  (LIPITOR) 40 MG tablet Take 1 tablet (40 mg total) by mouth daily. for cholesterol. 07/31/23  Yes Zollie Butler, MD  fenofibrate  160 MG tablet Take 1 tablet (160 mg total) by mouth daily. For cholesterol and triglyceride 07/31/23  Yes Stacks, Butler, MD  glimepiride  (AMARYL ) 2 MG tablet Take 1 tablet (2 mg total) by mouth in the morning and at bedtime. 07/31/23  Yes StacksButler, MD  lisinopril -hydrochlorothiazide  (ZESTORETIC ) 20-25 MG tablet Take 1 tablet by mouth daily. 07/31/23  Yes Stacks,  Butler, MD  metFORMIN  (GLUCOPHAGE -XR) 750 MG 24 hr tablet Take 1 tablet (750 mg total) by mouth 2 (two) times daily with a meal. 07/31/23  Yes Zollie Butler, MD  metoprolol  tartrate (LOPRESSOR ) 100 MG tablet Take 1 tablet (100 mg total) by mouth 2 (two) times daily. 07/31/23  Yes Zollie Butler, MD  ofloxacin  (OCUFLOX ) 0.3 % ophthalmic solution Place 2 drops into the right eye 4 (four) times daily. 12/07/23  Yes Joesph Annabella HERO, FNP     Review of Systems  Positive ROS: neg  All other systems have been reviewed and were otherwise negative with the exception of those mentioned in the HPI and as above.  Objective: Vital signs in last 24 hours: Temp:  [98.2 F (36.8 C)] 98.2 F (36.8 C) (06/30 1109) Pulse Rate:  [65] 65 (06/30 1109) Resp:  [18] 18 (06/30 1109) BP:  (148)/(88) 148/88 (06/30 1109) SpO2:  [94 %] 94 % (06/30 1109) Weight:  [111.1 kg] 111.1 kg (06/30 1109)  General Appearance: Alert, cooperative, no distress, appears stated age Head: Normocephalic, without obvious abnormality, atraumatic Eyes: PERRL, conjunctiva/corneas clear, EOM's intact      Neck: Supple, symmetrical, trachea midline, Back: Symmetric, no curvature, ROM normal, no CVA tenderness Lungs:  respirations unlabored Heart: Regular rate and rhythm Abdomen: Soft, non-tender Extremities: Extremities normal, atraumatic, no cyanosis or edema Pulses: 2+ and symmetric all extremities Skin: Skin color, texture, turgor normal, no rashes or lesions  NEUROLOGIC:  Mental status: Alert and oriented x4, no aphasia, good attention span, fund of knowledge and memory  Motor Exam - grossly normal Sensory Exam - grossly normal Reflexes: 1+ Coordination - grossly normal Gait - grossly normal Balance - grossly normal Cranial Nerves: I: smell Not tested  II: visual acuity  OS: nl    OD: nl  II: visual fields Full to confrontation  II: pupils Equal, round, reactive to light  III,VII: ptosis None  III,IV,VI: extraocular muscles  Full ROM  V: mastication Normal  V: facial light touch sensation  Normal  V,VII: corneal reflex  Present  VII: facial muscle function - upper  Normal  VII: facial muscle function - lower Normal  VIII: hearing Not tested  IX: soft palate elevation  Normal  IX,X: gag reflex Present  XI: trapezius strength  5/5  XI: sternocleidomastoid strength 5/5  XI: neck flexion strength  5/5  XII: tongue strength  Normal    Data Review Lab Results  Component Value Date   WBC 8.7 12/03/2023   HGB 14.1 12/03/2023   HCT 43.9 12/03/2023   MCV 85.1 12/03/2023   PLT 295 12/03/2023   Lab Results  Component Value Date   NA 139 12/03/2023   K 3.7 12/03/2023   CL 100 12/03/2023   CO2 29 12/03/2023   BUN 26 (H) 12/03/2023   CREATININE 0.97 12/03/2023   GLUCOSE 210  (H) 12/03/2023   Lab Results  Component Value Date   INR 1.0 04/11/2019    Assessment:   Cervical neck pain with herniated nucleus pulposus/ spondylosis/ stenosis at C3-4 C4-5. Estimated body mass index is 37.25 kg/m as calculated from the following:   Height as of this encounter: 5' 8 (1.727 m).   Weight as of this encounter: 111.1 kg.  Patient has failed conservative therapy. Planned surgery : ACDF C3-4 c4-5  Plan:   I explained the condition and procedure to the patient and answered any questions.  Patient wishes to proceed with procedure as planned. Understands risks/ benefits/ and expected or  typical outcomes.  Alm GORMAN Molt 12/14/2023 12:21 PM

## 2023-12-14 NOTE — Anesthesia Procedure Notes (Signed)
 Procedure Name: Intubation Date/Time: 12/14/2023 1:40 PM  Performed by: Mathew Bernardino RAMAN, RNPre-anesthesia Checklist: Patient identified, Emergency Drugs available, Suction available and Patient being monitored Patient Re-evaluated:Patient Re-evaluated prior to induction Oxygen  Delivery Method: Circle System Utilized Preoxygenation: Pre-oxygenation with 100% oxygen  Induction Type: IV induction Ventilation: Two handed mask ventilation required and Mask ventilation with difficulty Laryngoscope Size: Glidescope and 4 Grade View: Grade I Tube type: Oral Tube size: 7.5 mm Number of attempts: 1 Airway Equipment and Method: Stylet Placement Confirmation: ETT inserted through vocal cords under direct vision, positive ETCO2 and breath sounds checked- equal and bilateral Secured at: 24 cm Tube secured with: Tape Dental Injury: Teeth and Oropharynx as per pre-operative assessment

## 2023-12-15 ENCOUNTER — Encounter (HOSPITAL_COMMUNITY): Payer: Self-pay | Admitting: Neurological Surgery

## 2023-12-15 DIAGNOSIS — M4722 Other spondylosis with radiculopathy, cervical region: Secondary | ICD-10-CM | POA: Diagnosis not present

## 2023-12-15 LAB — GLUCOSE, CAPILLARY: Glucose-Capillary: 189 mg/dL — ABNORMAL HIGH (ref 70–99)

## 2023-12-15 MED ORDER — METHOCARBAMOL 500 MG PO TABS: 500.0000 mg | ORAL_TABLET | Freq: Four times a day (QID) | ORAL | 0 refills | Status: DC | PRN

## 2023-12-15 MED ORDER — OXYCODONE-ACETAMINOPHEN 7.5-325 MG PO TABS: 1.0000 | ORAL_TABLET | ORAL | 0 refills | Status: DC | PRN

## 2023-12-15 MED FILL — Thrombin For Soln 5000 Unit: CUTANEOUS | Qty: 2 | Status: AC

## 2023-12-15 NOTE — Discharge Summary (Signed)
 Physician Discharge Summary  Patient ID: Adam Sutton MRN: [id number] DOB/AGE: Mar 01, 1956 68 y.o.  Admit date: 12/14/2023 Discharge date: 12/15/2023  Admission Diagnoses: Cervical spondylosis with cervical spinal stenosis C3-4 C4-5 with neck pain and radiculopathy     Discharge Diagnoses: same   Discharged Condition: good  Hospital Course: The patient was admitted on 12/14/2023 and taken to the operating room where the patient underwent ACDF C3-4, C4-5. The patient tolerated the procedure well and was taken to the recovery room and then to the floor in stable condition. The hospital course was routine. There were no complications. The wound remained clean dry and intact. Pt had appropriate neck soreness. No complaints of arm pain or new N/T/W. The patient remained afebrile with stable vital signs, and tolerated a regular diet. The patient continued to increase activities, and pain was well controlled with oral pain medications.   Consults: None  Significant Diagnostic Studies:  Results for orders placed or performed during the hospital encounter of 12/14/23  Glucose, capillary   Collection Time: 12/14/23 11:16 AM  Result Value Ref Range   Glucose-Capillary 205 (H) 70 - 99 mg/dL  Glucose, capillary   Collection Time: 12/14/23  2:24 PM  Result Value Ref Range   Glucose-Capillary 178 (H) 70 - 99 mg/dL  Glucose, capillary   Collection Time: 12/14/23  3:37 PM  Result Value Ref Range   Glucose-Capillary 175 (H) 70 - 99 mg/dL  Hemoglobin J8r   Collection Time: 12/14/23  6:07 PM  Result Value Ref Range   Hgb A1c MFr Bld 10.1 (H) 4.8 - 5.6 %   Mean Plasma Glucose 243.17 mg/dL  Glucose, capillary   Collection Time: 12/14/23  9:29 PM  Result Value Ref Range   Glucose-Capillary 247 (H) 70 - 99 mg/dL   Comment 1 Notify RN    Comment 2 Document in Chart   Glucose, capillary   Collection Time: 12/15/23  5:56 AM  Result Value Ref Range   Glucose-Capillary 189 (H) 70 - 99 mg/dL    Comment 1 Notify RN    Comment 2 Document in Chart     DG Cervical Spine 1 View Result Date: 12/14/2023 CLINICAL DATA:  Elective surgery. EXAM: DG CERVICAL SPINE - 1 VIEW COMPARISON:  11/11/2023 FINDINGS: Single lateral fluoroscopic spot view of the cervical spine submitted from the operating room. Previous anterior C5 through C7 fusion with interbody spacers. There is new anterior fusion C3 through C5 with interbody spacers. Fluoroscopy time 11 seconds. Dose 2.57 mGy. IMPRESSION: Intraoperative fluoroscopy during cervical fusion. Electronically Signed   By: Andrea Gasman M.D.   On: 12/14/2023 17:20   DG C-Arm 1-60 Min-No Report Result Date: 12/14/2023 Fluoroscopy was utilized by the requesting physician.  No radiographic interpretation.   DG C-Arm 1-60 Min-No Report Result Date: 12/14/2023 Fluoroscopy was utilized by the requesting physician.  No radiographic interpretation.   CT ABDOMEN PELVIS W CONTRAST Result Date: 12/12/2023 CLINICAL DATA:  Right-sided abdominal pain for approximately 2 years. Prior prostate carcinoma. * Tracking Code: BO * EXAM: CT ABDOMEN AND PELVIS WITH CONTRAST TECHNIQUE: Multidetector CT imaging of the abdomen and pelvis was performed using the standard protocol following bolus administration of intravenous contrast. RADIATION DOSE REDUCTION: This exam was performed according to the departmental dose-optimization program which includes automated exposure control, adjustment of the mA and/or kV according to patient size and/or use of iterative reconstruction technique. CONTRAST:  100mL OMNIPAQUE IOHEXOL 300 MG/ML  SOLN COMPARISON:  None Available. FINDINGS: Lower Chest: No acute findings. Hepatobiliary:  No suspicious hepatic masses identified. Gallbladder is unremarkable. No evidence of biliary ductal dilatation. Pancreas:  No mass or inflammatory changes. Spleen: Within normal limits in size and appearance. Adrenals/Urinary Tract: No suspicious masses identified. Small  right lower pole renal cyst noted (No followup imaging is recommended). Several tiny renal calculi are seen, largest measuring 4 mm in right renal collecting system. No evidence of ureteral calculi or hydronephrosis. Unremarkable unopacified urinary bladder. Stomach/Bowel: No evidence of obstruction, inflammatory process or abnormal fluid collections. Normal appendix visualized. Vascular/Lymphatic: No pathologically enlarged lymph nodes. No acute vascular findings. Reproductive: Prior prostatectomy noted, with unremarkable appearance of prostatectomy bed. Other:  None. Musculoskeletal:  No suspicious bone lesions identified. IMPRESSION: No acute findings. Bilateral nephrolithiasis. No evidence of ureteral calculi or hydronephrosis. Prior prostatectomy. No evidence of metastatic disease. Electronically Signed   By: Norleen DELENA Kil M.D.   On: 12/12/2023 16:16    Antibiotics:  Anti-infectives (From admission, onward)    Start     Dose/Rate Route Frequency Ordered Stop   12/14/23 2130  ceFAZolin  (ANCEF ) IVPB 2g/100 mL premix        2 g 200 mL/hr over 30 Minutes Intravenous Every 8 hours 12/14/23 1707 12/15/23 0600   12/14/23 1130  ceFAZolin  (ANCEF ) IVPB 2g/100 mL premix        2 g 200 mL/hr over 30 Minutes Intravenous On call to O.R. 12/14/23 1115 12/14/23 1420       Discharge Exam: Blood pressure (!) 138/91, pulse 63, temperature 98 F (36.7 C), temperature source Oral, resp. rate 18, height 5' 8 (1.727 m), weight 111.1 kg, SpO2 97%. Neurologic: Grossly normal Ambulating and voiding well incision cdi   Discharge Medications:   Allergies as of 12/15/2023       Reactions   Chlorhexidine  Hives, Other (See Comments)   Reaction to CHG surgical wipes only Patient also had redness at site.        Medication List     TAKE these medications    amLODipine  5 MG tablet Commonly known as: NORVASC  Take 1 tablet by mouth once daily for blood pressure   atorvastatin  40 MG tablet Commonly known  as: LIPITOR Take 1 tablet (40 mg total) by mouth daily. for cholesterol.   fenofibrate  160 MG tablet Take 1 tablet (160 mg total) by mouth daily. For cholesterol and triglyceride   glimepiride  2 MG tablet Commonly known as: AMARYL  Take 1 tablet (2 mg total) by mouth in the morning and at bedtime.   lisinopril -hydrochlorothiazide  20-25 MG tablet Commonly known as: ZESTORETIC  Take 1 tablet by mouth daily.   metFORMIN  750 MG 24 hr tablet Commonly known as: GLUCOPHAGE -XR Take 1 tablet (750 mg total) by mouth 2 (two) times daily with a meal.   methocarbamol  500 MG tablet Commonly known as: ROBAXIN  Take 1 tablet (500 mg total) by mouth every 6 (six) hours as needed for muscle spasms.   metoprolol  tartrate 100 MG tablet Commonly known as: LOPRESSOR  Take 1 tablet (100 mg total) by mouth 2 (two) times daily.   ofloxacin  0.3 % ophthalmic solution Commonly known as: Ocuflox  Place 2 drops into the right eye 4 (four) times daily.   oxyCODONE -acetaminophen  7.5-325 MG tablet Commonly known as: Percocet Take 1 tablet by mouth every 4 (four) hours as needed for severe pain (pain score 7-10).        Disposition: home   Final Dx: ACDF C3-4, C4-5  Discharge Instructions      Remove dressing in 72 hours   Complete by:  As directed    Call MD for:   Complete by: As directed    Call MD for:  difficulty breathing, headache or visual disturbances   Complete by: As directed    Call MD for:  hives   Complete by: As directed    Call MD for:  persistant dizziness or light-headedness   Complete by: As directed    Call MD for:  persistant nausea and vomiting   Complete by: As directed    Call MD for:  redness, tenderness, or signs of infection (pain, swelling, redness, odor or green/yellow discharge around incision site)   Complete by: As directed    Call MD for:  severe uncontrolled pain   Complete by: As directed    Call MD for:  temperature >100.4   Complete by: As directed    Diet -  low sodium heart healthy   Complete by: As directed    Driving Restrictions   Complete by: As directed    No driving for 2 weeks, no riding in the car for 1 week   Increase activity slowly   Complete by: As directed    Lifting restrictions   Complete by: As directed    No lifting more than 8 lbs          Signed: Suzen Lacks Enzley Kitchens 12/15/2023, 7:36 AM

## 2023-12-15 NOTE — Evaluation (Signed)
 Occupational Therapy Evaluation Patient Details Name: Adam Sutton MRN: [id number] DOB: 1955-12-15 Today's Date: 12/15/2023   History of Present Illness   Adam Sutton is a 68 yo male who is s/p ACDF C3-4, C4-5. PMHx: cancer, DMII, HTN, lipidemia, sleep apnea, vertigo     Clinical Impressions [name] was evaluated s/p the above spine surgery. He is indep at baseline. Upon evaluation pt was limited by surgical and throat pain, cervical precautions, decreased activity tolerance and lack of sleep. Overall he demonstrated mod I ability to complete ADLs and mobility without DME. Provided cues and education on spinal precautions and compensatory techniques throughout, handout provided and pt demonstrated great recall during ADLs and mobility. Pt does not require further acute OT services. Recommend d/c home with support of family.       If plan is discharge home, recommend the following:   Help with stairs or ramp for entrance;Assist for transportation;Assistance with cooking/housework     Functional Status Assessment   Patient has had a recent decline in their functional status and demonstrates the ability to make significant improvements in function in a reasonable and predictable amount of time.     Equipment Recommendations   None recommended by OT      Precautions/Restrictions   Precautions Precautions: Fall;Cervical Precaution Booklet Issued: No Restrictions Weight Bearing Restrictions Per Provider Order: No     Mobility Bed Mobility Overal bed mobility: Modified Independent             General bed mobility comments: HOH slightly elevated, cues for log roll. Pt states he plans to sleep in a recliner at discharge    Transfers Overall transfer level: Modified independent                        Balance Overall balance assessment: Modified Independent         ADL either performed or assessed with clinical judgement   ADL Overall ADL's :  Needs assistance/impaired             General ADL Comments: mod I for all ADLs after review of cervical precuations, pt with great return demonstrationg. No DME needed.     Vision Baseline Vision/History: 0 No visual deficits Vision Assessment?: No apparent visual deficits     Perception Perception: Within Functional Limits       Praxis Praxis: WFL       Pertinent Vitals/Pain Pain Assessment Pain Assessment: Faces Faces Pain Scale: Hurts a little bit Pain Location: throat Pain Descriptors / Indicators: Discomfort Pain Intervention(s): Limited activity within patient's tolerance, Monitored during session     Extremity/Trunk Assessment Upper Extremity Assessment Upper Extremity Assessment: Overall WFL for tasks assessed (limited overhead ROM due to cervical sx)   Lower Extremity Assessment Lower Extremity Assessment: Overall WFL for tasks assessed   Cervical / Trunk Assessment Cervical / Trunk Assessment: Neck Surgery   Communication Communication Communication: No apparent difficulties   Cognition Arousal: Alert Behavior During Therapy: WFL for tasks assessed/performed Cognition: No apparent impairments         Following commands: Intact       Cueing  General Comments   Cueing Techniques: Verbal cues  VSS, family present and supportive    Home Living Family/patient expects to be discharged to:: Private residence Living Arrangements: Spouse/significant other Available Help at Discharge: Family;Available 24 hours/day;Available PRN/intermittently Type of Home: House Home Access: Stairs to enter [employer] of Steps: 13 Entrance Stairs-Rails: Right;Left Home Layout: One level  Bathroom Shower/Tub: Producer, television/film/video: Standard     Home Equipment: None   Additional Comments: wife can be there 24/7 if needed      Prior Functioning/Environment Prior Level of Function : Independent/Modified Independent;Driving              Mobility Comments: indep, no AD ADLs Comments: indep, drives    OT Problem List: Decreased activity tolerance;Decreased knowledge of precautions        OT Goals(Current goals can be found in the care plan section)   Acute Rehab OT Goals Patient Stated Goal: home OT Goal Formulation: With patient Time For Goal Achievement: 12/15/23 Potential to Achieve Goals: Good   AM-PAC OT 6 Clicks Daily Activity     Outcome Measure Help from another person eating meals?: None Help from another person taking care of personal grooming?: None Help from another person toileting, which includes using toliet, bedpan, or urinal?: None Help from another person bathing (including washing, rinsing, drying)?: None Help from another person to put on and taking off regular upper body clothing?: None Help from another person to put on and taking off regular lower body clothing?: None 6 Click Score: 24   End of Session Nurse Communication: Mobility status  Activity Tolerance: Patient tolerated treatment well Patient left: in bed;with call bell/phone within reach;with family/visitor present  OT Visit Diagnosis: Pain                Time: 9063-9049 OT Time Calculation (min): 14 min Charges:  OT General Charges $OT Visit: 1 Visit OT Evaluation $OT Eval Low Complexity: 1 Low  Lucie Kendall, OTR/L Acute Rehabilitation Services Office [phone] Secure Chat Communication Preferred   Lucie JONETTA Kendall 12/15/2023, 10:10 AM

## 2024-01-14 ENCOUNTER — Other Ambulatory Visit: Payer: Self-pay | Admitting: Family Medicine

## 2024-01-28 ENCOUNTER — Ambulatory Visit: Admitting: Family Medicine

## 2024-01-28 ENCOUNTER — Encounter: Payer: Self-pay | Admitting: Family Medicine

## 2024-01-28 VITALS — BP 137/82 | HR 63 | Ht 68.0 in | Wt 240.6 lb

## 2024-01-28 DIAGNOSIS — Z7984 Long term (current) use of oral hypoglycemic drugs: Secondary | ICD-10-CM

## 2024-01-28 DIAGNOSIS — I152 Hypertension secondary to endocrine disorders: Secondary | ICD-10-CM

## 2024-01-28 DIAGNOSIS — E782 Mixed hyperlipidemia: Secondary | ICD-10-CM | POA: Diagnosis not present

## 2024-01-28 DIAGNOSIS — E119 Type 2 diabetes mellitus without complications: Secondary | ICD-10-CM

## 2024-01-28 DIAGNOSIS — I10 Essential (primary) hypertension: Secondary | ICD-10-CM | POA: Diagnosis not present

## 2024-01-28 DIAGNOSIS — E1159 Type 2 diabetes mellitus with other circulatory complications: Secondary | ICD-10-CM

## 2024-01-28 LAB — BAYER DCA HB A1C WAIVED: HB A1C (BAYER DCA - WAIVED): 8.7 % — ABNORMAL HIGH (ref 4.8–5.6)

## 2024-01-29 LAB — CMP14+EGFR
ALT: 22 IU/L (ref 0–44)
AST: 18 IU/L (ref 0–40)
Albumin: 4 g/dL (ref 3.9–4.9)
Alkaline Phosphatase: 98 IU/L (ref 44–121)
BUN/Creatinine Ratio: 22 (ref 10–24)
BUN: 25 mg/dL (ref 8–27)
Bilirubin Total: 0.7 mg/dL (ref 0.0–1.2)
CO2: 27 mmol/L (ref 20–29)
Calcium: 9.7 mg/dL (ref 8.6–10.2)
Chloride: 93 mmol/L — ABNORMAL LOW (ref 96–106)
Creatinine, Ser: 1.12 mg/dL (ref 0.76–1.27)
Globulin, Total: 2.6 g/dL (ref 1.5–4.5)
Glucose: 171 mg/dL — ABNORMAL HIGH (ref 70–99)
Potassium: 4.1 mmol/L (ref 3.5–5.2)
Sodium: 137 mmol/L (ref 134–144)
Total Protein: 6.6 g/dL (ref 6.0–8.5)
eGFR: 71 mL/min/1.73 (ref >59)

## 2024-01-29 LAB — CBC WITH DIFFERENTIAL/PLATELET
Basophils Absolute: 0.1 x10E3/uL (ref 0.0–0.2)
Basos: 1 %
EOS (ABSOLUTE): 0.2 x10E3/uL (ref 0.0–0.4)
Eos: 2 %
Hematocrit: 47.2 % (ref 37.5–51.0)
Hemoglobin: 14.7 g/dL (ref 13.0–17.7)
Immature Grans (Abs): 0 x10E3/uL (ref 0.0–0.1)
Immature Granulocytes: 0 %
Lymphocytes Absolute: 2.4 x10E3/uL (ref 0.7–3.1)
Lymphs: 32 %
MCH: 27.1 pg (ref 26.6–33.0)
MCHC: 31.1 g/dL — ABNORMAL LOW (ref 31.5–35.7)
MCV: 87 fL (ref 79–97)
Monocytes Absolute: 0.7 x10E3/uL (ref 0.1–0.9)
Monocytes: 9 %
Neutrophils Absolute: 4.2 x10E3/uL (ref 1.4–7.0)
Neutrophils: 56 %
Platelets: 324 x10E3/uL (ref 150–450)
RBC: 5.43 x10E6/uL (ref 4.14–5.80)
RDW: 13.5 % (ref 11.6–15.4)
WBC: 7.5 x10E3/uL (ref 3.4–10.8)

## 2024-01-29 LAB — LIPID PANEL
Chol/HDL Ratio: 5.1 ratio — ABNORMAL HIGH (ref 0.0–5.0)
Cholesterol, Total: 123 mg/dL (ref 100–199)
HDL: 24 mg/dL — ABNORMAL LOW (ref >39)
LDL Chol Calc (NIH): 37 mg/dL (ref 0–99)
Triglycerides: 428 mg/dL — ABNORMAL HIGH (ref 0–149)
VLDL Cholesterol Cal: 62 mg/dL — ABNORMAL HIGH (ref 5–40)

## 2024-01-31 NOTE — Progress Notes (Signed)
 Subjective:  Patient ID: Fields Adam Sutton, male    DOB: 08-Feb-1956  Age: 68 y.o. MRN: [id number]  CC: 3 MONTH FOLLOW UP   HPI  Discussed the use of AI scribe software for clinical note transcription with the patient, who gave verbal consent to proceed.  History of Present Illness   Adam Sutton is a 68 year old male with diabetes who presents for follow-up on his diabetes management and post-surgical recovery.  His recent A1c is 8.7. He has made dietary modifications, such as reducing bread and occasionally consuming potatoes, but still enjoys desserts, opting for sugar-free options when possible. He is currently taking glimepiride , amlodipine , Zestoretic , atorvastatin , and fenofibrate . Financial constraints are affecting his medication choices.  He is recovering from neck surgery performed six weeks ago. He experiences persistent stiffness and cracking in his neck, described as 'so loud in my brain.' There is some improvement, but significant swelling persists. He has not been recommended physical therapy and is not currently driving due to limited neck mobility.  He mentions sinus problems post-surgery, with symptoms including a change in voice, phlegm in the throat, and popping sounds in his nose, which disturb his sleep.  He is awaiting another surgery to address incontinence, which requires his A1c to be at 7.5 or below. He expresses concern about the cost of medications and the financial burden of his recent spinal surgery.  No current use of oxycodone  or Percocet, having been prescribed it post-surgery but using it sparingly due to fear of side effects. No regular consumption of corn, rice, or potatoes, with occasional potato intake.             01/28/2024    9:07 AM 12/07/2023   10:03 AM 10/28/2023    9:17 AM  Depression screen PHQ 2/9  Decreased Interest 0 0 0  Down, Depressed, Hopeless 0 0 0  PHQ - 2 Score 0 0 0  Altered sleeping  0 0  Tired, decreased energy  0 0   Change in appetite  0 0  Feeling bad or failure about yourself   0 0  Trouble concentrating  0 0  Moving slowly or fidgety/restless  0 0  Suicidal thoughts  0 0  PHQ-9 Score  0 0  Difficult doing work/chores  Not difficult at all Not difficult at all    History Verdell has a past medical history of Bleeding hemorrhoid (10/27/2017), Cancer [facility]), Cervical disc disorder with radiculopathy, Cervical stenosis of spine, Diabetes mellitus without complication (HCC), Hypertension, Incontinence, Lipidemia, Nerve pain due to spinal stenosis, Sleep apnea (10/14/2011), and Vertigo.   He has a past surgical history that includes Umbilical hernia repair (2003); bilateral jaw surgery; Robot assisted laparoscopic radical prostatectomy (10/22/2011); Prostate surgery; Vasectomy; Tonsillectomy; Anterior cervical decomp/discectomy fusion (N/A, 04/13/2019); and Anterior cervical decomp/discectomy fusion (N/A, 12/14/2023).   His family history includes Cancer in his father, maternal grandmother, and mother; Diabetes in his father; Heart disease in his father; Hyperlipidemia in his father; Hypertension in his father.He reports that he has never smoked. He has never used smokeless tobacco. He reports current alcohol use. He reports that he does not use drugs.    ROS Review of Systems  Constitutional: Negative.   HENT: Negative.    Eyes:  Negative for visual disturbance.  Respiratory:  Negative for cough and shortness of breath.   Cardiovascular:  Negative for chest pain and leg swelling.  Gastrointestinal:  Negative for abdominal pain, diarrhea, nausea and vomiting.  Genitourinary:  Negative for difficulty  urinating.  Musculoskeletal:  Negative for arthralgias and myalgias.  Skin:  Negative for rash.  Neurological:  Negative for headaches.  Psychiatric/Behavioral:  Negative for sleep disturbance.     Objective:  BP 137/82   Pulse 63   Ht 5' 8 (1.727 m)   Wt 240 lb 9.6 oz (109.1 kg)   SpO2 97%   BMI  36.58 kg/m   BP Readings from Last 3 Encounters:  01/28/24 137/82  12/15/23 131/87  12/07/23 133/74    Wt Readings from Last 3 Encounters:  01/28/24 240 lb 9.6 oz (109.1 kg)  12/14/23 245 lb (111.1 kg)  12/07/23 245 lb 12.8 oz (111.5 kg)     Physical Exam Constitutional:      General: He is not in acute distress.    Appearance: He is well-developed.  HENT:     Head: Normocephalic and atraumatic.     Right Ear: External ear normal.     Left Ear: External ear normal.     Nose: Nose normal.  Eyes:     Conjunctiva/sclera: Conjunctivae normal.     Pupils: Pupils are equal, round, and reactive to light.  Cardiovascular:     Rate and Rhythm: Normal rate and regular rhythm.     Heart sounds: Normal heart sounds. No murmur heard. Pulmonary:     Effort: Pulmonary effort is normal. No respiratory distress.     Breath sounds: Normal breath sounds. No wheezing or rales.  Abdominal:     Palpations: Abdomen is soft.     Tenderness: There is no abdominal tenderness.  Musculoskeletal:        General: Normal range of motion.     Cervical back: Normal range of motion and neck supple.  Skin:    General: Skin is warm and dry.  Neurological:     Mental Status: He is alert and oriented to person, place, and time.     Deep Tendon Reflexes: Reflexes are normal and symmetric.  Psychiatric:        Behavior: Behavior normal.        Thought Content: Thought content normal.        Judgment: Judgment normal.      Assessment & Plan:  Essential hypertension  Hypertension associated with diabetes (HCC) -     Bayer DCA Hb A1c Waived -     CBC with Differential/Platelet -     CMP14+EGFR  Mixed hyperlipidemia -     Lipid panel  Diabetes mellitus without complication (HCC) -     Bayer DCA Hb A1c Waived    Assessment and Plan    Type 2 diabetes mellitus Elevated A1c at 8.7 indicates poor glycemic control and increased cardiovascular risk. Financial constraints limit medication  options. - Increase glimepiride  to 4 mg daily. - Advise dietary modifications: reduce desserts, bread, potatoes. - Educate on hypoglycemia risk with increased glimepiride , especially if meals are skipped or during illness. - Instruct to halve glimepiride  dose if vomiting or unable to eat. - Advise close blood glucose monitoring.  Postoperative neck stiffness and swelling Persistent neck stiffness and swelling six weeks post-surgery with limited movement and cracking sounds. Improvement noted but still present.  Postoperative nasal and throat congestion Nasal and throat congestion post-surgery likely due to anesthesia and surgical manipulation. Minor swelling in nasal passages observed. - Recommend over-the-counter Flonase nasal spray, two sprays in each nostril daily, preferably an hour before bedtime.  Hypertension Currently managed with amlodipine  and Zestoretic .  Hyperlipidemia Currently managed with atorvastatin  and fenofibrate .  Follow-up: Return in about 3 months (around 04/29/2024).  Butler Der, M.D.

## 2024-02-01 ENCOUNTER — Ambulatory Visit: Payer: Self-pay | Admitting: Family Medicine

## 2024-03-28 ENCOUNTER — Encounter: Payer: Self-pay | Admitting: Family Medicine

## 2024-03-28 ENCOUNTER — Ambulatory Visit (INDEPENDENT_AMBULATORY_CARE_PROVIDER_SITE_OTHER): Admitting: Family Medicine

## 2024-03-28 VITALS — BP 156/90 | HR 63 | Temp 98.2°F | Ht 68.0 in | Wt 246.0 lb

## 2024-03-28 DIAGNOSIS — H1031 Unspecified acute conjunctivitis, right eye: Secondary | ICD-10-CM | POA: Diagnosis not present

## 2024-03-28 MED ORDER — TOBRAMYCIN-DEXAMETHASONE 0.3-0.1 % OP SUSP: 1.0000 [drp] | Freq: Four times a day (QID) | OPHTHALMIC | 0 refills | Status: AC

## 2024-03-28 NOTE — Progress Notes (Signed)
 Chief Complaint  Patient presents with   eye infection    HPI  Patient presents today for Discussed the use of AI scribe software for clinical note transcription with the patient, who gave verbal consent to proceed.  History of Present Illness Adam Sutton is a 68 year old male with recurring eye infections who presents with an eye infection.  He has been experiencing an eye infection for the last two days, primarily affecting his right eye. Symptoms include pain, photophobia, and excessive tearing. He finds the condition manageable with leftover medication from a previous episode, but it still causes significant discomfort.  This is a recurring issue, occurring two to three times a year since 2005. He has used various medications in the past, including Pred Forte, prednisone , and ofloxacin , which have provided relief. He recalls a particular medication used in 2005 was especially effective, though he is unsure if it is still available.  The onset of the current episode occurred two days after mowing the lawn. He consistently uses goggles for safety during yard work, yet continues to experience these infections.  Touching his eyelid exacerbates the pain, and exposure to light, such as turning on a bathroom light at night, causes significant tearing.     PMH: Smoking status noted Review of Systems  Objective: BP (!) 156/90   Pulse 63   Temp 98.2 F (36.8 C)   Ht 5' 8 (1.727 m)   Wt 246 lb (111.6 kg)   SpO2 96%   BMI 37.40 kg/m  Gen: NAD, alert, cooperative with exam HEENT: NCAT, EOMI, PERRL conj. Moderately injected OD. Increased tearing CV: RRR, good S1/S2, no murmur Resp: CTABL, no wheezes, non-labored Ext: No edema, warm Neuro: Alert and oriented, No gross deficits  Acute bacterial conjunctivitis of right eye  Other orders -     Tobramycin-dexAMETHasone ; Place 1 drop into the right eye every 6 (six) hours. For 1 week  Dispense: 5 mL; Refill: 0    Assessment  and Plan Assessment & Plan Recurrent right eye infection   He experiences recurrent right eye infections two to three times annually since 2005. The current episode began two days ago with pain, photophobia, tearing, and redness in the right eye. He uses leftover medication from previous episodes for relief. The infection may be linked to allergens like ragweed, goldenrod, or milkweed, especially during high allergy seasons. Use of safety goggles while mowing suggests inhaled allergens as a potential cause. Advised against using steroids alone due to the risk of exacerbating bacterial infections. Prescribe combination eye drops containing both an antibiotic and a steroid,  Instruct him to inform if the copay is prohibitively expensive, so an alternative can be arranged.      Butler Der, MD

## 2024-04-18 ENCOUNTER — Other Ambulatory Visit: Payer: Self-pay | Admitting: *Deleted

## 2024-05-02 ENCOUNTER — Encounter: Payer: Self-pay | Admitting: Family Medicine

## 2024-05-02 ENCOUNTER — Ambulatory Visit: Payer: Self-pay | Admitting: Family Medicine

## 2024-05-02 VITALS — BP 139/71 | HR 57 | Temp 98.0°F | Ht 68.0 in | Wt 245.0 lb

## 2024-05-02 DIAGNOSIS — I10 Essential (primary) hypertension: Secondary | ICD-10-CM

## 2024-05-02 DIAGNOSIS — Z6837 Body mass index (BMI) 37.0-37.9, adult: Secondary | ICD-10-CM

## 2024-05-02 DIAGNOSIS — E1159 Type 2 diabetes mellitus with other circulatory complications: Secondary | ICD-10-CM

## 2024-05-02 DIAGNOSIS — I152 Hypertension secondary to endocrine disorders: Secondary | ICD-10-CM

## 2024-05-02 DIAGNOSIS — E782 Mixed hyperlipidemia: Secondary | ICD-10-CM

## 2024-05-02 DIAGNOSIS — E119 Type 2 diabetes mellitus without complications: Secondary | ICD-10-CM

## 2024-05-02 LAB — LIPID PANEL

## 2024-05-02 LAB — BAYER DCA HB A1C WAIVED: HB A1C (BAYER DCA - WAIVED): 8.1 % — ABNORMAL HIGH (ref 4.8–5.6)

## 2024-05-02 MED ORDER — SITAGLIPTIN PHOSPHATE 100 MG PO TABS: 100.0000 mg | ORAL_TABLET | Freq: Every day | ORAL | 3 refills | Status: DC

## 2024-05-02 MED ORDER — LISINOPRIL-HYDROCHLOROTHIAZIDE 20-25 MG PO TABS: 1.0000 | ORAL_TABLET | Freq: Every day | ORAL | 3 refills | Status: AC

## 2024-05-02 MED ORDER — ATORVASTATIN CALCIUM 40 MG PO TABS: 40.0000 mg | ORAL_TABLET | Freq: Every day | ORAL | 3 refills | Status: AC

## 2024-05-02 MED ORDER — METFORMIN HCL ER 750 MG PO TB24: 750.0000 mg | ORAL_TABLET | Freq: Two times a day (BID) | ORAL | 3 refills | Status: AC

## 2024-05-02 MED ORDER — METOPROLOL TARTRATE 100 MG PO TABS: 100.0000 mg | ORAL_TABLET | Freq: Two times a day (BID) | ORAL | 3 refills | Status: AC

## 2024-05-02 MED ORDER — AMLODIPINE BESYLATE 5 MG PO TABS: ORAL_TABLET | ORAL | 3 refills | Status: AC

## 2024-05-02 MED ORDER — FENOFIBRATE 160 MG PO TABS: 160.0000 mg | ORAL_TABLET | Freq: Every day | ORAL | 3 refills | Status: AC

## 2024-05-02 NOTE — Progress Notes (Signed)
 Subjective:  Patient ID: Adam Sutton, male    DOB: Aug 06, 1955  Age: 68 y.o. MRN: [id number]  CC: Follow-up (Forehead breaking out for the past 3 months.No itching or painful just cosmetic. No new products. )   HPI  Discussed the use of AI scribe software for clinical note transcription with the patient, who gave verbal consent to proceed.  History of Present Illness Adam Sutton is a 68 year old male who presents for management of hypertension and hyperlipidemia.  He has ongoing issues with hypertension and hyperlipidemia. His current medications include metoprolol  100 mcg twice a day, amlodipine , atorvastatin , fenofibrate , and a combination of lisinopril  with hydrochlorothiazide . His blood pressure was recently measured at 139/74. He does not regularly check his blood pressure at home.  He has poorly controlled blood sugars with recent readings of 218 and 209 mg/dL. He is taking glimepiride  and metformin  twice daily. He mentions a third medication taken twice daily, which is likely metoprolol . He has previously tried Rybelsus  and Onglyza, but is not currently on these medications.  He describes ongoing pain and numbness in his right arm following a second spinal surgery, which involved the placement of nine titanium screws. The surgery initially aimed to address numbness in his left arm, which was successful, but subsequently resulted in right arm pain. He experiences constant pain and difficulty sleeping, which he attributes to the nerve issues in his neck.  He has a history of prostate cancer, for which he underwent aggressive treatment resulting in incontinence. His father also had prostate cancer and passed away from it, while his mother passed away at the age of 70. He is currently 68 years old and notes that he has outlived his mother.          03/28/2024    9:38 AM 01/28/2024    9:07 AM 12/07/2023   10:03 AM  Depression screen PHQ 2/9  Decreased Interest 0 0 0  Down,  Depressed, Hopeless 0 0 0  PHQ - 2 Score 0 0 0  Altered sleeping   0  Tired, decreased energy   0  Change in appetite   0  Feeling bad or failure about yourself    0  Trouble concentrating   0  Moving slowly or fidgety/restless   0  Suicidal thoughts   0  PHQ-9 Score   0   Difficult doing work/chores   Not difficult at all     Data saved with a previous flowsheet row definition    History Adam Sutton has a past medical history of Bleeding hemorrhoid (10/27/2017), Cancer [facility]), Cervical disc disorder with radiculopathy, Cervical stenosis of spine, Diabetes mellitus without complication (HCC), Hypertension, Incontinence, Lipidemia, Nerve pain due to spinal stenosis, Sleep apnea (10/14/2011), and Vertigo.   He has a past surgical history that includes Umbilical hernia repair (2003); bilateral jaw surgery; Robot assisted laparoscopic radical prostatectomy (10/22/2011); Prostate surgery; Vasectomy; Tonsillectomy; Anterior cervical decomp/discectomy fusion (N/A, 04/13/2019); and Anterior cervical decomp/discectomy fusion (N/A, 12/14/2023).   His family history includes Cancer in his father, maternal grandmother, and mother; Diabetes in his father; Heart disease in his father; Hyperlipidemia in his father; Hypertension in his father.He reports that he has never smoked. He has never used smokeless tobacco. He reports current alcohol use. He reports that he does not use drugs.    ROS Review of Systems  Constitutional: Negative.   HENT: Negative.    Eyes:  Negative for visual disturbance.  Respiratory:  Negative for cough and shortness of breath.  Cardiovascular:  Negative for chest pain and leg swelling.  Gastrointestinal:  Negative for abdominal pain, diarrhea, nausea and vomiting.  Genitourinary:  Negative for difficulty urinating.  Musculoskeletal:  Negative for arthralgias and myalgias.  Skin:  Negative for rash.  Neurological:  Negative for headaches.  Psychiatric/Behavioral:  Negative  for sleep disturbance.     Objective:  BP 139/71   Pulse (!) 57   Temp 98 F (36.7 C)   Ht 5' 8 (1.727 m)   Wt 245 lb (111.1 kg)   SpO2 97%   BMI 37.25 kg/m   BP Readings from Last 3 Encounters:  05/02/24 139/71  03/28/24 (!) 156/90  01/28/24 137/82    Wt Readings from Last 3 Encounters:  05/02/24 245 lb (111.1 kg)  03/28/24 246 lb (111.6 kg)  01/28/24 240 lb 9.6 oz (109.1 kg)     Physical Exam Vitals reviewed.  Constitutional:      Appearance: He is well-developed.  HENT:     Head: Normocephalic and atraumatic.     Right Ear: External ear normal.     Left Ear: External ear normal.     Mouth/Throat:     Pharynx: No oropharyngeal exudate or posterior oropharyngeal erythema.  Eyes:     Pupils: Pupils are equal, round, and reactive to light.  Cardiovascular:     Rate and Rhythm: Normal rate and regular rhythm.     Heart sounds: No murmur heard. Pulmonary:     Effort: No respiratory distress.     Breath sounds: Normal breath sounds.  Musculoskeletal:     Cervical back: Normal range of motion and neck supple.  Neurological:     Mental Status: He is alert and oriented to person, place, and time.    Physical Exam VITALS: BP- 139/74 GENERAL: Alert, cooperative, well developed, no acute distress HEENT: Normocephalic, normal oropharynx, moist mucous membranes CHEST: Clear to auscultation bilaterally, No wheezes, rhonchi, or crackles CARDIOVASCULAR: Normal heart rate and rhythm, S1 and S2 normal without murmurs ABDOMEN: Soft, non-tender, non-distended, without organomegaly, Normal bowel sounds EXTREMITIES: No cyanosis or edema NEUROLOGICAL: Cranial nerves grossly intact, Moves all extremities without gross motor or sensory deficit   Assessment & Plan:  Mixed hyperlipidemia -     Comprehensive metabolic panel with GFR -     Lipid panel -     Fenofibrate ; Take 1 tablet (160 mg total) by mouth daily. For cholesterol and triglyceride  Dispense: 90 tablet; Refill:  3 -     Atorvastatin  Calcium ; Take 1 tablet (40 mg total) by mouth daily. for cholesterol.  Dispense: 90 tablet; Refill: 3  Diabetes mellitus without complication (HCC) -     Microalbumin / creatinine urine ratio -     Bayer DCA Hb A1c Waived -     metFORMIN  HCl ER; Take 1 tablet (750 mg total) by mouth 2 (two) times daily with a meal.  Dispense: 180 tablet; Refill: 3  BMI 37.0-37.9, adult -     CBC with Differential/Platelet -     Comprehensive metabolic panel with GFR  Hypertension associated with diabetes (HCC) -     CBC with Differential/Platelet -     Comprehensive metabolic panel with GFR  Essential hypertension -     Metoprolol  Tartrate; Take 1 tablet (100 mg total) by mouth 2 (two) times daily.  Dispense: 180 tablet; Refill: 3 -     Lisinopril -hydroCHLOROthiazide ; Take 1 tablet by mouth daily.  Dispense: 90 tablet; Refill: 3 -     amLODIPine  Besylate;  Take 1 tablet by mouth once daily for blood pressure  Dispense: 90 tablet; Refill: 3  Primary hypertension  Other orders -     SITagliptin  Phosphate; Take 1 tablet (100 mg total) by mouth daily.  Dispense: 90 tablet; Refill: 3    Assessment and Plan Assessment & Plan Type 2 diabetes mellitus, poorly controlled   Blood glucose levels are consistently elevated, with recent readings of 218 mg/dL and 790 mg/dL. Current medications, metformin  and glimepiride , are insufficient for glycemic control. Hemoglobin A1c is 8.3%, above the target of less than 7%. Prescribed Januvia  (sitagliptin ) in addition to current medications. Monitor blood glucose levels regularly and report fasting levels between 100-125 mg/dL. Will reassess medication regimen if blood glucose levels improve significantly.  Essential hypertension   Blood pressure is borderline at 139/74 mmHg. Current medications include metoprolol , amlodipine , lisinopril , and hydrochlorothiazide . Recommended purchasing an [employer] blood pressure monitor for home use and advised checking  blood pressure daily at a relaxed time.  Mixed hyperlipidemia   Current medications include atorvastatin  and fenofibrate .  Chronic pain and numbness, right arm and shoulder, post-cervical spine surgery   Persistent pain and numbness in the right arm and shoulder following cervical spine surgery. Previous surgeries included placement of nine titanium screws. Current management involves referral to a pain specialist for further evaluation and potential injection therapy. Continue follow-up with pain specialist for further management. Avoid prescribing additional pain medication to not interfere with pain specialist's treatment plan. Consider over-the-counter Tylenol  PM for pain management.       Follow-up: Return in about 3 months (around 08/02/2024) for diabetes.  Butler Der, M.D.

## 2024-05-03 ENCOUNTER — Other Ambulatory Visit (HOSPITAL_COMMUNITY): Payer: Self-pay

## 2024-05-03 ENCOUNTER — Telehealth: Payer: Self-pay | Admitting: Pharmacy Technician

## 2024-05-03 ENCOUNTER — Other Ambulatory Visit: Payer: Self-pay | Admitting: Family Medicine

## 2024-05-03 LAB — MICROALBUMIN / CREATININE URINE RATIO
Creatinine, Urine: 132.5 mg/dL
Microalb/Creat Ratio: 63 mg/g{creat} — ABNORMAL HIGH (ref 0–29)
Microalbumin, Urine: 83.1 ug/mL

## 2024-05-03 LAB — CBC WITH DIFFERENTIAL/PLATELET
Basophils Absolute: 0.1 x10E3/uL (ref 0.0–0.2)
Basos: 1 %
EOS (ABSOLUTE): 0.2 x10E3/uL (ref 0.0–0.4)
Eos: 2 %
Hematocrit: 47.1 % (ref 37.5–51.0)
Hemoglobin: 15.3 g/dL (ref 13.0–17.7)
Immature Grans (Abs): 0 x10E3/uL (ref 0.0–0.1)
Immature Granulocytes: 0 %
Lymphocytes Absolute: 2.1 x10E3/uL (ref 0.7–3.1)
Lymphs: 28 %
MCH: 27.7 pg (ref 26.6–33.0)
MCHC: 32.5 g/dL (ref 31.5–35.7)
MCV: 85 fL (ref 79–97)
Monocytes Absolute: 0.6 x10E3/uL (ref 0.1–0.9)
Monocytes: 9 %
Neutrophils Absolute: 4.4 x10E3/uL (ref 1.4–7.0)
Neutrophils: 60 %
Platelets: 338 x10E3/uL (ref 150–450)
RBC: 5.53 x10E6/uL (ref 4.14–5.80)
RDW: 14.1 % (ref 11.6–15.4)
WBC: 7.4 x10E3/uL (ref 3.4–10.8)

## 2024-05-03 LAB — LIPID PANEL
Cholesterol, Total: 154 mg/dL (ref 100–199)
HDL: 28 mg/dL — AB (ref >39)
LDL CALC COMMENT:: 5.5 ratio — AB (ref 0.0–5.0)
LDL Chol Calc (NIH): 70 mg/dL (ref 0–99)
Triglycerides: 355 mg/dL — AB (ref 0–149)
VLDL Cholesterol Cal: 56 mg/dL — AB (ref 5–40)

## 2024-05-03 LAB — COMPREHENSIVE METABOLIC PANEL WITH GFR
ALT: 20 IU/L (ref 0–44)
AST: 18 IU/L (ref 0–40)
Albumin: 4.2 g/dL (ref 3.9–4.9)
Alkaline Phosphatase: 104 IU/L (ref 47–123)
BUN/Creatinine Ratio: 18 (ref 10–24)
BUN: 17 mg/dL (ref 8–27)
Bilirubin Total: 0.8 mg/dL (ref 0.0–1.2)
CO2: 27 mmol/L (ref 20–29)
Calcium: 9.8 mg/dL (ref 8.6–10.2)
Chloride: 93 mmol/L — AB (ref 96–106)
Creatinine, Ser: 0.93 mg/dL (ref 0.76–1.27)
Globulin, Total: 2.9 g/dL (ref 1.5–4.5)
Glucose: 196 mg/dL — AB (ref 70–99)
Potassium: 4.1 mmol/L (ref 3.5–5.2)
Sodium: 135 mmol/L (ref 134–144)
Total Protein: 7.1 g/dL (ref 6.0–8.5)
eGFR: 88 mL/min/1.73 (ref >59)

## 2024-05-03 MED ORDER — SAXAGLIPTIN HCL 5 MG PO TABS: 5.0000 mg | ORAL_TABLET | Freq: Every day | ORAL | 1 refills | Status: AC

## 2024-05-03 NOTE — Telephone Encounter (Signed)
 Pharmacy Patient Advocate Encounter   Received notification from Onbase that prior authorization for Januvia  100 mg tablet is required/requested.   Insurance verification completed.   The patient is insured through [facility].   Per test claim:  ONGLYZA and TRADJENTA is preferred by the insurance.  If suggested medication is appropriate, Please send in a new RX and discontinue this one. If not, please advise as to why it's not appropriate so that we may request a Prior Authorization. Please note, some preferred medications may still require a PA.  If the suggested medications have not been trialed and there are no contraindications to their use, the PA will not be submitted, as it will not be approved.   **I saw where patient was on Tradjenta in the past but I need documentation of reason why it was discontinued**  Thanks!

## 2024-05-03 NOTE — Telephone Encounter (Signed)
 Let him know I sent in a substitute. Just as good, and due to insurance, much cheaper

## 2024-05-03 NOTE — Telephone Encounter (Signed)
 Pt informed    LS

## 2024-05-08 ENCOUNTER — Other Ambulatory Visit: Payer: Self-pay | Admitting: Family Medicine

## 2024-05-08 MED ORDER — ICOSAPENT ETHYL 1 G PO CAPS: 2.0000 g | ORAL_CAPSULE | Freq: Two times a day (BID) | ORAL | 3 refills | Status: AC

## 2024-05-11 NOTE — Progress Notes (Signed)
 Adam Sutton                                          MRN: [id number]   05/11/2024   The [facility] Quality Team Specialist reviewed this patient medical record for the purposes of chart review for care gap closure. The following were reviewed: abstraction for care gap closure-kidney health evaluation for diabetes:eGFR  and uACR.    [facility] Quality Team

## 2024-08-02 ENCOUNTER — Ambulatory Visit: Admitting: Family Medicine
